# Patient Record
Sex: Female | Born: 1965 | Race: White | Hispanic: No | Marital: Married | Smoking: Never smoker
Health system: Southern US, Community
[De-identification: ages and names within clinical notes are randomized; demographics above are authoritative.]

## PROBLEM LIST (undated history)

## (undated) DIAGNOSIS — Z87442 Personal history of urinary calculi: Secondary | ICD-10-CM

## (undated) DIAGNOSIS — T753XXA Motion sickness, initial encounter: Secondary | ICD-10-CM

## (undated) DIAGNOSIS — O24419 Gestational diabetes mellitus in pregnancy, unspecified control: Secondary | ICD-10-CM

## (undated) DIAGNOSIS — E785 Hyperlipidemia, unspecified: Secondary | ICD-10-CM

## (undated) DIAGNOSIS — H905 Unspecified sensorineural hearing loss: Secondary | ICD-10-CM

## (undated) DIAGNOSIS — N200 Calculus of kidney: Secondary | ICD-10-CM

## (undated) DIAGNOSIS — K5792 Diverticulitis of intestine, part unspecified, without perforation or abscess without bleeding: Secondary | ICD-10-CM

## (undated) DIAGNOSIS — T8859XA Other complications of anesthesia, initial encounter: Secondary | ICD-10-CM

## (undated) DIAGNOSIS — T4145XA Adverse effect of unspecified anesthetic, initial encounter: Secondary | ICD-10-CM

## (undated) DIAGNOSIS — J189 Pneumonia, unspecified organism: Secondary | ICD-10-CM

## (undated) DIAGNOSIS — G43909 Migraine, unspecified, not intractable, without status migrainosus: Secondary | ICD-10-CM

## (undated) DIAGNOSIS — R011 Cardiac murmur, unspecified: Secondary | ICD-10-CM

## (undated) DIAGNOSIS — K760 Fatty (change of) liver, not elsewhere classified: Secondary | ICD-10-CM

## (undated) HISTORY — PX: COLON RESECTION SIGMOID: SHX6737

## (undated) HISTORY — DX: Gestational diabetes mellitus in pregnancy, unspecified control: O24.419

## (undated) HISTORY — PX: TUBAL LIGATION: SHX77

## (undated) HISTORY — DX: Calculus of kidney: N20.0

## (undated) HISTORY — DX: Migraine, unspecified, not intractable, without status migrainosus: G43.909

## (undated) HISTORY — PX: LUMBAR EPIDURAL INJECTION: SHX1980

## (undated) HISTORY — DX: Hyperlipidemia, unspecified: E78.5

---

## 1985-08-23 HISTORY — PX: DIAGNOSTIC LAPAROSCOPY: SUR761

## 1986-08-23 HISTORY — PX: DIAGNOSTIC LAPAROSCOPY: SUR761

## 1996-08-23 HISTORY — PX: SINUS EXPLORATION: SHX5214

## 2002-08-23 HISTORY — PX: COLONOSCOPY: SHX174

## 2005-07-16 ENCOUNTER — Ambulatory Visit: Payer: Self-pay | Admitting: *Deleted

## 2015-04-04 ENCOUNTER — Emergency Department: Payer: Self-pay

## 2015-04-04 ENCOUNTER — Emergency Department
Admission: EM | Admit: 2015-04-04 | Discharge: 2015-04-04 | Disposition: A | Discharge status: Home / Self Care | Payer: Self-pay | Attending: Emergency Medicine | Admitting: Emergency Medicine

## 2015-04-04 ENCOUNTER — Encounter: Payer: Self-pay | Admitting: Emergency Medicine

## 2015-04-04 DIAGNOSIS — K5732 Diverticulitis of large intestine without perforation or abscess without bleeding: Secondary | ICD-10-CM | POA: Insufficient documentation

## 2015-04-04 HISTORY — DX: Diverticulitis of intestine, part unspecified, without perforation or abscess without bleeding: K57.92

## 2015-04-04 LAB — CBC
HEMATOCRIT: 39.8 % (ref 35.0–47.0)
Hemoglobin: 13.5 g/dL (ref 12.0–16.0)
MCH: 30.6 pg (ref 26.0–34.0)
MCHC: 34 g/dL (ref 32.0–36.0)
MCV: 90.1 fL (ref 80.0–100.0)
Platelets: 191 10*3/uL (ref 150–440)
RBC: 4.42 MIL/uL (ref 3.80–5.20)
RDW: 13.2 % (ref 11.5–14.5)
WBC: 10.2 10*3/uL (ref 3.6–11.0)

## 2015-04-04 LAB — COMPREHENSIVE METABOLIC PANEL
ALK PHOS: 50 U/L (ref 38–126)
ALT: 16 U/L (ref 14–54)
ANION GAP: 9 (ref 5–15)
AST: 21 U/L (ref 15–41)
Albumin: 4 g/dL (ref 3.5–5.0)
BUN: 15 mg/dL (ref 6–20)
CHLORIDE: 106 mmol/L (ref 101–111)
CO2: 24 mmol/L (ref 22–32)
Calcium: 9.3 mg/dL (ref 8.9–10.3)
Creatinine, Ser: 0.81 mg/dL (ref 0.44–1.00)
GFR calc Af Amer: >60 mL/min (ref >60)
GFR calc non Af Amer: >60 mL/min (ref >60)
Glucose, Bld: 102 mg/dL — ABNORMAL HIGH (ref 65–99)
Potassium: 4.3 mmol/L (ref 3.5–5.1)
Sodium: 139 mmol/L (ref 135–145)
Total Bilirubin: 0.6 mg/dL (ref 0.3–1.2)
Total Protein: 7.3 g/dL (ref 6.5–8.1)

## 2015-04-04 LAB — LIPASE, BLOOD: Lipase: 29 U/L (ref 22–51)

## 2015-04-04 MED ORDER — METRONIDAZOLE 500 MG PO TABS: 500.0000 mg | ORAL_TABLET | Freq: Two times a day (BID) | ORAL | Status: DC

## 2015-04-04 MED ORDER — MORPHINE SULFATE 4 MG/ML IJ SOLN
4.0000 mg | Freq: Once | INTRAMUSCULAR | Status: AC
  Administered 2015-04-04: 4 mg via INTRAVENOUS
  Filled 2015-04-04: qty 1

## 2015-04-04 MED ORDER — CIPROFLOXACIN HCL 500 MG PO TABS: 500.0000 mg | ORAL_TABLET | Freq: Two times a day (BID) | ORAL | Status: DC

## 2015-04-04 MED ORDER — IOHEXOL 350 MG/ML SOLN
100.0000 mL | Freq: Once | INTRAVENOUS | Status: AC | PRN
  Administered 2015-04-04: 100 mL via INTRAVENOUS

## 2015-04-04 MED ORDER — MORPHINE SULFATE 4 MG/ML IJ SOLN
INTRAMUSCULAR | Status: AC
  Administered 2015-04-04: 4 mg via INTRAVENOUS
  Filled 2015-04-04: qty 1

## 2015-04-04 MED ORDER — IOHEXOL 240 MG/ML SOLN
25.0000 mL | Freq: Once | INTRAMUSCULAR | Status: AC | PRN
  Administered 2015-04-04: 25 mL via ORAL

## 2015-04-04 MED ORDER — SODIUM CHLORIDE 0.9 % IV SOLN
1000.0000 mL | Freq: Once | INTRAVENOUS | Status: AC
  Administered 2015-04-04: 1000 mL via INTRAVENOUS

## 2015-04-04 MED ORDER — OXYCODONE-ACETAMINOPHEN 5-325 MG PO TABS: 1.0000 | ORAL_TABLET | Freq: Four times a day (QID) | ORAL | Status: AC | PRN

## 2015-04-04 MED ORDER — MORPHINE SULFATE 4 MG/ML IJ SOLN
4.0000 mg | Freq: Once | INTRAMUSCULAR | Status: AC
  Administered 2015-04-04: 4 mg via INTRAVENOUS

## 2015-04-04 MED ORDER — ONDANSETRON HCL 4 MG/2ML IJ SOLN
4.0000 mg | Freq: Once | INTRAMUSCULAR | Status: AC
  Administered 2015-04-04: 4 mg via INTRAVENOUS
  Filled 2015-04-04: qty 2

## 2015-04-04 NOTE — ED Notes (Signed)
MD at bedside. 

## 2015-04-04 NOTE — Discharge Instructions (Signed)
Diverticulosis Diverticulosis is the condition that develops when small pouches (diverticula) form in the wall of your colon. Your colon, or large intestine, is where water is absorbed and stool is formed. The pouches form when the inside layer of your colon pushes through weak spots in the outer layers of your colon. CAUSES  No one knows exactly what causes diverticulosis. RISK FACTORS  Being older than 50. Your risk for this condition increases with age. Diverticulosis is rare in people younger than 40 years. By age 80, almost everyone has it.  Eating a low-fiber diet.  Being frequently constipated.  Being overweight.  Not getting enough exercise.  Smoking.  Taking over-the-counter pain medicines, like aspirin and ibuprofen. SYMPTOMS  Most people with diverticulosis do not have symptoms. DIAGNOSIS  Because diverticulosis often has no symptoms, health care providers often discover the condition during an exam for other colon problems. In many cases, a health care provider will diagnose diverticulosis while using a flexible scope to examine the colon (colonoscopy). TREATMENT  If you have never developed an infection related to diverticulosis, you may not need treatment. If you have had an infection before, treatment may include:  Eating more fruits, vegetables, and grains.  Taking a fiber supplement.  Taking a live bacteria supplement (probiotic).  Taking medicine to relax your colon. HOME CARE INSTRUCTIONS   Drink at least 6-8 glasses of water each day to prevent constipation.  Try not to strain when you have a bowel movement.  Keep all follow-up appointments. If you have had an infection before:  Increase the fiber in your diet as directed by your health care provider or dietitian.  Take a dietary fiber supplement if your health care provider approves.  Only take medicines as directed by your health care provider. SEEK MEDICAL CARE IF:   You have abdominal  pain.  You have bloating.  You have cramps.  You have not gone to the bathroom in 3 days. SEEK IMMEDIATE MEDICAL CARE IF:   Your pain gets worse.  Yourbloating becomes very bad.  You have a fever or chills, and your symptoms suddenly get worse.  You begin vomiting.  You have bowel movements that are bloody or black. MAKE SURE YOU:  Understand these instructions.  Will watch your condition.  Will get help right away if you are not doing well or get worse. Document Released: 05/06/2004 Document Revised: 08/14/2013 Document Reviewed: 07/04/2013 ExitCare Patient Information 2015 [employer]. This information is not intended to replace advice given to you by your health care provider. Make sure you discuss any questions you have with your health care provider.  

## 2015-04-04 NOTE — ED Notes (Signed)
Pt to ed with c/o left lower abd pain x several days.  Pt states hx of diverticulitis.  Pt states last BM was today.  Denies n/v.

## 2015-04-04 NOTE — ED Provider Notes (Signed)
[facility] Emergency Department Provider Note  ____________________________________________  Time seen: On arrival  I have reviewed the triage vital signs and the nursing notes.   HISTORY  Chief Complaint Abdominal Pain    HPI Deborah Brandt is a 49 y.o. female who presents with abdominal pain. She reports severe cramping left lower abdominal pain which feels consistent with diverticulitis which she has had the past. Last episode was this was about 7 years ago although she does note that it seemed to flareup in January of this year but improved with antibiotics to some degree.She denies fevers chills. No dysuria. No back pain or flank pain. No nausea no vomiting. Normal bowel movement today.     Past Medical History  Diagnosis Date  . Diverticulitis     There are no active problems to display for this patient.   History reviewed. No pertinent past surgical history.  Current Outpatient Rx  Name  Route  Sig  Dispense  Refill  . ciprofloxacin (CIPRO) 500 MG tablet   Oral   Take 1 tablet (500 mg total) by mouth 2 (two) times daily.   14 tablet   0   . metroNIDAZOLE (FLAGYL) 500 MG tablet   Oral   Take 1 tablet (500 mg total) by mouth 2 (two) times daily after a meal.   14 tablet   0   . oxyCODONE-acetaminophen (ROXICET) 5-325 MG per tablet   Oral   Take 1 tablet by mouth every 6 (six) hours as needed.   30 tablet   0     Allergies Levaquin; Codeine; Doxycycline; Erythromycin; and Sulfa antibiotics  History reviewed. No pertinent family history.  Social History Social History  Substance Use Topics  . Smoking status: Never Smoker   . Smokeless tobacco: None  . Alcohol Use: Yes    Review of Systems  Constitutional: Negative for fever. Eyes: Negative for visual changes. ENT: Negative for sore throat Cardiovascular: Negative for chest pain. Respiratory: Negative for shortness of breath. Gastrointestinal: Positive for abdominal  pain, negative for diarrhea Genitourinary: Negative for dysuria. Musculoskeletal: Negative for back pain. Skin: Negative for rash. Neurological: Negative for headaches Psychiatric: No anxiety    ____________________________________________   PHYSICAL EXAM:  VITAL SIGNS: ED Triage Vitals  Enc Vitals Group     BP 04/04/15 0720 126/88 mmHg     Pulse Rate 04/04/15 0720 100     Resp 04/04/15 0720 20     Temp 04/04/15 0720 98.4 F (36.9 C)     Temp Source 04/04/15 0720 Oral     SpO2 04/04/15 0720 99 %     Weight 04/04/15 0720 180 lb (81.647 kg)     Height 04/04/15 0720 5' (1.524 m)     Head Cir --      Peak Flow --      Pain Score 04/04/15 0721 8     Pain Loc --      Pain Edu? --      Excl. in [place]? --      Constitutional: Alert and oriented. Well appearing and in no distress. Pleasant and interactive Eyes: Conjunctivae are normal.  ENT   Head: Normocephalic and atraumatic.   Mouth/Throat: Mucous membranes are moist. Cardiovascular: Normal rate, regular rhythm. Normal and symmetric distal pulses are present in all extremities.  Respiratory: Normal respiratory effort without tachypnea nor retractions. Breath sounds are clear and equal bilaterally.  Gastrointestinal: Moderate Tenderness to palpation in the left lower quadrant. No distention. There is no  CVA tenderness. Genitourinary: deferred Musculoskeletal: Nontender with normal range of motion in all extremities. No lower extremity tenderness nor edema. Neurologic:  Normal speech and language. No gross focal neurologic deficits are appreciated. Skin:  Skin is warm, dry and intact. No rash noted. Psychiatric: Mood and affect are normal. Patient exhibits appropriate insight and judgment.  ____________________________________________    LABS (pertinent positives/negatives)  Labs Reviewed  COMPREHENSIVE METABOLIC PANEL - Abnormal; Notable for the following:    Glucose, Bld 102 (*)    All other components within  normal limits  CBC  LIPASE, BLOOD    ____________________________________________   EKG  None  ____________________________________________    RADIOLOGY I have personally reviewed any xrays that were ordered on this patient: CT scan shows acute diverticulitis, no abscess or perforation noted  ____________________________________________   PROCEDURES  Procedure(s) performed: none  Critical Care performed: none  ____________________________________________   INITIAL IMPRESSION / ASSESSMENT AND PLAN / ED COURSE  Pertinent labs & imaging results that were available during my care of the patient were reviewed by me and considered in my medical decision making (see chart for details).  Patient with history of present illness and exam consistent with diverticulitis. We will give IV morphine, IV Zofran, normal saline 1 L. We'll check labs. and obtain a CT scan of her abdomen  ----------------------------------------- 10:42 AM on 04/04/2015 -----------------------------------------  Discussed CT results with patient. I suggested admission given her continued pain after IV pain medications but she has refused and wants to go home because she does not have insurance and does not want to get a bill from the hospital. She prefers to try by mouth antibiotics and pain medication. Given her labs and vitals are normal I believe this is a reasonable approach. I discussed return precautions with her at length  ____________________________________________   FINAL CLINICAL IMPRESSION(S) / ED DIAGNOSES  Final diagnoses:  Diverticulitis of large intestine without perforation or abscess without bleeding     Lavonia Drafts, MD 04/04/15 1043

## 2016-11-23 ENCOUNTER — Encounter: Payer: Self-pay | Admitting: Family Medicine

## 2016-11-23 ENCOUNTER — Ambulatory Visit (INDEPENDENT_AMBULATORY_CARE_PROVIDER_SITE_OTHER): Payer: BLUE CROSS/BLUE SHIELD | Admitting: Family Medicine

## 2016-11-23 VITALS — BP 124/80 | HR 90 | Temp 98.7°F | Ht 59.5 in | Wt 203.5 lb

## 2016-11-23 DIAGNOSIS — Z8632 Personal history of gestational diabetes: Secondary | ICD-10-CM

## 2016-11-23 DIAGNOSIS — K5732 Diverticulitis of large intestine without perforation or abscess without bleeding: Secondary | ICD-10-CM | POA: Diagnosis not present

## 2016-11-23 DIAGNOSIS — Z7189 Other specified counseling: Secondary | ICD-10-CM

## 2016-11-23 DIAGNOSIS — Z780 Asymptomatic menopausal state: Secondary | ICD-10-CM

## 2016-11-23 DIAGNOSIS — N200 Calculus of kidney: Secondary | ICD-10-CM

## 2016-11-23 DIAGNOSIS — Z8669 Personal history of other diseases of the nervous system and sense organs: Secondary | ICD-10-CM | POA: Diagnosis not present

## 2016-11-23 DIAGNOSIS — H00019 Hordeolum externum unspecified eye, unspecified eyelid: Secondary | ICD-10-CM

## 2016-11-23 NOTE — Progress Notes (Signed)
New patient.    Requesting records.  Will address health maintenance at that point.  D/w pt.  She agrees.    Skin tags on neck, prev with multiple tags having been removed. She wanted to know we can set this up at some point, for repeated treatment. They tend to get caught on clothing and get irritated.  Left eyelid changes. No vision loss, no discharge, no eye pain. Left lower eyelid laterally irritated. Not responding to over-the-counter drops. No right or eyelid changes.  No fevers chills throwing up diarrhea, etc. She does not feel unwell. No foreign body. No eye trauma.  No dysuria but she has noticed a change in the scent of her urine since she has gone through menopause. She has no discharge. No pain with urination. She previously had some hot flashes but they are improved now.  Multiple episodes of diverticulitis in the past. No current symptoms. She previously required hospitalization twice. Will review old records.  History of kidney stones. Patient was unaware of this; previously seen, nonobstructing, on previous imaging. This was an incidental finding and she has never had symptoms from kidney stones. Discussed with patient.  History of gestational diabetes but no current issues.  She had history of heart murmur in childhood but this is evidently resolve.  History of migraines but no recent symptoms. She has not had significant headaches in years.  Previous testing done for HIV. Negative per patient report.  Advance directive discussed with patient. Boyfriend Clent Demark designated if patient were incapacitated.  PMH and SH reviewed  ROS: Per HPI unless specifically indicated in ROS section   Meds, vitals, and allergies reviewed.   GEN: nad, alert and oriented HEENT: mucous membranes moist NECK: supple w/o LA CV: rrr.  no murmur PULM: ctab, no inc wob ABD: soft, +bs EXT: no edema PERRL EOMI, conjunctiva within normal limits bilaterally. No foreign body seen. Eyelids  within normal limits except for what looks to be a very small stye on the left lower lateral eyelid.

## 2016-11-23 NOTE — Patient Instructions (Signed)
Check with the front about a designated party release.   Warm compresses in the meantime on the left eyelid.  Update me as needed, especially if not better.   We'll get your old records and go from there.  Take care.  Glad to see you.

## 2016-11-23 NOTE — Progress Notes (Signed)
Pre visit review using our clinic review tool, if applicable. No additional management support is needed unless otherwise documented below in the visit note. 

## 2016-11-24 ENCOUNTER — Encounter: Payer: Self-pay | Admitting: Family Medicine

## 2016-11-24 DIAGNOSIS — Z7189 Other specified counseling: Secondary | ICD-10-CM | POA: Insufficient documentation

## 2016-11-24 DIAGNOSIS — K5792 Diverticulitis of intestine, part unspecified, without perforation or abscess without bleeding: Secondary | ICD-10-CM | POA: Insufficient documentation

## 2016-11-24 DIAGNOSIS — Z8669 Personal history of other diseases of the nervous system and sense organs: Secondary | ICD-10-CM | POA: Insufficient documentation

## 2016-11-24 DIAGNOSIS — Z780 Asymptomatic menopausal state: Secondary | ICD-10-CM | POA: Insufficient documentation

## 2016-11-24 DIAGNOSIS — N200 Calculus of kidney: Secondary | ICD-10-CM | POA: Insufficient documentation

## 2016-11-24 DIAGNOSIS — Z8632 Personal history of gestational diabetes: Secondary | ICD-10-CM | POA: Insufficient documentation

## 2016-11-24 DIAGNOSIS — H00019 Hordeolum externum unspecified eye, unspecified eyelid: Secondary | ICD-10-CM | POA: Insufficient documentation

## 2016-11-24 NOTE — Assessment & Plan Note (Signed)
Previously incidentally noted. Discussed with patient about previous findings. No symptoms now. No intervention needed.

## 2016-11-24 NOTE — Assessment & Plan Note (Signed)
I flashes or some better. In the absence no frank dysuria I would not work this up further. She agrees. Update me as needed.

## 2016-11-24 NOTE — Assessment & Plan Note (Signed)
Fortunately no recent symptoms.

## 2016-11-24 NOTE — Assessment & Plan Note (Signed)
History of. Will review old records. No current symptoms

## 2016-11-24 NOTE — Assessment & Plan Note (Signed)
Advance directive discussed with patient. Boyfriend Clent Demark designated if patient were incapacitated.

## 2016-11-24 NOTE — Assessment & Plan Note (Addendum)
Should resolve with warm compresses. I showed patient representative pictures of similar. Should resolve. Update me as needed. She agrees. >30 minutes spent in face to face time with patient, >50% spent in counselling or coordination of care.

## 2016-12-12 ENCOUNTER — Telehealth: Payer: Self-pay | Admitting: Family Medicine

## 2016-12-12 ENCOUNTER — Encounter: Payer: Self-pay | Admitting: Family Medicine

## 2016-12-12 DIAGNOSIS — E785 Hyperlipidemia, unspecified: Secondary | ICD-10-CM

## 2016-12-12 NOTE — Telephone Encounter (Signed)
Notify pt.  Old records from [facility] reviewed.  It doesn't have any labs/pap/health maintenance listed, but it does mention h/o HLD and prev lipitor use.  Would get labs done (ordered) prior to CPE and we'll go from there, when convenient for patient.  Thanks.

## 2016-12-13 NOTE — Telephone Encounter (Signed)
Left detailed message on voicemail.  

## 2016-12-14 NOTE — Telephone Encounter (Signed)
Pt returned your call. She has labs setup 6/12 before cpe. Please call if any questions.

## 2017-01-04 ENCOUNTER — Ambulatory Visit (INDEPENDENT_AMBULATORY_CARE_PROVIDER_SITE_OTHER): Payer: BLUE CROSS/BLUE SHIELD | Admitting: Family Medicine

## 2017-01-04 ENCOUNTER — Encounter: Payer: Self-pay | Admitting: Family Medicine

## 2017-01-04 VITALS — BP 130/76 | HR 85 | Temp 98.7°F | Wt 208.0 lb

## 2017-01-04 DIAGNOSIS — K5792 Diverticulitis of intestine, part unspecified, without perforation or abscess without bleeding: Secondary | ICD-10-CM

## 2017-01-04 LAB — CBC WITH DIFFERENTIAL/PLATELET
BASOS ABS: 0 10*3/uL (ref 0.0–0.1)
Basophils Relative: 0.4 % (ref 0.0–3.0)
Eosinophils Absolute: 0.2 10*3/uL (ref 0.0–0.7)
Eosinophils Relative: 1.8 % (ref 0.0–5.0)
HEMATOCRIT: 40.2 % (ref 36.0–46.0)
Hemoglobin: 13.7 g/dL (ref 12.0–15.0)
LYMPHS PCT: 30.9 % (ref 12.0–46.0)
Lymphs Abs: 2.9 10*3/uL (ref 0.7–4.0)
MCHC: 34 g/dL (ref 30.0–36.0)
MCV: 91.5 fl (ref 78.0–100.0)
MONOS PCT: 7.6 % (ref 3.0–12.0)
Monocytes Absolute: 0.7 10*3/uL (ref 0.1–1.0)
Neutro Abs: 5.5 10*3/uL (ref 1.4–7.7)
Neutrophils Relative %: 59.3 % (ref 43.0–77.0)
PLATELETS: 224 10*3/uL (ref 150.0–400.0)
RBC: 4.39 Mil/uL (ref 3.87–5.11)
RDW: 13.1 % (ref 11.5–15.5)
WBC: 9.3 10*3/uL (ref 4.0–10.5)

## 2017-01-04 MED ORDER — AMOXICILLIN-POT CLAVULANATE 875-125 MG PO TABS: 1.0000 | ORAL_TABLET | Freq: Two times a day (BID) | ORAL | 0 refills | Status: DC

## 2017-01-04 NOTE — Progress Notes (Signed)
H/o mult flares of diverticulitis.  Prev hospitalized for flares.  No sx in the last year.  She was fine until yesterday AM, woke up with sx.  No better today.  Lower abd pain. Feels like prev episodes.  6-7/10.  No fevers but felt hot.  LLQ pain.  No R sided pain.  No vomiting, no diarrhea. No blood in stool.  Normal BM this AM.  No dysuria.  Last UOP was ~30 min ago, normal.  No h/o sx from renal stones.    Prev CT noted from 2016, d/w pt:  IMPRESSION: 1. Acute diverticulitis of the descending colon-sigmoid colon junction. No abcess. Given patients age, recommend colonoscopy following resolution of the patients acute illness if the patient has not already had one. 2. Bilateral nonobstructing nephrolithiasis.  Meds, vitals, and allergies reviewed.   ROS: Per HPI unless specifically indicated in ROS section   nad ncat Mmm Neck supple, no LA rrr ctab abd soft, not ttp except ttp in LLQ w/o rebound, normal BS Ext w/o edema

## 2017-01-04 NOTE — Patient Instructions (Signed)
Go to the lab on the way out.  We'll contact you with your lab report. Start augmentin today.   If severe pain then go to the ER.  Clear liquids for now.  Update me tomorrow.  Take care.  Glad to see you.

## 2017-01-04 NOTE — Assessment & Plan Note (Signed)
Likely dx, check cbc, start augmentin, to ER if worse.  Clear liquid diet, update me re: condition tomorrow.  Still okay for outpatient f/u.  She agrees.

## 2017-01-20 ENCOUNTER — Telehealth: Payer: Self-pay

## 2017-01-20 NOTE — Telephone Encounter (Signed)
Can she come in tomorrow for re evaluation? Let me know if not.

## 2017-01-20 NOTE — Telephone Encounter (Signed)
Pt left v/m; pt seen 01/04/17; pt finished med 1 week ago and pt is still having pain/ pt wants to know what to do next; if pt needs appt she will schedule [email or web address] request cb.

## 2017-01-21 ENCOUNTER — Encounter: Payer: Self-pay | Admitting: Family Medicine

## 2017-01-21 ENCOUNTER — Ambulatory Visit
Admission: RE | Admit: 2017-01-21 | Discharge: 2017-01-21 | Disposition: A | Discharge status: Home / Self Care | Payer: BLUE CROSS/BLUE SHIELD | Source: Ambulatory Visit | Attending: Family Medicine | Admitting: Family Medicine

## 2017-01-21 ENCOUNTER — Ambulatory Visit (INDEPENDENT_AMBULATORY_CARE_PROVIDER_SITE_OTHER): Payer: BLUE CROSS/BLUE SHIELD | Admitting: Family Medicine

## 2017-01-21 ENCOUNTER — Telehealth: Payer: Self-pay | Admitting: Family Medicine

## 2017-01-21 VITALS — BP 130/82 | HR 66 | Temp 98.2°F | Wt 199.0 lb

## 2017-01-21 DIAGNOSIS — N200 Calculus of kidney: Secondary | ICD-10-CM | POA: Diagnosis not present

## 2017-01-21 DIAGNOSIS — K5792 Diverticulitis of intestine, part unspecified, without perforation or abscess without bleeding: Secondary | ICD-10-CM | POA: Insufficient documentation

## 2017-01-21 DIAGNOSIS — R1032 Left lower quadrant pain: Secondary | ICD-10-CM | POA: Diagnosis not present

## 2017-01-21 DIAGNOSIS — I700 Atherosclerosis of aorta: Secondary | ICD-10-CM | POA: Insufficient documentation

## 2017-01-21 MED ORDER — IOPAMIDOL (ISOVUE-300) INJECTION 61%
100.0000 mL | Freq: Once | INTRAVENOUS | Status: AC | PRN
  Administered 2017-01-21: 100 mL via INTRAVENOUS

## 2017-01-21 NOTE — Progress Notes (Signed)
BP 130/82   Pulse 66   Temp 98.2 F (36.8 C) (Oral)   Wt 199 lb (90.3 kg)   SpO2 98%   BMI 39.52 kg/m    CC: f/u abd pain Subjective:    Patient ID: Deborah Brandt, female    DOB: 05-May-1966, 51 y.o.   MRN: [id number]  HPI: Deborah Brandt is a 51 y.o. female presenting on 01/21/2017 for Follow-up   See prior note for details. Seen by Dr Damita Dunnings 01/04/2017 with concern for early diverticulitis, treated with augmentin course. CBC WNL at that time. She did feel better but never fully better. Yesterday had acute flare again - intermittent sharp "rolling" pain LLQ (6-7/10 pain). No fevers, bowel changes, nausea, blood in stool. No urinary symptoms of dysuria, urgency, hematuria.   She has changed diet to bland.  Currently not taking fiber.   H/o prior diverticulitis flares, needing hospitalization in the past.  No h/o kidney stones.  Hive allergy to levaquin.  She has had colonoscopy 6-7 yrs ago at [facility] hospital in [place] after first diverticulitis flrae - states overall normal.   Relevant past medical, surgical, family and social history reviewed and updated as indicated. Interim medical history since our last visit reviewed. Allergies and medications reviewed and updated. Outpatient Medications Prior to Visit  Medication Sig Dispense Refill  . FIBER PO Take by mouth daily.    . Multiple Vitamin (MULTIVITAMIN) tablet Take 1 tablet by mouth daily.    . Probiotic Product (PROBIOTIC DAILY PO) Take by mouth daily.    Marland Kitchen amoxicillin-clavulanate (AUGMENTIN) 875-125 MG tablet Take 1 tablet by mouth 2 (two) times daily. 20 tablet 0   No facility-administered medications prior to visit.      Per HPI unless specifically indicated in ROS section below Review of Systems     Objective:    BP 130/82   Pulse 66   Temp 98.2 F (36.8 C) (Oral)   Wt 199 lb (90.3 kg)   SpO2 98%   BMI 39.52 kg/m   Wt Readings from Last 3 Encounters:  01/21/17 199 lb (90.3 kg)  01/04/17 208 lb (94.3 kg)    11/23/16 203 lb 8 oz (92.3 kg)    Physical Exam  Constitutional: She appears well-developed and well-nourished. No distress.  HENT:  Mouth/Throat: Oropharynx is clear and moist. No oropharyngeal exudate.  Cardiovascular: Normal rate, regular rhythm, normal heart sounds and intact distal pulses.   No murmur heard. Pulmonary/Chest: Effort normal and breath sounds normal. No respiratory distress. She has no wheezes. She has no rales.  Abdominal: Soft. Normal appearance and bowel sounds are normal. She exhibits no distension and no mass. There is no hepatosplenomegaly. There is tenderness (mild-mod) in the epigastric area, suprapubic area and left lower quadrant. There is CVA tenderness (mild L flank). There is no rigidity, no rebound, no guarding and negative Murphy's sign.  Skin: Skin is warm and dry. No rash noted.  Psychiatric: She has a normal mood and affect.  Nursing note and vitals reviewed.  Results for orders placed or performed in visit on 01/04/17  CBC with Differential/Platelet  Result Value Ref Range   WBC 9.3 4.0 - 10.5 K/uL   RBC 4.39 3.87 - 5.11 Mil/uL   Hemoglobin 13.7 12.0 - 15.0 g/dL   HCT 40.2 36.0 - 46.0 %   MCV 91.5 78.0 - 100.0 fl   MCHC 34.0 30.0 - 36.0 g/dL   RDW 13.1 11.5 - 15.5 %   Platelets 224.0 150.0 -  400.0 K/uL   Neutrophils Relative % 59.3 43.0 - 77.0 %   Lymphocytes Relative 30.9 12.0 - 46.0 %   Monocytes Relative 7.6 3.0 - 12.0 %   Eosinophils Relative 1.8 0.0 - 5.0 %   Basophils Relative 0.4 0.0 - 3.0 %   Neutro Abs 5.5 1.4 - 7.7 K/uL   Lymphs Abs 2.9 0.7 - 4.0 K/uL   Monocytes Absolute 0.7 0.1 - 1.0 K/uL   Eosinophils Absolute 0.2 0.0 - 0.7 K/uL   Basophils Absolute 0.0 0.0 - 0.1 K/uL      Assessment & Plan:   Problem List Items Addressed This Visit    Abdominal pain, LLQ - Primary    Treated for diverticulitis 2 wks ago in h/o same (10d augmentin course). Some improvement, never fully better. Acute worsening again yesterday - predominant  LLQ pain, no fever or bowel changes. Reviewed prior note and labs. Will further evaluate today with STAT CT scan and call with results. Pt agrees with plan. Discussed bland low fiber diet for now.       Relevant Orders   CT Abdomen Pelvis W Contrast       Follow up plan: Return if symptoms worsen or fail to improve.  Ria Bush, MD

## 2017-01-21 NOTE — Telephone Encounter (Signed)
Spoke to pt; placed on sched for 1000

## 2017-01-21 NOTE — Assessment & Plan Note (Signed)
Treated for diverticulitis 2 wks ago in h/o same (10d augmentin course). Some improvement, never fully better. Acute worsening again yesterday - predominant LLQ pain, no fever or bowel changes. Reviewed prior note and labs. Will further evaluate today with STAT CT scan and call with results. Pt agrees with plan. Discussed bland low fiber diet for now.

## 2017-01-21 NOTE — Telephone Encounter (Signed)
Patient called and wanted to know if antibiotics are going to be called in and the results of her CT.

## 2017-01-21 NOTE — Telephone Encounter (Signed)
Spoke with patient. No signs if diverticulitis. She did have nonobstructing kidney stones.  rec bland diet over weekend, update [place] if persistent pain into next week, would consider rpt abx course.  Will cc PCP as fyi.

## 2017-01-21 NOTE — Patient Instructions (Addendum)
I think we need to check CT scan - see Rosaria Ferries on your way out to schedule this today.  We will be in touch with results and plan.

## 2017-01-23 NOTE — Telephone Encounter (Signed)
Agreed, please get update on patient.   What about having her see GI?   Let me know.  Thanks.

## 2017-01-24 ENCOUNTER — Telehealth: Payer: Self-pay

## 2017-01-24 NOTE — Telephone Encounter (Signed)
Noted. Thanks.

## 2017-01-24 NOTE — Telephone Encounter (Signed)
PLEASE NOTE: All timestamps contained within this report are represented as [place] Standard Time. CONFIDENTIALTY NOTICE: This fax transmission is intended only for the addressee. It contains information that is legally privileged, confidential or otherwise protected from use or disclosure. If you are not the intended recipient, you are strictly prohibited from reviewing, disclosing, copying using or disseminating any of this information or taking any action in reliance on or regarding this information. If you have received this fax in error, please notify us immediately by telephone so that we can arrange for its return to [place]. Phone: [phone], Toll-Free: [phone], Fax: [phone] Page: 1 of 1 Call Id: [id number] [place] Patient Name: Deborah Brandt Gender: Unknown DOB: April 22, 1966 Age: 51 Y 5 D Return Phone Number: City/State/Zip: [place] Client [place] - Client Client Site [place] Physician Ria Bush - MD Who Is Calling Lab Lab Name [facility] Lab Phone Number [phone] Lab Tech Name Ria Comment Lab Reference Number Chief Complaint Lab Result (Critical or Stat) Call Type Lab Send to RN Reason for Call Report lab results Initial Comment Stat lab results- Stat CT lab results Nurse Assessment Guidelines Guideline Title Affirmed Question Disp. Time Eilene Ghazi Time) Disposition Final User 01/21/2017 5:52:00 PM State Not Covered - Message Only Yes Duard Brady

## 2017-01-24 NOTE — Telephone Encounter (Signed)
See 01/21/17 phone note.

## 2017-01-24 NOTE — Telephone Encounter (Signed)
Patient says she is doing much better today, doesn't think she needs GI appt.  Patient thanks you for checking on her.

## 2017-02-01 ENCOUNTER — Other Ambulatory Visit (INDEPENDENT_AMBULATORY_CARE_PROVIDER_SITE_OTHER): Payer: BLUE CROSS/BLUE SHIELD

## 2017-02-01 DIAGNOSIS — E785 Hyperlipidemia, unspecified: Secondary | ICD-10-CM | POA: Diagnosis not present

## 2017-02-01 LAB — LIPID PANEL
Cholesterol: 217 mg/dL — ABNORMAL HIGH (ref 0–200)
HDL: 39.5 mg/dL (ref >39.00)
LDL Cholesterol: 140 mg/dL — ABNORMAL HIGH (ref 0–99)
NonHDL: 177.71
Total CHOL/HDL Ratio: 5
Triglycerides: 191 mg/dL — ABNORMAL HIGH (ref 0.0–149.0)
VLDL: 38.2 mg/dL (ref 0.0–40.0)

## 2017-02-01 LAB — COMPREHENSIVE METABOLIC PANEL
ALBUMIN: 4.3 g/dL (ref 3.5–5.2)
ALK PHOS: 51 U/L (ref 39–117)
ALT: 22 U/L (ref 0–35)
AST: 15 U/L (ref 0–37)
BUN: 14 mg/dL (ref 6–23)
CO2: 30 mEq/L (ref 19–32)
Calcium: 9.8 mg/dL (ref 8.4–10.5)
Chloride: 104 mEq/L (ref 96–112)
Creatinine, Ser: 0.84 mg/dL (ref 0.40–1.20)
GFR: 75.66 mL/min (ref >60.00)
Glucose, Bld: 102 mg/dL — ABNORMAL HIGH (ref 70–99)
POTASSIUM: 4.1 meq/L (ref 3.5–5.1)
Sodium: 138 mEq/L (ref 135–145)
Total Bilirubin: 0.3 mg/dL (ref 0.2–1.2)
Total Protein: 7 g/dL (ref 6.0–8.3)

## 2017-02-07 ENCOUNTER — Other Ambulatory Visit (HOSPITAL_COMMUNITY)
Admission: RE | Admit: 2017-02-07 | Discharge: 2017-02-07 | Disposition: A | Discharge status: Home / Self Care | Payer: BLUE CROSS/BLUE SHIELD | Source: Ambulatory Visit | Attending: Family Medicine | Admitting: Family Medicine

## 2017-02-07 ENCOUNTER — Encounter: Payer: Self-pay | Admitting: Family Medicine

## 2017-02-07 ENCOUNTER — Ambulatory Visit (INDEPENDENT_AMBULATORY_CARE_PROVIDER_SITE_OTHER): Payer: BLUE CROSS/BLUE SHIELD | Admitting: Family Medicine

## 2017-02-07 VITALS — BP 116/84 | HR 75 | Temp 98.6°F | Ht 60.0 in | Wt 200.5 lb

## 2017-02-07 DIAGNOSIS — Z23 Encounter for immunization: Secondary | ICD-10-CM | POA: Diagnosis not present

## 2017-02-07 DIAGNOSIS — Z0000 Encounter for general adult medical examination without abnormal findings: Secondary | ICD-10-CM

## 2017-02-07 DIAGNOSIS — Z124 Encounter for screening for malignant neoplasm of cervix: Secondary | ICD-10-CM | POA: Insufficient documentation

## 2017-02-07 DIAGNOSIS — Z8719 Personal history of other diseases of the digestive system: Secondary | ICD-10-CM

## 2017-02-07 NOTE — Progress Notes (Signed)
CPE- See plan.  Routine anticipatory guidance given to patient.  See health maintenance.  The possibility exists that previously documented standard health maintenance information may have been brought forward from a previous encounter into this note.  If needed, that same information has been updated to reflect the current situation based on today's encounter.    Tetanus 2018 Flu d/w pt.  Encouraged.   PNA not due, d/w pt.  Shingles not due, d/w pt.  Prev diverticulitis episode resolved.  D/w pt about GI referral.   Mammogram d/w pt.  She'll call.  See AVS.  Pap due.  D/w pt.  H/o abnormal pap in distant past.  With mult normals since then.   DXA not due.   Living will d/w pt. Boyfriend Clent Demark designated if patient were incapacitated. Diet and exercise.  D/w pt.  "I could do better."  She has been working on diet.  She has been walking more.   Minimal inc in sugar and lipids, d/w pt.  tx is diet and exercise.    PMH and SH reviewed  Meds, vitals, and allergies reviewed.   ROS: Per HPI.  Unless specifically indicated otherwise in HPI, the patient denies:  General: fever. Eyes: acute vision changes ENT: sore throat Cardiovascular: chest pain Respiratory: SOB GI: vomiting GU: dysuria Musculoskeletal: acute back pain Derm: acute rash Neuro: acute motor dysfunction Psych: worsening mood Endocrine: polydipsia Heme: bleeding Allergy: hayfever  GEN: nad, alert and oriented HEENT: mucous membranes moist NECK: supple w/o LA CV: rrr. PULM: ctab, no inc wob ABD: soft, +bs EXT: no edema SKIN: no acute rash Normal introitus for age, no external lesions, no vaginal discharge, mucosa pink and moist, no vaginal or cervical lesions, no vaginal atrophy, no friaility or hemorrhage, normal uterus size and position, no adnexal masses or tenderness.  Chaperoned exam.  Pap collected.

## 2017-02-07 NOTE — Patient Instructions (Addendum)
I would get a flu shot each fall (around October).   Rosaria Ferries will call about your referral.  See her on the way out.   You can call for a mammogram at [facility] at [facility].  [place]  We'll contact you with your pap report. Take care.  Glad to see you.  Update me as needed.

## 2017-02-08 DIAGNOSIS — Z0000 Encounter for general adult medical examination without abnormal findings: Secondary | ICD-10-CM | POA: Insufficient documentation

## 2017-02-08 NOTE — Assessment & Plan Note (Signed)
Tetanus 2018 Flu d/w pt.  Encouraged.   PNA not due, d/w pt.  Shingles not due, d/w pt.  Prev diverticulitis episode resolved.  D/w pt about GI referral.   Mammogram d/w pt.  She'll call.  See AVS.  Pap due.  D/w pt.  H/o abnormal pap in distant past.  With mult normals since then.   DXA not due.   Living will d/w pt. Boyfriend Clent Demark designated if patient were incapacitated. Diet and exercise.  D/w pt.  "I could do better."  She has been working on diet.  She has been walking more.   Minimal inc in sugar and lipids, d/w pt.  tx is diet and exercise.

## 2017-02-11 LAB — CYTOLOGY - PAP
Diagnosis: UNDETERMINED — AB
HPV: NOT DETECTED

## 2017-02-21 DIAGNOSIS — D239 Other benign neoplasm of skin, unspecified: Secondary | ICD-10-CM

## 2017-02-21 HISTORY — DX: Other benign neoplasm of skin, unspecified: D23.9

## 2017-02-22 DIAGNOSIS — C439 Malignant melanoma of skin, unspecified: Secondary | ICD-10-CM

## 2017-02-22 HISTORY — DX: Malignant melanoma of skin, unspecified: C43.9

## 2017-03-28 ENCOUNTER — Ambulatory Visit: Payer: BLUE CROSS/BLUE SHIELD | Admitting: Gastroenterology

## 2017-04-11 ENCOUNTER — Telehealth: Payer: Self-pay | Admitting: Family Medicine

## 2017-04-11 DIAGNOSIS — L989 Disorder of the skin and subcutaneous tissue, unspecified: Secondary | ICD-10-CM

## 2017-04-11 NOTE — Telephone Encounter (Signed)
Pt called to request referral for dermatology. She said she has several skin tags and a bump on her chest that is causing concern. She said you were aware.

## 2017-04-12 NOTE — Telephone Encounter (Signed)
Ordered. Thanks

## 2017-06-27 ENCOUNTER — Ambulatory Visit: Payer: BLUE CROSS/BLUE SHIELD | Admitting: Gastroenterology

## 2017-06-30 ENCOUNTER — Other Ambulatory Visit: Payer: Self-pay | Admitting: Family Medicine

## 2017-06-30 DIAGNOSIS — Z1231 Encounter for screening mammogram for malignant neoplasm of breast: Secondary | ICD-10-CM

## 2017-07-28 ENCOUNTER — Ambulatory Visit
Admission: RE | Admit: 2017-07-28 | Discharge: 2017-07-28 | Disposition: A | Discharge status: Home / Self Care | Payer: BLUE CROSS/BLUE SHIELD | Source: Ambulatory Visit | Attending: Family Medicine | Admitting: Family Medicine

## 2017-07-28 DIAGNOSIS — Z1231 Encounter for screening mammogram for malignant neoplasm of breast: Secondary | ICD-10-CM | POA: Diagnosis not present

## 2017-07-28 DIAGNOSIS — R928 Other abnormal and inconclusive findings on diagnostic imaging of breast: Secondary | ICD-10-CM | POA: Diagnosis not present

## 2017-08-03 ENCOUNTER — Other Ambulatory Visit: Payer: Self-pay | Admitting: *Deleted

## 2017-08-03 ENCOUNTER — Inpatient Hospital Stay
Admission: RE | Admit: 2017-08-03 | Discharge: 2017-08-03 | Disposition: A | Discharge status: Home / Self Care | Payer: Self-pay | Source: Ambulatory Visit | Attending: *Deleted | Admitting: *Deleted

## 2017-08-03 DIAGNOSIS — Z9289 Personal history of other medical treatment: Secondary | ICD-10-CM

## 2017-08-04 ENCOUNTER — Other Ambulatory Visit: Payer: Self-pay | Admitting: Family Medicine

## 2017-08-04 DIAGNOSIS — R928 Other abnormal and inconclusive findings on diagnostic imaging of breast: Secondary | ICD-10-CM

## 2017-08-04 DIAGNOSIS — N6320 Unspecified lump in the left breast, unspecified quadrant: Secondary | ICD-10-CM

## 2017-08-11 ENCOUNTER — Ambulatory Visit
Admission: RE | Admit: 2017-08-11 | Discharge: 2017-08-11 | Disposition: A | Discharge status: Home / Self Care | Payer: BLUE CROSS/BLUE SHIELD | Source: Ambulatory Visit | Attending: Family Medicine | Admitting: Family Medicine

## 2017-08-11 DIAGNOSIS — R928 Other abnormal and inconclusive findings on diagnostic imaging of breast: Secondary | ICD-10-CM

## 2017-08-11 DIAGNOSIS — N6324 Unspecified lump in the left breast, lower inner quadrant: Secondary | ICD-10-CM | POA: Insufficient documentation

## 2017-08-11 DIAGNOSIS — N6320 Unspecified lump in the left breast, unspecified quadrant: Secondary | ICD-10-CM

## 2017-08-12 ENCOUNTER — Other Ambulatory Visit: Payer: Self-pay | Admitting: Family Medicine

## 2017-08-12 DIAGNOSIS — R928 Other abnormal and inconclusive findings on diagnostic imaging of breast: Secondary | ICD-10-CM

## 2017-08-12 DIAGNOSIS — N6320 Unspecified lump in the left breast, unspecified quadrant: Secondary | ICD-10-CM

## 2017-08-22 ENCOUNTER — Ambulatory Visit
Admission: RE | Admit: 2017-08-22 | Discharge: 2017-08-22 | Disposition: A | Discharge status: Home / Self Care | Payer: BLUE CROSS/BLUE SHIELD | Source: Ambulatory Visit | Attending: Family Medicine | Admitting: Family Medicine

## 2017-08-22 DIAGNOSIS — R928 Other abnormal and inconclusive findings on diagnostic imaging of breast: Secondary | ICD-10-CM | POA: Diagnosis present

## 2017-08-22 DIAGNOSIS — N6320 Unspecified lump in the left breast, unspecified quadrant: Secondary | ICD-10-CM | POA: Diagnosis present

## 2017-08-22 DIAGNOSIS — N6324 Unspecified lump in the left breast, lower inner quadrant: Secondary | ICD-10-CM | POA: Insufficient documentation

## 2017-08-22 DIAGNOSIS — Z9889 Other specified postprocedural states: Secondary | ICD-10-CM | POA: Diagnosis not present

## 2017-08-22 HISTORY — PX: BREAST BIOPSY: SHX20

## 2017-08-24 LAB — SURGICAL PATHOLOGY

## 2017-08-25 ENCOUNTER — Ambulatory Visit: Payer: BLUE CROSS/BLUE SHIELD

## 2017-09-09 ENCOUNTER — Other Ambulatory Visit: Payer: Self-pay

## 2017-09-09 ENCOUNTER — Emergency Department
Admission: EM | Admit: 2017-09-09 | Discharge: 2017-09-09 | Disposition: A | Discharge status: Home / Self Care | Payer: Managed Care, Other (non HMO) | Attending: Emergency Medicine | Admitting: Emergency Medicine

## 2017-09-09 ENCOUNTER — Encounter: Payer: Self-pay | Admitting: Emergency Medicine

## 2017-09-09 ENCOUNTER — Ambulatory Visit: Payer: Self-pay

## 2017-09-09 ENCOUNTER — Emergency Department: Payer: Managed Care, Other (non HMO)

## 2017-09-09 DIAGNOSIS — N390 Urinary tract infection, site not specified: Secondary | ICD-10-CM | POA: Diagnosis not present

## 2017-09-09 DIAGNOSIS — R1011 Right upper quadrant pain: Secondary | ICD-10-CM | POA: Diagnosis present

## 2017-09-09 DIAGNOSIS — K5792 Diverticulitis of intestine, part unspecified, without perforation or abscess without bleeding: Secondary | ICD-10-CM | POA: Insufficient documentation

## 2017-09-09 LAB — CBC
HEMATOCRIT: 39.7 % (ref 35.0–47.0)
HEMOGLOBIN: 13.6 g/dL (ref 12.0–16.0)
MCH: 31 pg (ref 26.0–34.0)
MCHC: 34.2 g/dL (ref 32.0–36.0)
MCV: 90.7 fL (ref 80.0–100.0)
Platelets: 202 10*3/uL (ref 150–440)
RBC: 4.38 MIL/uL (ref 3.80–5.20)
RDW: 13.3 % (ref 11.5–14.5)
WBC: 16.2 10*3/uL — AB (ref 3.6–11.0)

## 2017-09-09 LAB — URINALYSIS, COMPLETE (UACMP) WITH MICROSCOPIC
Bilirubin Urine: NEGATIVE
GLUCOSE, UA: NEGATIVE mg/dL
Ketones, ur: NEGATIVE mg/dL
NITRITE: NEGATIVE
PROTEIN: NEGATIVE mg/dL
Specific Gravity, Urine: 1.009 (ref 1.005–1.030)
pH: 7 (ref 5.0–8.0)

## 2017-09-09 LAB — COMPREHENSIVE METABOLIC PANEL
ALT: 24 U/L (ref 14–54)
ANION GAP: 12 (ref 5–15)
AST: 25 U/L (ref 15–41)
Albumin: 4 g/dL (ref 3.5–5.0)
Alkaline Phosphatase: 53 U/L (ref 38–126)
BUN: 10 mg/dL (ref 6–20)
CHLORIDE: 101 mmol/L (ref 101–111)
CO2: 20 mmol/L — ABNORMAL LOW (ref 22–32)
Calcium: 9.4 mg/dL (ref 8.9–10.3)
Creatinine, Ser: 0.83 mg/dL (ref 0.44–1.00)
Glucose, Bld: 127 mg/dL — ABNORMAL HIGH (ref 65–99)
POTASSIUM: 4 mmol/L (ref 3.5–5.1)
Sodium: 133 mmol/L — ABNORMAL LOW (ref 135–145)
TOTAL PROTEIN: 7.6 g/dL (ref 6.5–8.1)
Total Bilirubin: 1.3 mg/dL — ABNORMAL HIGH (ref 0.3–1.2)

## 2017-09-09 LAB — LIPASE, BLOOD: LIPASE: 27 U/L (ref 11–51)

## 2017-09-09 MED ORDER — MORPHINE SULFATE (PF) 4 MG/ML IV SOLN
4.0000 mg | Freq: Once | INTRAVENOUS | Status: AC
  Administered 2017-09-09: 4 mg via INTRAVENOUS
  Filled 2017-09-09: qty 1

## 2017-09-09 MED ORDER — SODIUM CHLORIDE 0.9 % IV BOLUS (SEPSIS)
1000.0000 mL | Freq: Once | INTRAVENOUS | Status: AC
  Administered 2017-09-09: 1000 mL via INTRAVENOUS

## 2017-09-09 MED ORDER — DIPHENHYDRAMINE HCL 50 MG/ML IJ SOLN
25.0000 mg | Freq: Once | INTRAMUSCULAR | Status: AC
  Administered 2017-09-09: 25 mg via INTRAVENOUS

## 2017-09-09 MED ORDER — DIPHENHYDRAMINE HCL 50 MG/ML IJ SOLN
INTRAMUSCULAR | Status: AC
  Administered 2017-09-09: 25 mg via INTRAVENOUS
  Filled 2017-09-09: qty 1

## 2017-09-09 MED ORDER — PIPERACILLIN-TAZOBACTAM 3.375 G IVPB 30 MIN
3.3750 g | Freq: Once | INTRAVENOUS | Status: AC
Start: 2017-09-09 — End: 2017-09-09
  Administered 2017-09-09: 3.375 g via INTRAVENOUS
  Filled 2017-09-09: qty 50

## 2017-09-09 MED ORDER — AMOXICILLIN-POT CLAVULANATE 875-125 MG PO TABS: 1.0000 | ORAL_TABLET | Freq: Two times a day (BID) | ORAL | 0 refills | Status: DC

## 2017-09-09 MED ORDER — CIPROFLOXACIN IN D5W 400 MG/200ML IV SOLN
400.0000 mg | Freq: Once | INTRAVENOUS | Status: DC
  Administered 2017-09-09: 400 mg via INTRAVENOUS
  Filled 2017-09-09: qty 200

## 2017-09-09 MED ORDER — TRAMADOL HCL 50 MG PO TABS: 50.0000 mg | ORAL_TABLET | Freq: Four times a day (QID) | ORAL | 0 refills | Status: AC | PRN

## 2017-09-09 MED ORDER — IOPAMIDOL (ISOVUE-300) INJECTION 61%
100.0000 mL | Freq: Once | INTRAVENOUS | Status: AC | PRN
  Administered 2017-09-09: 100 mL via INTRAVENOUS

## 2017-09-09 MED ORDER — IOPAMIDOL (ISOVUE-300) INJECTION 61%
30.0000 mL | Freq: Once | INTRAVENOUS | Status: AC | PRN
  Administered 2017-09-09: 30 mL via ORAL

## 2017-09-09 MED ORDER — METRONIDAZOLE IN NACL 5-0.79 MG/ML-% IV SOLN
500.0000 mg | Freq: Once | INTRAVENOUS | Status: DC
  Filled 2017-09-09: qty 100

## 2017-09-09 NOTE — ED Triage Notes (Signed)
Pt to ED via POV c/o abdominal pain. Pt states that she has hx/o diverticulitis and had a flare up in December. Pt was treated with antibiotics but states that it never completely cleared up. Pt states that she has had abdominal pain for 3 days. Pt in NAD at this time.

## 2017-09-09 NOTE — ED Provider Notes (Signed)
[facility] Emergency Department Provider Note ____________________________________________   First MD Initiated Contact with Patient 09/09/17 [phone]     (approximate)  I have reviewed the triage vital signs and the nursing notes.   HISTORY  Chief Complaint Abdominal Pain    HPI Deborah Brandt is a 52 y.o. female with past medical history as noted below including prior history of diverticulitis who presents with left mid abdominal pain over the last 4 days, gradual onset, intermittent in course, and identical to prior pain from diverticulitis.  She reports some associated nausea but no vomiting, and denies diarrhea or blood in the stool.  She states her stools have been normal.  She denies any urinary symptoms.  The patient states that approximately 3 weeks ago she was seen at urgent care and diagnosed clinically with a diverticulitis flare, and started on a 10-day course of antibiotics.  She states she completed it and the symptoms had resolved but now have returned.  Past Medical History:  Diagnosis Date  . Diverticulitis   . HLD (hyperlipidemia)   . Migraine   . Renal stones     Patient Active Problem List   Diagnosis Date Noted  . Routine general medical examination at a health care facility 02/08/2017  . Abdominal pain, LLQ 01/21/2017  . History of gestational diabetes 11/24/2016  . History of migraine 11/24/2016  . Renal stones 11/24/2016  . Diverticulitis 11/24/2016  . Menopause 11/24/2016  . Advance care planning 11/24/2016    Past Surgical History:  Procedure Laterality Date  . BREAST BIOPSY Left 08/22/2017  . SINUS EXPLORATION      Prior to Admission medications   Medication Sig Start Date End Date Taking? Authorizing Provider  acetaminophen (TYLENOL) 500 MG tablet Take 500-1,000 mg by mouth every 6 (six) hours as needed.   Yes [provider]  amoxicillin-clavulanate (AUGMENTIN) 875-125 MG tablet Take 1 tablet by mouth 2  (two) times daily for 14 days. 09/09/17 09/23/17  Arta Silence, MD  traMADol (ULTRAM) 50 MG tablet Take 1 tablet (50 mg total) by mouth every 6 (six) hours as needed for up to 5 days for severe pain. 09/09/17 09/14/17  Arta Silence, MD    Allergies Levaquin [levofloxacin]; Codeine; Doxycycline; Erythromycin; and Sulfa antibiotics  Family History  Problem Relation Age of Onset  . Breast cancer Mother 7  . Diabetes Father   . Colon cancer Paternal Grandmother        dx'd at age 6  . Diabetes Paternal Grandmother     Social History Social History   Tobacco Use  . Smoking status: Never Smoker  . Smokeless tobacco: Never Used  Substance Use Topics  . Alcohol use: Yes    Comment: 1 beer a month or less.   . Drug use: No    Review of Systems  Constitutional: No fever. Eyes: No redness. ENT: No sore throat. Cardiovascular: Denies chest pain. Respiratory: Denies shortness of breath. Gastrointestinal: Positive for nausea, no vomiting..  Genitourinary: Negative for dysuria. Musculoskeletal: Negative for back pain. Skin: Negative for rash. Neurological: Negative for headache.   ____________________________________________   PHYSICAL EXAM:  VITAL SIGNS: ED Triage Vitals  Enc Vitals Group     BP 09/09/17 0905 119/67     Pulse Rate 09/09/17 0902 (!) 113     Resp 09/09/17 0902 16     Temp 09/09/17 0902 99.5 F (37.5 C)     Temp Source 09/09/17 0902 Oral     SpO2 09/09/17 0902  96 %     Weight 09/09/17 0904 200 lb (90.7 kg)     Height 09/09/17 0904 5' (1.524 m)     Head Circumference --      Peak Flow --      Pain Score 09/09/17 0901 8     Pain Loc --      Pain Edu? --      Excl. in [place]? --     Constitutional: Alert and oriented.  Relatively well appearing and in no acute distress. Eyes: Conjunctivae are normal.  No scleral icterus. Head: Atraumatic. Nose: No congestion/rhinnorhea. Mouth/Throat: Mucous membranes are moist.   Neck: Normal range of  motion.  Cardiovascular:  Good peripheral circulation. Respiratory: Normal respiratory effort.   Gastrointestinal: Soft with moderate left mid abdominal tenderness. No distention.  Genitourinary: No flank tenderness. Musculoskeletal: No lower extremity edema.  Extremities warm and well perfused.  Neurologic:  Normal speech and language. No gross focal neurologic deficits are appreciated.  Skin:  Skin is warm and dry. No rash noted. Psychiatric: Mood and affect are normal. Speech and behavior are normal.  ____________________________________________   LABS (all labs ordered are listed, but only abnormal results are displayed)  Labs Reviewed  COMPREHENSIVE METABOLIC PANEL - Abnormal; Notable for the following components:      Result Value   Sodium 133 (*)    CO2 20 (*)    Glucose, Bld 127 (*)    Total Bilirubin 1.3 (*)    All other components within normal limits  CBC - Abnormal; Notable for the following components:   WBC 16.2 (*)    All other components within normal limits  URINALYSIS, COMPLETE (UACMP) WITH MICROSCOPIC - Abnormal; Notable for the following components:   Color, Urine YELLOW (*)    APPearance CLOUDY (*)    Hgb urine dipstick SMALL (*)    Leukocytes, UA SMALL (*)    Bacteria, UA FEW (*)    Squamous Epithelial / LPF TOO NUMEROUS TO COUNT (*)    All other components within normal limits  LIPASE, BLOOD   ____________________________________________  EKG   ____________________________________________  RADIOLOGY  CT abdomen: Descending colonic diverticulitis with no evidence of abscess or perforation  ____________________________________________   PROCEDURES  Procedure(s) performed: No    Critical Care performed: No ____________________________________________   INITIAL IMPRESSION / ASSESSMENT AND PLAN / ED COURSE  Pertinent labs & imaging results that were available during my care of the patient were reviewed by me and considered in my medical  decision making (see chart for details).  52 year old female with past medical history as noted above presents with recurrent left mid abdominal pain that feels similar to prior diverticulitis.  Patient was diagnosed in late December with diverticulitis with no imaging at that time and completed a 10-day course of antibiotics with improvement in her symptoms, but over the last 4 days the pain has returned.  Past medical records reviewed in epic and are noncontributory.  On exam, the patient is relatively well-appearing, she is mildly tachycardic and has a borderline temperature but the other vital signs are normal.  She has moderate tenderness in the left mid abdomen.  Differential includes recurrent or incompletely treated diverticulitis, colitis, abscess, or other cause such as UTI/pyelo.  Plan: Basic and hepatobiliary labs, UA, CT abdomen, fluids, analgesia, and reassess.  ----------------------------------------- 11:56 AM on 09/09/2017 -----------------------------------------  CT reveals findings consistent with uncomplicated diverticulitis.  Given that patient had improvement in symptoms after the initial course of antibiotics I believe  that this is most likely due to an incompletely treated infection.  Patient has multiple antibiotic allergies.  She states she gets hives with Levaquin, but has never received Cipro or Flagyl.  She was treated with Augmentin which is a second line treatment for the diverticulitis.  After discussion with the patient, although there is some risk for possible cross-reactivity, since that she has no history of anaphylaxis and only had hives with the Levaquin, I believe it is reasonable to give the patient a dose of IV Cipro and Flagyl here while observing her, since this would be the ideal treatment going forward.  The patient agrees with this plan.  If patient has no evidence of allergic reaction then I will send her home on p.o. versions of these.  If she does have  any reaction then we will switch to a different agent.  Patient's urinalysis is also consistent with possible infection.  Again for this reason I would like to be able to give Cipro to cover both if possible, rather than suboptimal course of Augmentin plus another agent to treat the potential UTI.  ----------------------------------------- 2:40 PM on 09/09/2017 -----------------------------------------  Patient did develop mild localized hives during the infusion of the Cipro so was stopped immediately and the patient was given Benadryl.  The symptoms resolved.  She had no discomfort in her throat or any other symptoms of anaphylaxis.  Given this I will revert to Augmentin as patient was previously treated with for both the diverticulitis and the potential UTI.  Patient agrees with this plan and feels well to go home.  A dose of IV Zosyn was given in the ED.  Return precautions given and the patient expresses understanding.  She states she had a referral made already to a gastroenterologist but had to cancel the appointment.  She states she will reschedule.  ____________________________________________   FINAL CLINICAL IMPRESSION(S) / ED DIAGNOSES  Final diagnoses:  Diverticulitis  Lower urinary tract infectious disease      NEW MEDICATIONS STARTED DURING THIS VISIT:  New Prescriptions   AMOXICILLIN-CLAVULANATE (AUGMENTIN) 875-125 MG TABLET    Take 1 tablet by mouth 2 (two) times daily for 14 days.   TRAMADOL (ULTRAM) 50 MG TABLET    Take 1 tablet (50 mg total) by mouth every 6 (six) hours as needed for up to 5 days for severe pain.     Note:  This document was prepared using Dragon voice recognition software and may include unintentional dictation errors.    Arta Silence, MD 09/09/17 [phone]

## 2017-09-09 NOTE — ED Notes (Signed)
Pt started developing itching red hives to anterior right forearm.  VORB given for 25mg  benadryl.

## 2017-09-09 NOTE — Telephone Encounter (Signed)
   Reason for Disposition . Patient sounds very sick or weak to the triager  Answer Assessment - Initial Assessment Questions 1. LOCATION: "Where does it hurt?"      Left lower abd. 2. RADIATION: "Does the pain shoot anywhere else?" (e.g., chest, back)     No 3. ONSET: "When did the pain begin?" (e.g., minutes, hours or days ago)      Started 5 days ago 4. SUDDEN: "Gradual or sudden onset?"     Gradual 5. PATTERN "Does the pain come and go, or is it constant?"    - If constant: "Is it getting better, staying the same, or worsening?"      (Note: Constant means the pain never goes away completely; most serious pain is constant and it progresses)     - If intermittent: "How long does it last?" "Do you have pain now?"     (Note: Intermittent means the pain goes away completely between bouts)     Comes and goes 6. SEVERITY: "How bad is the pain?"  (e.g., Scale 1-10; mild, moderate, or severe)   - MILD (1-3): doesn't interfere with normal activities, abdomen soft and not tender to touch    - MODERATE (4-7): interferes with normal activities or awakens from sleep, tender to touch    - SEVERE (8-10): excruciating pain, doubled over, unable to do any normal activities      8-9 7. RECURRENT SYMPTOM: "Have you ever had this type of abdominal pain before?" If so, ask: "When was the last time?" and "What happened that time?"      Yes 8. CAUSE: "What do you think is causing the abdominal pain?"     Diverticulitis 9. RELIEVING/AGGRAVATING FACTORS: "What makes it better or worse?" (e.g., movement, antacids, bowel movement)     No 10. OTHER SYMPTOMS: "Has there been any vomiting, diarrhea, constipation, or urine problems?"       Nausea 11. PREGNANCY: "Is there any chance you are pregnant?" "When was your last menstrual period?"       No  Protocols used: ABDOMINAL PAIN - FEMALE-A-AH  Pt. States this her diverticulitis. Pt. Will go to ED.

## 2017-09-09 NOTE — ED Notes (Signed)
Pt states she has a hx of diverticulitis and this feels the same, states she had it in November and did the 10day course but her sx never completely cleared up, states in the past 4 days the pain has increased, denies diarrhea. With palpation the pt presents with RUQ pain. No noted abd distention.

## 2017-09-09 NOTE — Discharge Instructions (Signed)
Take the antibiotic as prescribed and finish the full course.  Return to the emergency department immediately for new or worsening pain, fevers, weakness, vomiting or inability to tolerate the antibiotic, lead in the stool, worsening urinary symptoms, or any other new or worsening concern you.  You should make an appointment to follow-up with the gastroenterologist that you were referred to within the next 2 weeks.  You may take the tramadol for more severe pain although you should try to use over-the-counter Tylenol or ibuprofen as the main pain relief and only use the tramadol when necessary.

## 2017-09-09 NOTE — Telephone Encounter (Signed)
Agree with ER since pt sounds very sick or weak to the triager.  Thanks.  Will await ER notes.

## 2017-09-20 ENCOUNTER — Encounter: Payer: Self-pay | Admitting: Family Medicine

## 2017-09-20 ENCOUNTER — Ambulatory Visit: Payer: Managed Care, Other (non HMO) | Admitting: Family Medicine

## 2017-09-20 ENCOUNTER — Ambulatory Visit: Payer: Self-pay

## 2017-09-20 VITALS — BP 130/78 | HR 81 | Temp 98.4°F | Wt 198.5 lb

## 2017-09-20 DIAGNOSIS — L500 Allergic urticaria: Secondary | ICD-10-CM | POA: Diagnosis not present

## 2017-09-20 DIAGNOSIS — T50905A Adverse effect of unspecified drugs, medicaments and biological substances, initial encounter: Secondary | ICD-10-CM

## 2017-09-20 MED ORDER — PREDNISONE 20 MG PO TABS: ORAL_TABLET | ORAL | 0 refills | Status: DC

## 2017-09-20 NOTE — Telephone Encounter (Signed)
   Reason for Disposition . [1] MODERATE-SEVERE hives persist (i.e., hives interfere with normal activities or work) AND [2] taking antihistamine (e.g., Benadryl, Claritin) > 24 hours  Answer Assessment - Initial Assessment Questions 1. APPEARANCE: "What does the rash look like?"      Irregular,red 2. LOCATION: "Where is the rash located?"      Everywhere - none on face 3. NUMBER: "How many hives are there?"      Too many to count 4. SIZE: "How big are the hives?" (inches, cm, compare to coins) "Do they all look the same or is there lots of variation in shape and size?"      Different sizes 5. ONSET: "When did the hives begin?" (Hours or days ago)      Yesterday 6. ITCHING: "Does it itch?" If so, ask: "How bad is the itch?"    - MILD: doesn't interfere with normal activities   - MODERATE-SEVERE: interferes with work, school, sleep, or other activities      Severe 7. RECURRENT PROBLEM: "Have you had hives before?" If so, ask: "When was the last time?" and "What happened that time?"      Yes- other allergies 8. TRIGGERS: "Were you exposed to any new food, plant, cosmetic product or animal just before the hives began?"     Antibiotic 9. OTHER SYMPTOMS: "Do you have any other symptoms?" (e.g., fever, tongue swelling, difficulty breathing, abdominal pain)     No 10. PREGNANCY: "Is there any chance you are pregnant?" "When was your last menstrual period?"       No  Protocols used: HIVES-A-AH  Pt. Is currently on Augmentin. States she has had hives from other antibiotics.

## 2017-09-20 NOTE — Assessment & Plan Note (Addendum)
Presumed to augmentin. Discussed with patient. Continue benadryl 25mg  at night. Add zyrtec 10mg  in am and zantac 150mg  bid. Rx for prednisone printed out - discussed to fill if ongoing symptoms past next 2 days. Hopeful for rapid improvement as she has stopped augmentin.  Given multiple antibiotic allergies, will discuss with PCP allergist referral for future guidance - he agrees.

## 2017-09-20 NOTE — Progress Notes (Signed)
BP 130/78 (BP Location: Left Arm, Patient Position: Sitting, Cuff Size: Large)   Pulse 81   Temp 98.4 F (36.9 C) (Oral)   Wt 198 lb 8 oz (90 kg)   SpO2 98%   BMI 38.77 kg/m    CC: hives Subjective:    Patient ID: Deborah Brandt, female    DOB: 06-Feb-1966, 52 y.o.   MRN: [id number]  HPI: Deborah Brandt is a 52 y.o. female presenting on 09/20/2017 for Urticaria (started yesterday. covered on the body. thinks it may be due to antibiotic she has been on for 11 days.)   1 d h/o hives very itchy throughout body. Treating with benadryl and tylenol. Has tolerated prednisone well in the past. Denies dyspnea, tongue, throat, lip swelling, abd pain, nausea.   H/o recurrent diverticulitis. Latest flare dx by [facility] started augmentin 12/30. Second round of diverticulitis 1/18 at ER - treated again with augmentin. (initially treated with cipro IV - caused immediate hives).   She has completed 11 days of augmentin therapy - had 3 days left for 2 wk course. Diverticulitis symptoms have fully resolved.   GI appt 10/26/2017. Has had colonoscopy.   Relevant past medical, surgical, family and social history reviewed and updated as indicated. Interim medical history since our last visit reviewed. Allergies and medications reviewed and updated. Outpatient Medications Prior to Visit  Medication Sig Dispense Refill  . acetaminophen (TYLENOL) 500 MG tablet Take 500-1,000 mg by mouth every 6 (six) hours as needed.    Marland Kitchen amoxicillin-clavulanate (AUGMENTIN) 875-125 MG tablet Take 1 tablet by mouth 2 (two) times daily for 14 days. (Patient not taking: Reported on 09/20/2017) 28 tablet 0   No facility-administered medications prior to visit.      Per HPI unless specifically indicated in ROS section below Review of Systems     Objective:    BP 130/78 (BP Location: Left Arm, Patient Position: Sitting, Cuff Size: Large)   Pulse 81   Temp 98.4 F (36.9 C) (Oral)   Wt 198 lb 8 oz (90 kg)   SpO2 98%   BMI 38.77  kg/m   Wt Readings from Last 3 Encounters:  09/20/17 198 lb 8 oz (90 kg)  09/09/17 200 lb (90.7 kg)  02/07/17 200 lb 8 oz (90.9 kg)    Physical Exam  Constitutional: She appears well-developed and well-nourished. No distress.  HENT:  Mouth/Throat: Oropharynx is clear and moist. No oropharyngeal exudate.  Cardiovascular: Normal rate, regular rhythm, normal heart sounds and intact distal pulses.  No murmur heard. Pulmonary/Chest: Effort normal and breath sounds normal. No respiratory distress. She has no wheezes. She has no rales.  Skin: Rash noted. Rash is macular and urticarial.  Blanching erythematous pruritic macular rash on trunk and around neck  Nursing note and vitals reviewed.  Results for orders placed or performed during the hospital encounter of 09/09/17  Lipase, blood  Result Value Ref Range   Lipase 27 11 - 51 U/L  Comprehensive metabolic panel  Result Value Ref Range   Sodium 133 (L) 135 - 145 mmol/L   Potassium 4.0 3.5 - 5.1 mmol/L   Chloride 101 101 - 111 mmol/L   CO2 20 (L) 22 - 32 mmol/L   Glucose, Bld 127 (H) 65 - 99 mg/dL   BUN 10 6 - 20 mg/dL   Creatinine, Ser 0.83 0.44 - 1.00 mg/dL   Calcium 9.4 8.9 - 10.3 mg/dL   Total Protein 7.6 6.5 - 8.1 g/dL   Albumin 4.0 3.5 -  5.0 g/dL   AST 25 15 - 41 U/L   ALT 24 14 - 54 U/L   Alkaline Phosphatase 53 38 - 126 U/L   Total Bilirubin 1.3 (H) 0.3 - 1.2 mg/dL   GFR calc non Af Amer >60 >60 mL/min   GFR calc Af Amer >60 >60 mL/min   Anion gap 12 5 - 15  CBC  Result Value Ref Range   WBC 16.2 (H) 3.6 - 11.0 K/uL   RBC 4.38 3.80 - 5.20 MIL/uL   Hemoglobin 13.6 12.0 - 16.0 g/dL   HCT 39.7 35.0 - 47.0 %   MCV 90.7 80.0 - 100.0 fL   MCH 31.0 26.0 - 34.0 pg   MCHC 34.2 32.0 - 36.0 g/dL   RDW 13.3 11.5 - 14.5 %   Platelets 202 150 - 440 K/uL  Urinalysis, Complete w Microscopic  Result Value Ref Range   Color, Urine YELLOW (A) YELLOW   APPearance CLOUDY (A) CLEAR   Specific Gravity, Urine 1.009 1.005 - 1.030   pH  7.0 5.0 - 8.0   Glucose, UA NEGATIVE NEGATIVE mg/dL   Hgb urine dipstick SMALL (A) NEGATIVE   Bilirubin Urine NEGATIVE NEGATIVE   Ketones, ur NEGATIVE NEGATIVE mg/dL   Protein, ur NEGATIVE NEGATIVE mg/dL   Nitrite NEGATIVE NEGATIVE   Leukocytes, UA SMALL (A) NEGATIVE   RBC / HPF 0-5 0 - 5 RBC/hpf   WBC, UA 6-30 0 - 5 WBC/hpf   Bacteria, UA FEW (A) NONE SEEN   Squamous Epithelial / LPF TOO NUMEROUS TO COUNT (A) NONE SEEN   Mucus PRESENT    Hyaline Casts, UA PRESENT       Assessment & Plan:   Problem List Items Addressed This Visit    Drug-induced urticaria - Primary    Presumed to augmentin. Discussed with patient. Continue benadryl 25mg  at night. Add zyrtec 10mg  in am and zantac 150mg  bid. Rx for prednisone printed out - discussed to fill if ongoing symptoms past next 2 days. Hopeful for rapid improvement as she has stopped augmentin.  Given multiple antibiotic allergies, will discuss with PCP allergist referral for future guidance - he agrees.       Relevant Orders   Ambulatory referral to Allergy       Follow up plan: Return if symptoms worsen or fail to improve.  Ria Bush, MD

## 2017-09-20 NOTE — Patient Instructions (Signed)
For hives - presumed from augmentin - treat with continued benadryl 25mg  at night time, start zyrtec 10mg  daily and zantac 150mg  twice daily for next few days to help control rash. If persistent rash past 2 days, fill prednisone course.  I will check with Dr Damita Dunnings about referral to allergist for multiple antibiotic allergies.

## 2017-09-21 ENCOUNTER — Telehealth: Payer: Self-pay

## 2017-09-21 DIAGNOSIS — T50905A Adverse effect of unspecified drugs, medicaments and biological substances, initial encounter: Principal | ICD-10-CM

## 2017-09-21 DIAGNOSIS — L500 Allergic urticaria: Secondary | ICD-10-CM

## 2017-09-21 NOTE — Telephone Encounter (Signed)
I put in the referral order.  Thanks.

## 2017-09-21 NOTE — Telephone Encounter (Signed)
Pt needs new referral for allergy specialist at either [facility] or [facility]. [facility] [place] [facility] does not do antibiotic testing.

## 2017-09-21 NOTE — Telephone Encounter (Signed)
Copied from [place] [id number]. Topic: Referral - Request >> Sep 21, 2017 10:16 AM Conception Chancy, NT wrote: Pt calling stating she was referred to a allergy specialist ( [place] ) and when she made the appt they informed her they can not do antibiotic testing and that she needed referred to [facility] or [facility] so she went ahead and canceled the appt with the [facility].

## 2017-09-21 NOTE — Telephone Encounter (Signed)
Patient notified as instructed by telephone and verbalized understanding. Advised patient that one of the referral coordinators will be in touch to get this set up for her.  

## 2017-10-19 ENCOUNTER — Telehealth: Payer: Self-pay | Admitting: Family Medicine

## 2017-10-19 NOTE — Telephone Encounter (Signed)
I have taking care of this and patient advised of her appointment-Adeel Guiffre Estell Harpin, RMA

## 2017-10-19 NOTE — Telephone Encounter (Signed)
Copied from [place] [phone]. Topic: Referral - Request >> Sep 21, 2017 10:16 AM Conception Chancy, NT wrote: Pt calling stating she was referred to a allergy specialist ( [place] ) and when she made the appt they informed her they can not do antibiotic testing and that she needed referred to [facility] or [facility] so she went ahead and canceled the appt with the [facility].     >> Oct 19, 2017  2:49 PM Cleaster Corin, NT wrote: Pt. Calling with name of Dr. and number that her insurance covers for an allergist testing. Dr. Winfred Burn at [facility] pt. Would like to have referral sent to this office.

## 2017-10-26 ENCOUNTER — Ambulatory Visit: Payer: Managed Care, Other (non HMO) | Admitting: Gastroenterology

## 2017-10-26 ENCOUNTER — Encounter: Payer: Self-pay | Admitting: Gastroenterology

## 2017-10-26 ENCOUNTER — Other Ambulatory Visit: Payer: Self-pay

## 2017-10-26 ENCOUNTER — Encounter (INDEPENDENT_AMBULATORY_CARE_PROVIDER_SITE_OTHER): Payer: Self-pay

## 2017-10-26 VITALS — BP 137/74 | HR 78 | Ht 60.0 in | Wt 204.0 lb

## 2017-10-26 DIAGNOSIS — K5732 Diverticulitis of large intestine without perforation or abscess without bleeding: Secondary | ICD-10-CM

## 2017-10-26 DIAGNOSIS — Z1211 Encounter for screening for malignant neoplasm of colon: Secondary | ICD-10-CM

## 2017-10-26 NOTE — Progress Notes (Signed)
Gastroenterology Consultation  Referring Provider:     Tonia Ghent, MD Primary Care Physician:  Tonia Ghent, MD Primary Gastroenterologist:  Dr. Allen Norris     Reason for Consultation:     Recurrent diverticulitis        HPI:   Deborah Brandt is a 52 y.o. y/o female referred for consultation & management of Recurrent diverticulitis by Dr. Damita Dunnings, Elveria Rising, MD.  This patient reports that she is had diverticulitis off and on for the last 10 years.  The patient reports that she usually has an attack every 6 months for the last few years.  The patient had a recent admission to the emergency room where upon she was treated with antibiotics.  The patient states she had a reaction to the Cipro and was put on Augmentin at which time she also suffered a allergic reaction.  The patient has been told that she is feeling much better and her CT scan showed her to have diverticulitis in the descending colon.  The patient believes she had a colonoscopy approximately 6 years ago and states that nothing significant was found at that time.  There is no report of any unexplained weight loss black stools or bloody stools.  The patient also reports that she is supposed to see a specialist who deals with allergies to see if there are any antibiotics that she can take in case she gets a recurrent attack of diverticulitis.  Past Medical History:  Diagnosis Date  . Diverticulitis   . HLD (hyperlipidemia)   . Migraine   . Renal stones     Past Surgical History:  Procedure Laterality Date  . BREAST BIOPSY Left 08/22/2017  . SINUS EXPLORATION      Prior to Admission medications   Medication Sig Start Date End Date Taking? Authorizing Provider  acetaminophen (TYLENOL) 500 MG tablet Take 500-1,000 mg by mouth every 6 (six) hours as needed.    [provider]    Family History  Problem Relation Age of Onset  . Breast cancer Mother 59  . Diabetes Father   . Colon cancer Paternal Grandmother    dx'd at age 57  . Diabetes Paternal Grandmother      Social History   Tobacco Use  . Smoking status: Never Smoker  . Smokeless tobacco: Never Used  Substance Use Topics  . Alcohol use: No    Frequency: Never    Comment: 1 beer a month or less.   . Drug use: No    Allergies as of 10/26/2017 - Review Complete 10/26/2017  Allergen Reaction Noted  . Levaquin [levofloxacin] Hives 04/04/2015  . Augmentin [amoxicillin-pot clavulanate] Hives 09/20/2017  . Ciprofloxacin Hives and Nausea And Vomiting 09/20/2017  . Codeine Nausea And Vomiting 04/04/2015  . Doxycycline Hives 04/04/2015  . Erythromycin Hives 04/04/2015  . Sulfa antibiotics Hives 04/04/2015    Review of Systems:    All systems reviewed and negative except where noted in HPI.   Physical Exam:  BP 137/74   Pulse 78   Ht 5' (1.524 m)   Wt 204 lb (92.5 kg)   BMI 39.84 kg/m  No LMP recorded. Patient is postmenopausal. Psych:  Alert and cooperative. Normal mood and affect. General:   Alert,  Well-developed, well-nourished, pleasant and cooperative in NAD Head:  Normocephalic and atraumatic. Eyes:  Sclera clear, no icterus.   Conjunctiva pink. Ears:  Normal auditory acuity. Nose:  No deformity, discharge, or lesions. Mouth:  No deformity or lesions,oropharynx  pink & moist. Neck:  Supple; no masses or thyromegaly. Lungs:  Respirations even and unlabored.  Clear throughout to auscultation.   No wheezes, crackles, or rhonchi. No acute distress. Heart:  Regular rate and rhythm; no murmurs, clicks, rubs, or gallops. Abdomen:  Normal bowel sounds.  No bruits.  Soft, non-tender and non-distended without masses, hepatosplenomegaly or hernias noted.  No guarding or rebound tenderness.  Negative Carnett sign.   Rectal:  Deferred.  Msk:  Symmetrical without gross deformities.  Good, equal movement & strength bilaterally. Pulses:  Normal pulses noted. Extremities:  No clubbing or edema.  No cyanosis. Neurologic:  Alert and  oriented x3;  grossly normal neurologically. Skin:  Intact without significant lesions or rashes.  No jaundice. Lymph Nodes:  No significant cervical adenopathy. Psych:  Alert and cooperative. Normal mood and affect.  Imaging Studies: No results found.  Assessment and Plan:   Deborah Brandt is a 52 y.o. y/o female Who has recurrent diverticulitis.  The patient had a recent attack in January.  The patient has been explained the risks of perforation and abscess with a possible need for a colostomy.  The patient states that she understands that she may need surgery in the future but is not ready to go down that road at this time.  The patient has been told that since she has not had a colonoscopy in many years she should undergo a repeat colonoscopy to make sure there is no other pathology causing her symptoms and to also evaluate the extent of her diverticulosis. I have discussed risks & benefits which include, but are not limited to, bleeding, infection, perforation & drug reaction.  The patient agrees with this plan & written consent will be obtained.     Lucilla Lame, MD. Marval Regal   Note: This dictation was prepared with Dragon dictation along with smaller phrase technology. Any transcriptional errors that result from this process are unintentional.

## 2017-11-28 ENCOUNTER — Other Ambulatory Visit: Payer: Self-pay

## 2017-11-28 ENCOUNTER — Encounter: Payer: Self-pay | Admitting: *Deleted

## 2017-12-01 NOTE — Discharge Instructions (Signed)
General Anesthesia, Adult, Care After °These instructions provide you with information about caring for yourself after your procedure. Your health care provider may also give you more specific instructions. Your treatment has been planned according to current medical practices, but problems sometimes occur. Call your health care provider if you have any problems or questions after your procedure. °What can I expect after the procedure? °After the procedure, it is common to have: °· Vomiting. °· A sore throat. °· Mental slowness. ° °It is common to feel: °· Nauseous. °· Cold or shivery. °· Sleepy. °· Tired. °· Sore or achy, even in parts of your body where you did not have surgery. ° °Follow these instructions at home: °For at least 24 hours after the procedure: °· Do not: °? Participate in activities where you could fall or become injured. °? Drive. °? Use heavy machinery. °? Drink alcohol. °? Take sleeping pills or medicines that cause drowsiness. °? Make important decisions or sign legal documents. °? Take care of children on your own. °· Rest. °Eating and drinking °· If you vomit, drink water, juice, or soup when you can drink without vomiting. °· Drink enough fluid to keep your urine clear or pale yellow. °· Make sure you have little or no nausea before eating solid foods. °· Follow the diet recommended by your health care provider. °General instructions °· Have a responsible adult stay with you until you are awake and alert. °· Return to your normal activities as told by your health care provider. Ask your health care provider what activities are safe for you. °· Take over-the-counter and prescription medicines only as told by your health care provider. °· If you smoke, do not smoke without supervision. °· Keep all follow-up visits as told by your health care provider. This is important. °Contact a health care provider if: °· You continue to have nausea or vomiting at home, and medicines are not helpful. °· You  cannot drink fluids or start eating again. °· You cannot urinate after 8-12 hours. °· You develop a skin rash. °· You have fever. °· You have increasing redness at the site of your procedure. °Get help right away if: °· You have difficulty breathing. °· You have chest pain. °· You have unexpected bleeding. °· You feel that you are having a life-threatening or urgent problem. °This information is not intended to replace advice given to you by your health care provider. Make sure you discuss any questions you have with your health care provider. °Document Released: 11/15/2000 Document Revised: 01/12/2016 Document Reviewed: 07/24/2015 °Elsevier Interactive Patient Education © 2018 Elsevier Inc. ° °

## 2017-12-02 ENCOUNTER — Encounter
Admission: RE | Disposition: A | Discharge status: Home / Self Care | Payer: Self-pay | Source: Ambulatory Visit | Attending: Gastroenterology

## 2017-12-02 ENCOUNTER — Ambulatory Visit
Admission: RE | Admit: 2017-12-02 | Discharge: 2017-12-02 | Disposition: A | Discharge status: Home / Self Care | Payer: Managed Care, Other (non HMO) | Source: Ambulatory Visit | Attending: Gastroenterology | Admitting: Gastroenterology

## 2017-12-02 ENCOUNTER — Ambulatory Visit: Payer: Managed Care, Other (non HMO) | Admitting: Anesthesiology

## 2017-12-02 DIAGNOSIS — R0683 Snoring: Secondary | ICD-10-CM | POA: Insufficient documentation

## 2017-12-02 DIAGNOSIS — E785 Hyperlipidemia, unspecified: Secondary | ICD-10-CM | POA: Diagnosis not present

## 2017-12-02 DIAGNOSIS — Z888 Allergy status to other drugs, medicaments and biological substances status: Secondary | ICD-10-CM | POA: Diagnosis not present

## 2017-12-02 DIAGNOSIS — Z6839 Body mass index (BMI) 39.0-39.9, adult: Secondary | ICD-10-CM | POA: Insufficient documentation

## 2017-12-02 DIAGNOSIS — Z800 Family history of malignant neoplasm of digestive organs: Secondary | ICD-10-CM | POA: Insufficient documentation

## 2017-12-02 DIAGNOSIS — Z885 Allergy status to narcotic agent status: Secondary | ICD-10-CM | POA: Insufficient documentation

## 2017-12-02 DIAGNOSIS — Z1211 Encounter for screening for malignant neoplasm of colon: Secondary | ICD-10-CM

## 2017-12-02 DIAGNOSIS — Z882 Allergy status to sulfonamides status: Secondary | ICD-10-CM | POA: Insufficient documentation

## 2017-12-02 DIAGNOSIS — Z8719 Personal history of other diseases of the digestive system: Secondary | ICD-10-CM | POA: Insufficient documentation

## 2017-12-02 DIAGNOSIS — K640 First degree hemorrhoids: Secondary | ICD-10-CM | POA: Diagnosis not present

## 2017-12-02 DIAGNOSIS — E669 Obesity, unspecified: Secondary | ICD-10-CM | POA: Diagnosis not present

## 2017-12-02 DIAGNOSIS — Z880 Allergy status to penicillin: Secondary | ICD-10-CM | POA: Diagnosis not present

## 2017-12-02 DIAGNOSIS — K621 Rectal polyp: Secondary | ICD-10-CM

## 2017-12-02 DIAGNOSIS — Z881 Allergy status to other antibiotic agents status: Secondary | ICD-10-CM | POA: Insufficient documentation

## 2017-12-02 HISTORY — PX: POLYPECTOMY: SHX5525

## 2017-12-02 HISTORY — PX: COLONOSCOPY WITH PROPOFOL: SHX5780

## 2017-12-02 HISTORY — DX: Adverse effect of unspecified anesthetic, initial encounter: T41.45XA

## 2017-12-02 HISTORY — DX: Cardiac murmur, unspecified: R01.1

## 2017-12-02 HISTORY — DX: Other complications of anesthesia, initial encounter: T88.59XA

## 2017-12-02 HISTORY — DX: Motion sickness, initial encounter: T75.3XXA

## 2017-12-02 SURGERY — COLONOSCOPY WITH PROPOFOL
Anesthesia: General | Wound class: Contaminated

## 2017-12-02 MED ORDER — LACTATED RINGERS IV SOLN
INTRAVENOUS | Status: DC
  Administered 2017-12-02: 09:00:00 via INTRAVENOUS

## 2017-12-02 MED ORDER — ONDANSETRON HCL 4 MG/2ML IJ SOLN: 4.0000 mg | Freq: Once | INTRAMUSCULAR | Status: DC | PRN

## 2017-12-02 MED ORDER — PROPOFOL 10 MG/ML IV BOLUS
INTRAVENOUS | Status: DC | PRN
  Administered 2017-12-02: 30 mg via INTRAVENOUS
  Administered 2017-12-02: 20 mg via INTRAVENOUS
  Administered 2017-12-02: 10 mg via INTRAVENOUS
  Administered 2017-12-02: 30 mg via INTRAVENOUS
  Administered 2017-12-02: 60 mg via INTRAVENOUS

## 2017-12-02 MED ORDER — ACETAMINOPHEN 325 MG PO TABS: 650.0000 mg | ORAL_TABLET | Freq: Once | ORAL | Status: DC | PRN

## 2017-12-02 MED ORDER — SIMETHICONE 40 MG/0.6ML PO SUSP
ORAL | Status: DC | PRN
  Administered 2017-12-02: 09:00:00

## 2017-12-02 MED ORDER — ACETAMINOPHEN 160 MG/5ML PO SOLN: 325.0000 mg | ORAL | Status: DC | PRN

## 2017-12-02 MED ORDER — LIDOCAINE HCL (CARDIAC) 20 MG/ML IV SOLN
INTRAVENOUS | Status: DC | PRN
  Administered 2017-12-02: 20 mg via INTRAVENOUS

## 2017-12-02 SURGICAL SUPPLY — 24 items
CANISTER SUCT 1200ML W/VALVE (MISCELLANEOUS) ×4 IMPLANT
CLIP HMST 235XBRD CATH ROT (MISCELLANEOUS) IMPLANT
CLIP RESOLUTION 360 11X235 (MISCELLANEOUS)
ELECT REM PT RETURN 9FT ADLT (ELECTROSURGICAL)
ELECTRODE REM PT RTRN 9FT ADLT (ELECTROSURGICAL) IMPLANT
FCP ESCP3.2XJMB 240X2.8X (MISCELLANEOUS)
FORCEPS BIOP RAD 4 LRG CAP 4 (CUTTING FORCEPS) ×2 IMPLANT
FORCEPS BIOP RJ4 240 W/NDL (MISCELLANEOUS)
FORCEPS ESCP3.2XJMB 240X2.8X (MISCELLANEOUS) IMPLANT
GOWN CVR UNV OPN BCK APRN NK (MISCELLANEOUS) ×4 IMPLANT
GOWN ISOL THUMB LOOP REG UNIV (MISCELLANEOUS) ×8
INJECTOR VARIJECT VIN23 (MISCELLANEOUS) IMPLANT
KIT DEFENDO VALVE AND CONN (KITS) IMPLANT
KIT ENDO PROCEDURE OLY (KITS) ×4 IMPLANT
MARKER SPOT ENDO TATTOO 5ML (MISCELLANEOUS) IMPLANT
PROBE APC STR FIRE (PROBE) IMPLANT
RETRIEVER NET ROTH 2.5X230 LF (MISCELLANEOUS) IMPLANT
SNARE SHORT THROW 13M SML OVAL (MISCELLANEOUS) IMPLANT
SNARE SHORT THROW 30M LRG OVAL (MISCELLANEOUS) IMPLANT
SNARE SNG USE RND 15MM (INSTRUMENTS) IMPLANT
SPOT EX ENDOSCOPIC TATTOO (MISCELLANEOUS)
TRAP ETRAP POLY (MISCELLANEOUS) IMPLANT
VARIJECT INJECTOR VIN23 (MISCELLANEOUS)
WATER STERILE IRR 250ML POUR (IV SOLUTION) ×4 IMPLANT

## 2017-12-02 NOTE — Op Note (Signed)
[facility] Gastroenterology Patient Name: Deborah Brandt Procedure Date: 12/02/2017 9:00 AM MRN: [id number] Account #: [id number] Date of Birth: 12/08/1965 Admit Type: Outpatient Age: 52 Room: [facility] OR ROOM 01 Gender: Female Note Status: Finalized Procedure:            Colonoscopy Indications:          Screening for colorectal malignant neoplasm Providers:            Lucilla Lame MD, MD Referring MD:         Elveria Rising. Damita Dunnings, MD (Referring MD) Medicines:            Propofol per Anesthesia Complications:        No immediate complications. Procedure:            Pre-Anesthesia Assessment:                       - Prior to the procedure, a History and Physical was                        performed, and patient medications and allergies were                        reviewed. The patient's tolerance of previous                        anesthesia was also reviewed. The risks and benefits of                        the procedure and the sedation options and risks were                        discussed with the patient. All questions were                        answered, and informed consent was obtained. Prior                        Anticoagulants: The patient has taken no previous                        anticoagulant or antiplatelet agents. ASA Grade                        Assessment: II - A patient with mild systemic disease.                        After reviewing the risks and benefits, the patient was                        deemed in satisfactory condition to undergo the                        procedure.                       After obtaining informed consent, the colonoscope was                        passed under direct vision. Throughout the procedure,  the patient's blood pressure, pulse, and oxygen                        saturations were monitored continuously. The [place] [phone]) was introduced through the                         anus and advanced to the the cecum, identified by                        appendiceal orifice and ileocecal valve. The                        colonoscopy was performed without difficulty. The                        patient tolerated the procedure well. The quality of                        the bowel preparation was excellent. Findings:      The perianal and digital rectal examinations were normal.      A 3 mm polyp was found in the rectum. The polyp was sessile. The polyp       was removed with a cold biopsy forceps. Resection and retrieval were       complete.      Multiple small-mouthed diverticula were found in the sigmoid colon and       descending colon.      Non-bleeding internal hemorrhoids were found during retroflexion. The       hemorrhoids were Grade I (internal hemorrhoids that do not prolapse). Impression:           - One 3 mm polyp in the rectum, removed with a cold                        biopsy forceps. Resected and retrieved.                       - Diverticulosis in the sigmoid colon and in the                        descending colon.                       - Non-bleeding internal hemorrhoids. Recommendation:       - Discharge patient to home.                       - Resume previous diet.                       - Continue present medications.                       - Await pathology results.                       - Repeat colonoscopy in 5 years if polyp adenoma and 10  years if hyperplastic Procedure Code(s):    --- Professional ---                       [phone], Colonoscopy, flexible; with biopsy, single or                        multiple Diagnosis Code(s):    --- Professional ---                       Z12.11, Encounter for screening for malignant neoplasm                        of colon                       K62.1, Rectal polyp CPT copyright 2017 American Medical Association. All rights reserved. The codes documented in this  report are preliminary and upon coder review may  be revised to meet current compliance requirements. Lucilla Lame MD, MD 12/02/2017 9:23:48 AM This report has been signed electronically. Number of Addenda: 0 Note Initiated On: 12/02/2017 9:00 AM Scope Withdrawal Time: 0 hours 7 minutes 9 seconds  Total Procedure Duration: 0 hours 9 minutes 18 seconds       [facility]

## 2017-12-02 NOTE — Transfer of Care (Signed)
Immediate Anesthesia Transfer of Care Note  Patient: Deborah Brandt  Procedure(s) Performed: COLONOSCOPY WITH PROPOFOL (N/A ) POLYPECTOMY  Patient Location: PACU  Anesthesia Type: General  Level of Consciousness: awake, alert  and patient cooperative  Airway and Oxygen Therapy: Patient Spontanous Breathing and Patient connected to supplemental oxygen  Post-op Assessment: Post-op Vital signs reviewed, Patient's Cardiovascular Status Stable, Respiratory Function Stable, Patent Airway and No signs of Nausea or vomiting  Post-op Vital Signs: Reviewed and stable  Complications: No apparent anesthesia complications

## 2017-12-02 NOTE — Anesthesia Preprocedure Evaluation (Signed)
Anesthesia Evaluation  Patient identified by MRN, date of birth, ID band Patient awake    Reviewed: Allergy & Precautions, NPO status , Patient's Chart, lab work & pertinent test results  History of Anesthesia Complications Negative for: history of anesthetic complications  Airway Mallampati: I  TM Distance: >3 FB Neck ROM: Full    Dental  (+)    Pulmonary  Snoring    Pulmonary exam normal breath sounds clear to auscultation       Cardiovascular Exercise Tolerance: Good negative cardio ROS Normal cardiovascular exam Rhythm:Regular Rate:Normal     Neuro/Psych  Headaches,    GI/Hepatic negative GI ROS,   Endo/Other  Hx gestational DM; obesity  Renal/GU Renal disease (hx nephrolithiasis)     Musculoskeletal   Abdominal   Peds  Hematology negative hematology ROS (+)   Anesthesia Other Findings   Reproductive/Obstetrics                             Anesthesia Physical Anesthesia Plan  ASA: II  Anesthesia Plan: General   Post-op Pain Management:    Induction: Intravenous  PONV Risk Score and Plan: 2 and Propofol infusion and TIVA  Airway Management Planned: Natural Airway  Additional Equipment:   Intra-op Plan:   Post-operative Plan:   Informed Consent: I have reviewed the patients History and Physical, chart, labs and discussed the procedure including the risks, benefits and alternatives for the proposed anesthesia with the patient or authorized representative who has indicated his/her understanding and acceptance.     Plan Discussed with: CRNA  Anesthesia Plan Comments:         Anesthesia Quick Evaluation

## 2017-12-02 NOTE — H&P (Signed)
Lucilla Lame, MD [address], [place] [address] Phone: [phone] Fax : [phone]  Primary Care Physician:  Tonia Ghent, MD Primary Gastroenterologist:  Dr. Allen Norris  Pre-Procedure History & Physical: HPI:  Deborah Brandt is a 52 y.o. female is here for a screening colonoscopy.   Past Medical History:  Diagnosis Date  . Complication of anesthesia    after laparoscopy(1988) BP dropped.  Low BP after epidurals for childbirth  . Diverticulitis   . Heart murmur    at birth. no recent mention  . HLD (hyperlipidemia)   . Migraine   . Motion sickness    boats, back seat of car  . Renal stones     Past Surgical History:  Procedure Laterality Date  . BREAST BIOPSY Left 08/22/2017  . COLONOSCOPY    . DIAGNOSTIC LAPAROSCOPY  1987  . SINUS EXPLORATION      Prior to Admission medications   Medication Sig Start Date End Date Taking? Authorizing Provider  acetaminophen (TYLENOL) 500 MG tablet Take 500-1,000 mg by mouth every 6 (six) hours as needed.    [provider]    Allergies as of 10/26/2017 - Review Complete 10/26/2017  Allergen Reaction Noted  . Levaquin [levofloxacin] Hives 04/04/2015  . Augmentin [amoxicillin-pot clavulanate] Hives 09/20/2017  . Ciprofloxacin Hives and Nausea And Vomiting 09/20/2017  . Codeine Nausea And Vomiting 04/04/2015  . Doxycycline Hives 04/04/2015  . Erythromycin Hives 04/04/2015  . Sulfa antibiotics Hives 04/04/2015    Family History  Problem Relation Age of Onset  . Breast cancer Mother 70  . Diabetes Father   . Colon cancer Paternal Grandmother        dx'd at age 64  . Diabetes Paternal Grandmother     Social History   Socioeconomic History  . Marital status: Single    Spouse name: Not on file  . Number of children: Not on file  . Years of education: Not on file  . Highest education level: Not on file  Occupational History  . Not on file  Social Needs  . Financial resource strain: Not  on file  . Food insecurity:    Worry: Not on file    Inability: Not on file  . Transportation needs:    Medical: Not on file    Non-medical: Not on file  Tobacco Use  . Smoking status: Never Smoker  . Smokeless tobacco: Never Used  Substance and Sexual Activity  . Alcohol use: Yes    Frequency: Never    Comment: 1-2 beer per month  . Drug use: No  . Sexual activity: Not on file  Lifestyle  . Physical activity:    Days per week: Not on file    Minutes per session: Not on file  . Stress: Not on file  Relationships  . Social connections:    Talks on phone: Not on file    Gets together: Not on file    Attends religious service: Not on file    Active member of club or organization: Not on file    Attends meetings of clubs or organizations: Not on file    Relationship status: Not on file  . Intimate partner violence:    Fear of current or ex partner: Not on file    Emotionally abused: Not on file    Physically abused: Not on file    Forced sexual activity: Not on file  Other Topics Concern  . Not on file  Social History  Narrative   Divorced. No contact with ex-husband.   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 Neck:  Supple; no masses or thyromegaly. Lungs:  Clear throughout to auscultation.    Heart:  Regular rate and rhythm. Abdomen:  Soft, nontender and nondistended. Normal bowel sounds, without guarding, and without rebound.   Neurologic:  Alert and  oriented x4;  grossly normal  neurologically.  Impression/Plan: Deborah Brandt is now here to undergo a screening colonoscopy.  Risks, benefits, and alternatives regarding colonoscopy have been reviewed with the patient.  Questions have been answered.  All parties agreeable.

## 2017-12-02 NOTE — Anesthesia Postprocedure Evaluation (Signed)
Anesthesia Post Note  Patient: Deborah Brandt  Procedure(s) Performed: COLONOSCOPY WITH PROPOFOL (N/A ) POLYPECTOMY  Patient location during evaluation: PACU Anesthesia Type: General Level of consciousness: awake and alert, oriented and patient cooperative Pain management: pain level controlled Vital Signs Assessment: post-procedure vital signs reviewed and stable Respiratory status: spontaneous breathing, nonlabored ventilation and respiratory function stable Cardiovascular status: blood pressure returned to baseline and stable Postop Assessment: adequate PO intake Anesthetic complications: no    Darrin Nipper

## 2017-12-02 NOTE — Anesthesia Procedure Notes (Signed)
Procedure Name: MAC Performed by: Phyliss Hulick, CRNA Pre-anesthesia Checklist: Patient identified, Emergency Drugs available, Suction available, Patient being monitored and Timeout performed Patient Re-evaluated:Patient Re-evaluated prior to induction Oxygen Delivery Method: Nasal cannula       

## 2017-12-05 ENCOUNTER — Encounter: Payer: Self-pay | Admitting: Gastroenterology

## 2017-12-06 LAB — SURGICAL PATHOLOGY

## 2017-12-08 ENCOUNTER — Encounter: Payer: Self-pay | Admitting: Gastroenterology

## 2018-01-23 ENCOUNTER — Other Ambulatory Visit: Payer: Self-pay | Admitting: Family Medicine

## 2018-01-23 DIAGNOSIS — N6320 Unspecified lump in the left breast, unspecified quadrant: Secondary | ICD-10-CM

## 2018-01-24 IMAGING — CT CT ABD-PELV W/ CM
2 of 5 series · 15 of 46 positions shown, 17 images · IV contrast (APPLIED)
Comparison: January 21, 2017

CLINICAL DATA: Lower abdominal pain and elevated white blood cell
count

EXAM:
CT ABDOMEN AND PELVIS WITH CONTRAST
TECHNIQUE: Multidetector CT imaging of the abdomen and pelvis was performed
using the standard protocol following bolus administration of
intravenous contrast. Oral contrast also administered.
CONTRAST:  100mL UIXKG6-MPP IOPAMIDOL (UIXKG6-MPP) INJECTION 61%

[Series 2: routine abd/pel with · axial · 0.84mm/px · z∈[-488,-48]mm · 12 of 100 slices shown, 14 images]
[im 6/100  soft-tissue]
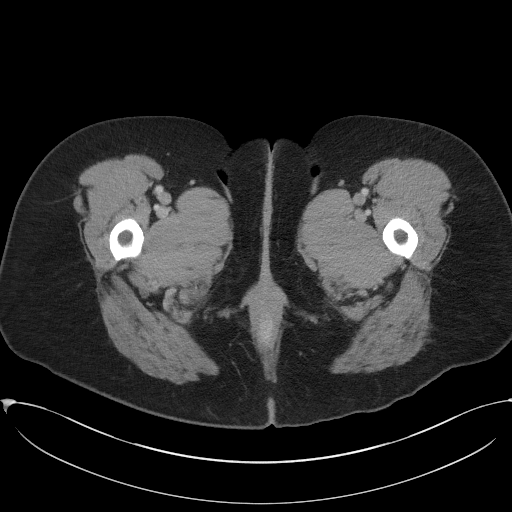
[im 6/100  bone]
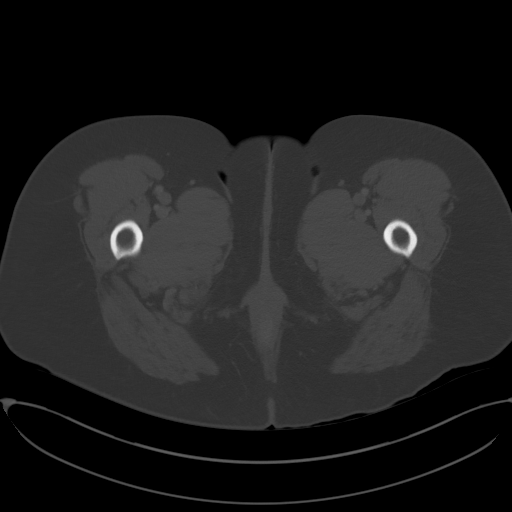
[im 17/100  soft-tissue]
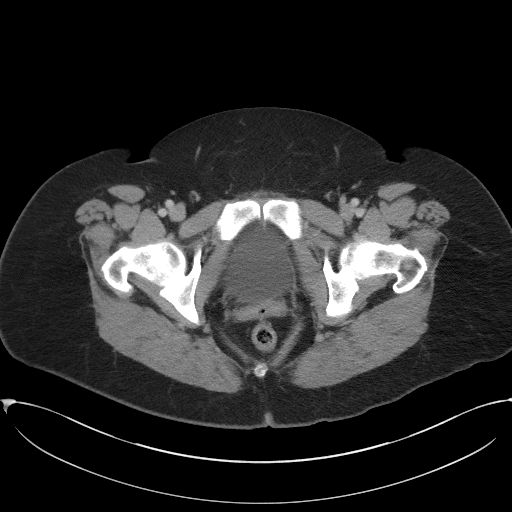
[im 23/100  soft-tissue]
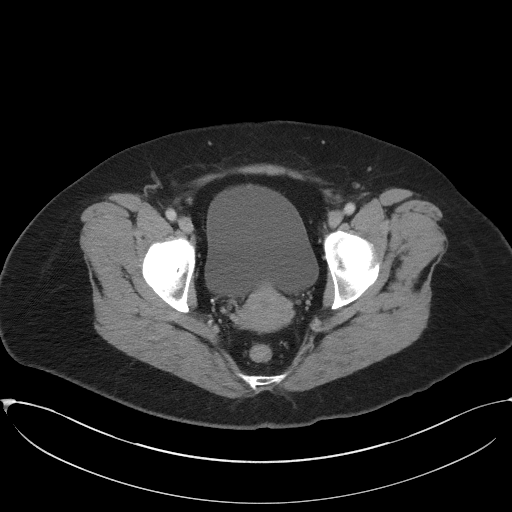
[im 28/100  soft-tissue]
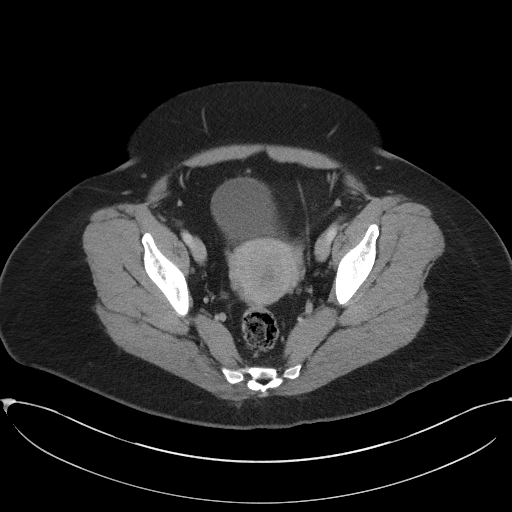
[im 39/100  soft-tissue]
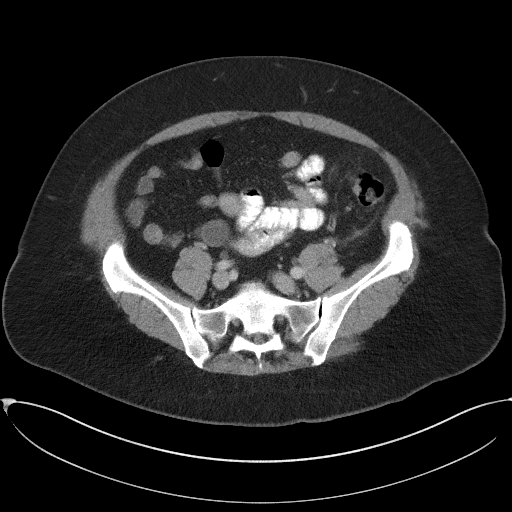
[im 45/100  soft-tissue]
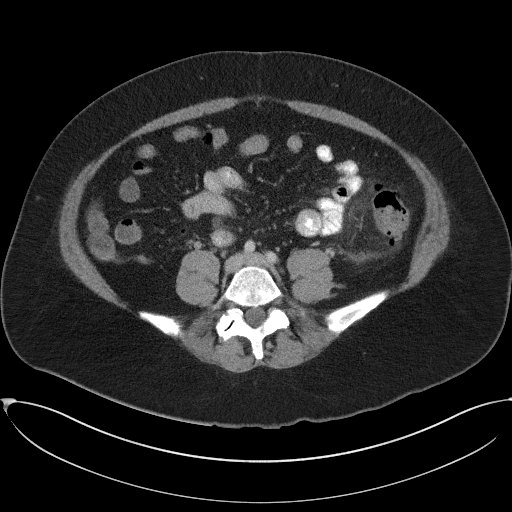
[im 56/100  soft-tissue]
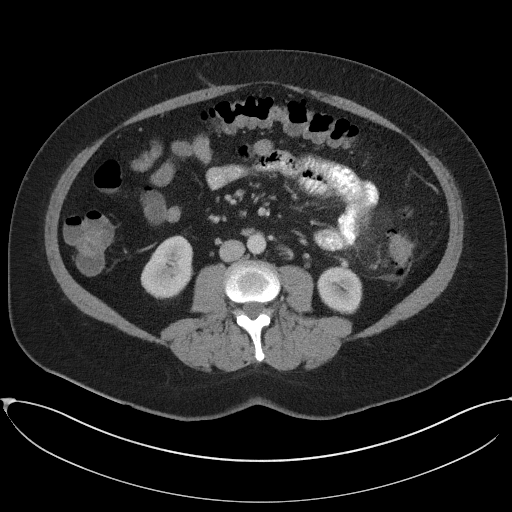
[im 61/100  soft-tissue]
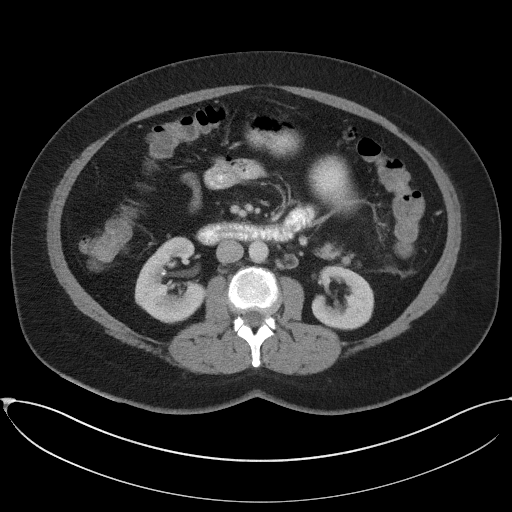
[im 72/100  soft-tissue]
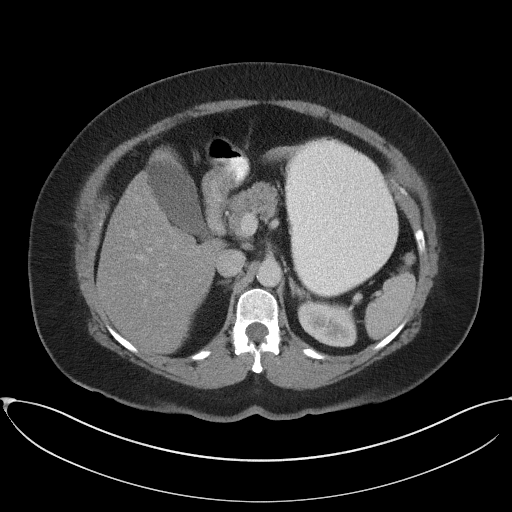
[im 72/100  bone]
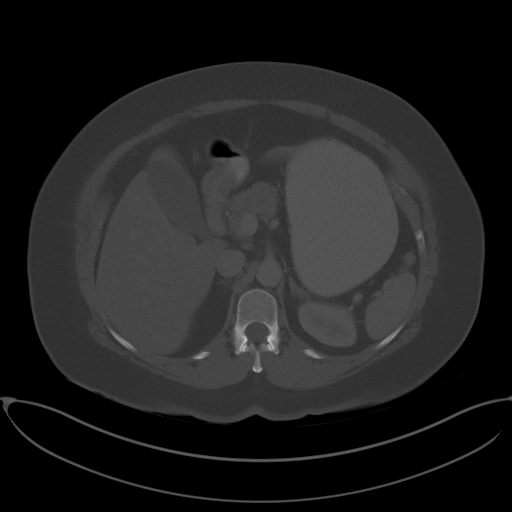
[im 78/100  soft-tissue]
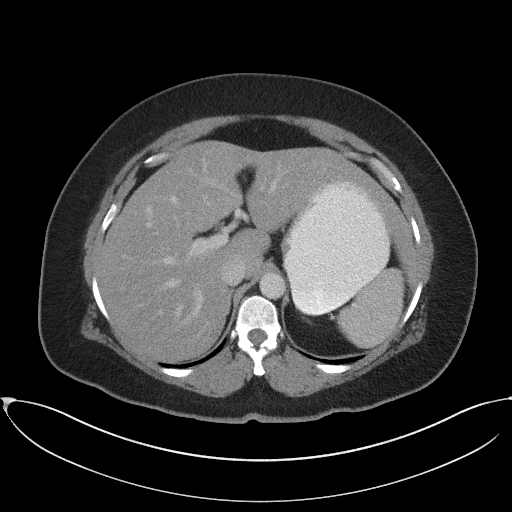
[im 83/100  soft-tissue]
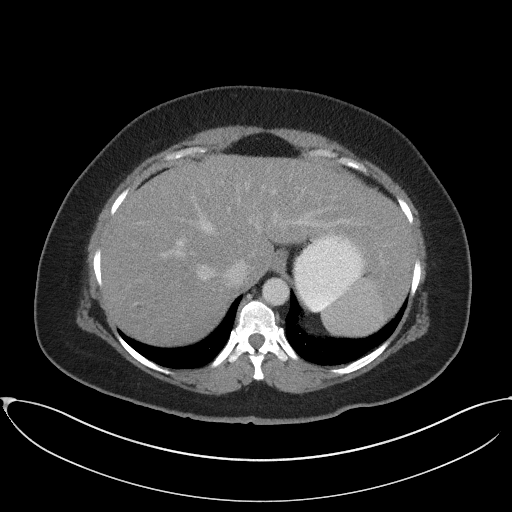
[im 94/100  soft-tissue]
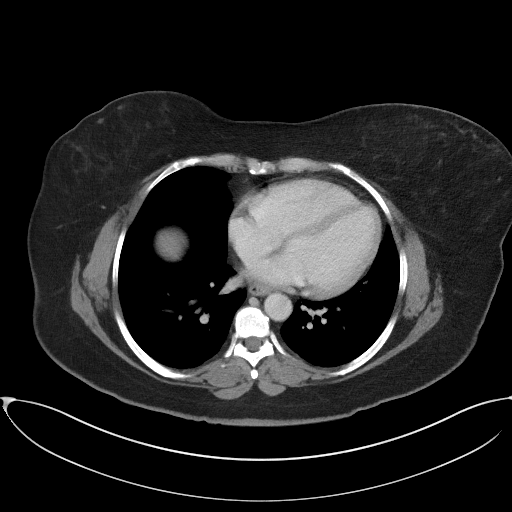

[Series 5: coronal st · coronal · 0.89mm/px · 3 of 100 slices shown]
[im 34/100  soft-tissue]
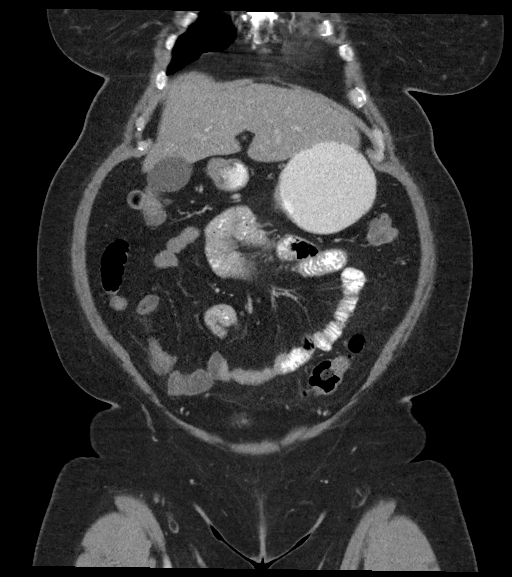
[im 45/100  soft-tissue]
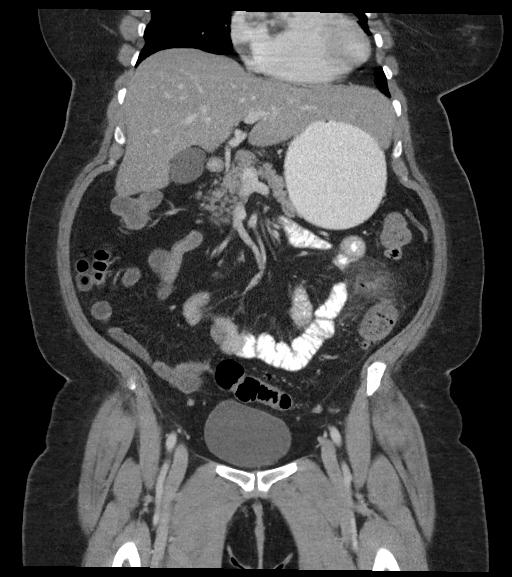
[im 56/100  soft-tissue]
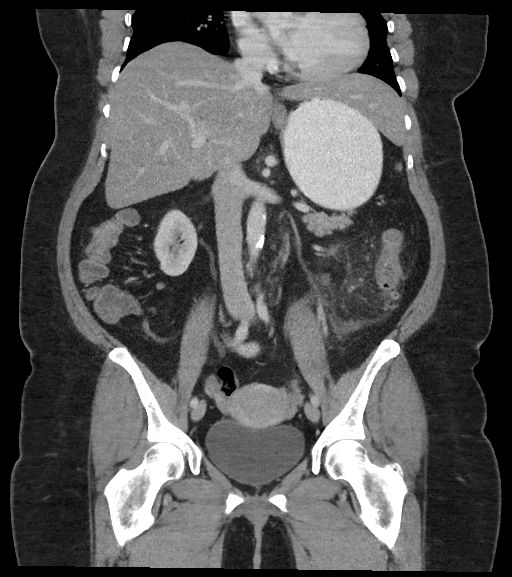

[15 of 46 positions shown; findings below may reference images not displayed]

FINDINGS: Lower chest: Lung bases clear.

Hepatobiliary: There is hepatic steatosis. No focal liver lesions
are evident. There is no appreciable gallbladder wall thickening.
There is no biliary duct dilatation.

Pancreas: No pancreatic mass or inflammatory focus.

Spleen: No splenic lesions are evident. There is a small splenule
anterior to the spleen inferiorly.

Adrenals/Urinary Tract: Adrenals bilaterally appear normal. Kidneys
bilaterally show no evident mass or hydronephrosis on either side.
There are two, 1 mm calcifications in the mid right kidney which may
be vascular in etiology. There is a calculus in the lower pole the
left kidney measuring 7 x 5 mm. There is no ureteral calculus on
either side. Urinary bladder is midline with wall thickness within
normal limits.

Stomach/Bowel: There is diverticulitis involving the mid the distal
sigmoid colon. There is mesenteric stranding in this area with
several irregular diverticula. There is thickening of the left
lateral conal fascia. There is no evident perforation or abscess in
this area of descending colonic diverticulitis. In the sigmoid
colon, there are diverticula without inflammatory change. Elsewhere,
there is no bowel wall or mesenteric thickening. No bowel
obstruction. No free air or portal venous air.

Vascular/Lymphatic: There is aortic atherosclerosis. No aneurysm.
Major mesenteric vessels appear patent. There are stable
subcentimeter retroperitoneal lymph nodes. The largest lymph node is
on the left and contains a central fatty hilum, stable. There is not
felt to be frank adenopathy by size criteria in the abdomen or
pelvis.

Reproductive: Uterus is anteverted. There is no evident pelvic mass.

Other: Appendix appears normal. There is no abscess or ascites in
the abdomen or pelvis. There is a minimal ventral hernia containing
only fat.

Musculoskeletal: There are no blastic or lytic bone lesions. No
intramuscular or abdominal wall lesion.
IMPRESSION: 1. Descending colonic diverticulitis without perforation or abscess
evident. There are diverticula in the sigmoid colon without
diverticulitis in this area.

2. No bowel obstruction. No abscess in the abdomen or pelvis.
Appendix appears normal.

3. 7 x 5 mm calculus lower pole left kidney. Probable vascular
calcifications mid right kidney. No ureteral calculus or
hydronephrosis on either side.

4.  Hepatic steatosis.

5.  Aortic atherosclerosis.

6.  Minimal ventral hernia containing only fat.

Aortic Atherosclerosis ([id number].T).

## 2018-02-07 DIAGNOSIS — D039 Melanoma in situ, unspecified: Secondary | ICD-10-CM

## 2018-02-07 HISTORY — DX: Melanoma in situ, unspecified: D03.9

## 2018-02-20 ENCOUNTER — Ambulatory Visit
Admission: RE | Admit: 2018-02-20 | Discharge: 2018-02-20 | Disposition: A | Discharge status: Home / Self Care | Payer: Managed Care, Other (non HMO) | Source: Ambulatory Visit | Attending: Family Medicine | Admitting: Family Medicine

## 2018-02-20 DIAGNOSIS — N6320 Unspecified lump in the left breast, unspecified quadrant: Secondary | ICD-10-CM | POA: Insufficient documentation

## 2018-03-20 ENCOUNTER — Telehealth: Payer: Self-pay | Admitting: Family Medicine

## 2018-03-20 NOTE — Telephone Encounter (Signed)
Copied from [place] [id number]. Topic: Appointment Scheduling - Scheduling Inquiry for Clinic >> Mar 20, 2018  5:40 PM Cecelia Byars, NT wrote: Reason for CRM: Patient thinks she has a broken toe ,and would like to see Dr Danise Mina ,for that and would like also for someone to discuss labs she had done thru [facility], please advise  [phone]

## 2018-03-21 NOTE — Telephone Encounter (Signed)
Unable to reach pt by phone.

## 2018-03-21 NOTE — Telephone Encounter (Signed)
Appointment scheduled.

## 2018-03-30 ENCOUNTER — Encounter: Payer: Self-pay | Admitting: Family Medicine

## 2018-03-30 ENCOUNTER — Ambulatory Visit: Payer: Managed Care, Other (non HMO) | Admitting: Family Medicine

## 2018-03-30 VITALS — BP 138/80 | HR 80 | Temp 99.0°F | Ht 60.0 in | Wt 210.5 lb

## 2018-03-30 DIAGNOSIS — M79676 Pain in unspecified toe(s): Secondary | ICD-10-CM | POA: Diagnosis not present

## 2018-03-30 DIAGNOSIS — T50905A Adverse effect of unspecified drugs, medicaments and biological substances, initial encounter: Secondary | ICD-10-CM

## 2018-03-30 DIAGNOSIS — J3489 Other specified disorders of nose and nasal sinuses: Secondary | ICD-10-CM | POA: Diagnosis not present

## 2018-03-30 DIAGNOSIS — R87610 Atypical squamous cells of undetermined significance on cytologic smear of cervix (ASC-US): Secondary | ICD-10-CM

## 2018-03-30 DIAGNOSIS — E785 Hyperlipidemia, unspecified: Secondary | ICD-10-CM

## 2018-03-30 DIAGNOSIS — L500 Allergic urticaria: Secondary | ICD-10-CM | POA: Diagnosis not present

## 2018-03-30 DIAGNOSIS — Z0000 Encounter for general adult medical examination without abnormal findings: Secondary | ICD-10-CM

## 2018-03-30 NOTE — Patient Instructions (Addendum)
We will call about your referral.  Deborah Brandt or Deborah Brandt will call you if you don't see one of them on the way out.  Check with your insurance about coverage for nutrition evaluation.   Buddy tape or use a hard soled shoe if needed.  Take care.  Glad to see you.

## 2018-03-30 NOTE — Progress Notes (Signed)
D/w pt about prev allergy testing.  She prev passed Amoxicillin challenge, so we have taken Augmentin off of her allergy list.   The majority of her history is not consistent with any true drug allergies. She most likely has delayed drug rashes/hives that are not life-threatening to Levofloxacin, Doxycycline, Erythromycin, and Trimethoprim-Sulfamethoxazole. D/w pt.   Outside labs d/w pt.  D/w pt about diet and exercise.   She has persistent L sided nasal obstruction.  She had surgery many years ago.  She wanted to see ENT again.  She isn't sleeping well due to sx.    She has facial hair and wanted to try home therapy.  D/w pt.    She had mammogram f/u.  She has colonoscopy 11/2017, d/w pt.  She is doing well in the meantime w/o GI sx.   Pap 2018.  She has ASCUS with neg HPV.  Reasonable to recheck Pap with HPV testing in three years.  Hyperlipidemia noted.  Labs discussed with patient.  Discussed with her about gradual weight loss.  She thought she has bilateral weight related thigh pain on standing, not hip pain.  No radicular pain.  D/w pt about diet and exercise.    She thought she possibly broke her R 3rd toe about 3 weeks ago.  No pain now.  D/w pt.  Normal walking now, normal weight bearing.    Meds, vitals, and allergies reviewed.   ROS: Per HPI unless specifically indicated in ROS section   GEN: nad, alert and oriented HEENT: mucous membranes moist, L>R nasal stuffiness NECK: supple w/o LA CV: rrr PULM: ctab, no inc wob ABD: soft, +bs EXT: no edema SKIN: no acute rash R 3rd toe minimally ttp with normal sensation and ROM.

## 2018-04-02 DIAGNOSIS — M79676 Pain in unspecified toe(s): Secondary | ICD-10-CM | POA: Insufficient documentation

## 2018-04-02 DIAGNOSIS — E785 Hyperlipidemia, unspecified: Secondary | ICD-10-CM | POA: Insufficient documentation

## 2018-04-02 DIAGNOSIS — Z0000 Encounter for general adult medical examination without abnormal findings: Secondary | ICD-10-CM | POA: Insufficient documentation

## 2018-04-02 DIAGNOSIS — R87610 Atypical squamous cells of undetermined significance on cytologic smear of cervix (ASC-US): Secondary | ICD-10-CM | POA: Insufficient documentation

## 2018-04-02 DIAGNOSIS — J3489 Other specified disorders of nose and nasal sinuses: Secondary | ICD-10-CM | POA: Insufficient documentation

## 2018-04-02 NOTE — Assessment & Plan Note (Signed)
Labs discussed with patient.  Discussed with her about gradual weight loss with diet and exercise.  Refer for nutrition eval.

## 2018-04-02 NOTE — Assessment & Plan Note (Signed)
At this point only need supportive care.  Can use a hard soled shoe if needed.  Can buddy tape as needed.  No imaging needed now since it was the third toe with the duration noted, with improvement.  She agrees.

## 2018-04-02 NOTE — Assessment & Plan Note (Signed)
Refer to ENT

## 2018-04-02 NOTE — Assessment & Plan Note (Addendum)
I am less concerned with her absolute weight and more concerned with her diet, exercise, functional status.  Discussed patient about diet, exercise, labs, etc.  We can recheck periodically.  Refer for nutrition evaluation.

## 2018-04-02 NOTE — Assessment & Plan Note (Signed)
She had mammogram f/u.  She has colonoscopy 11/2017, d/w pt.  She is doing well in the meantime w/o GI sx.   Pap 2018.  She has ASCUS with neg HPV.  Reasonable to recheck Pap with HPV testing in three years.

## 2018-04-02 NOTE — Assessment & Plan Note (Signed)
2018- She has ASCUS with neg HPV.  Reasonable to recheck Pap with HPV testing in three years.

## 2018-04-02 NOTE — Assessment & Plan Note (Signed)
D/w pt about prev allergy testing.  She prev passed Amoxicillin challenge, so we have taken Augmentin off of her allergy list.   The majority of her history is not consistent with any true drug allergies. She most likely has delayed drug rashes/hives that are not life-threatening to Levofloxacin, Doxycycline, Erythromycin, and Trimethoprim-Sulfamethoxazole. D/w pt.

## 2018-04-06 ENCOUNTER — Encounter: Payer: Self-pay | Admitting: Family Medicine

## 2018-04-06 LAB — LAB REPORT - SCANNED
A1c: 5.9
CHOLESTEROL: 219
GLUCOSE: 83
HDL: 43
LDL (calc): 139
TRIGLYCERIDES: 186

## 2018-07-25 ENCOUNTER — Encounter: Payer: Self-pay | Admitting: Family Medicine

## 2018-07-25 ENCOUNTER — Ambulatory Visit: Payer: Managed Care, Other (non HMO) | Admitting: Family Medicine

## 2018-07-25 VITALS — BP 142/78 | HR 93 | Temp 98.7°F | Ht 60.0 in | Wt 211.5 lb

## 2018-07-25 DIAGNOSIS — L989 Disorder of the skin and subcutaneous tissue, unspecified: Secondary | ICD-10-CM | POA: Diagnosis not present

## 2018-07-25 DIAGNOSIS — R05 Cough: Secondary | ICD-10-CM | POA: Diagnosis not present

## 2018-07-25 DIAGNOSIS — M79646 Pain in unspecified finger(s): Secondary | ICD-10-CM | POA: Diagnosis not present

## 2018-07-25 DIAGNOSIS — R059 Cough, unspecified: Secondary | ICD-10-CM

## 2018-07-25 MED ORDER — AMOXICILLIN-POT CLAVULANATE 875-125 MG PO TABS: 1.0000 | ORAL_TABLET | Freq: Two times a day (BID) | ORAL | 0 refills | Status: DC

## 2018-07-25 MED ORDER — ALBUTEROL SULFATE HFA 108 (90 BASE) MCG/ACT IN AERS: 1.0000 | INHALATION_SPRAY | Freq: Four times a day (QID) | RESPIRATORY_TRACT | 1 refills | Status: DC | PRN

## 2018-07-25 MED ORDER — PREDNISONE 10 MG PO TABS
ORAL_TABLET | ORAL | 0 refills | Status: DC
Start: 2018-07-25 — End: 2019-02-06

## 2018-07-25 NOTE — Progress Notes (Signed)
Cough.  Started about 2 weeks ago.  Started with some wheeze/cough.  Minimal congestion.  No sputum.  Barky cough, worse at night, not sleeping well.  No fevers.  Tired from sleep disruption.  No vomiting, no diarrhea.  No rash.  Less wheeze now, some noted at night.    She has 2 months of L 1st IP joint pain and lack of ROM with prev "popping out." no trauma.    She wanted to see derm about a full body check and get some skin tags off.    Meds, vitals, and allergies reviewed.   ROS: Per HPI unless specifically indicated in ROS section   GEN: nad, alert and oriented HEENT: mucous membranes moist, tm w/o erythema, nasal exam w/o erythema, scant clear discharge noted,  OP  within normal limits nECK: supple w/o LA CV: rrr.   PULM: ctab, no inc wob, but cough noted.  No wheeze EXT: no edema SKIN: no acute rash but multiple skin tags noted Sinuses not tender to palpation. Left thumb with pain on range of motion, with decreased range of motion, at the IP joint

## 2018-07-25 NOTE — Patient Instructions (Addendum)
We will call about your referrals.  Deborah Brandt or Deborah Brandt will call you if you don't see one of them on the way out.   Use albuterol.  If still wheezing, then start prednisone.  If a fever or discolored sputum, then start augmentin.  Update me as needed.   Take care.  Glad to see you.

## 2018-07-26 DIAGNOSIS — M79646 Pain in unspecified finger(s): Secondary | ICD-10-CM | POA: Insufficient documentation

## 2018-07-26 DIAGNOSIS — R05 Cough: Secondary | ICD-10-CM | POA: Insufficient documentation

## 2018-07-26 DIAGNOSIS — R21 Rash and other nonspecific skin eruption: Secondary | ICD-10-CM | POA: Insufficient documentation

## 2018-07-26 DIAGNOSIS — R059 Cough, unspecified: Secondary | ICD-10-CM | POA: Insufficient documentation

## 2018-07-26 DIAGNOSIS — L989 Disorder of the skin and subcutaneous tissue, unspecified: Secondary | ICD-10-CM | POA: Insufficient documentation

## 2018-07-26 NOTE — Assessment & Plan Note (Signed)
Refer to hand clinic.  She agrees.

## 2018-07-26 NOTE — Assessment & Plan Note (Signed)
Refer to [facility] clinic.  She agrees.

## 2018-07-26 NOTE — Assessment & Plan Note (Signed)
Lungs are clear.  She could have residual irritation/reactive airway symptoms from a postinfectious process.  It is not clear to me that she is actively infected.  She is clearly nontoxic.  Okay for outpatient follow-up.  Discussed options.  Use albuterol.  If still wheezing, then start prednisone.  If a fever or discolored sputum, then start augmentin.  Update me as needed.   She agrees with plan.

## 2018-07-28 ENCOUNTER — Telehealth: Payer: Self-pay | Admitting: *Deleted

## 2018-07-28 MED ORDER — BENZONATATE 200 MG PO CAPS
200.0000 mg | ORAL_CAPSULE | Freq: Three times a day (TID) | ORAL | 0 refills | Status: DC | PRN
Start: 2018-07-28 — End: 2019-02-06

## 2018-07-28 NOTE — Telephone Encounter (Signed)
I sent tessalon.  See if that helps the cough.   I didn't send hydrocodone cough syrup given her codeine intolerance.  That in combination with the inhaler and prednisone should help.   Update [place] as needed.   I hope she feels better soon.   Thanks.

## 2018-07-28 NOTE — Telephone Encounter (Signed)
Patient advised.

## 2018-07-28 NOTE — Telephone Encounter (Signed)
Spoke to pt who states she was seen on 12/3 and given an abx and prednisone, which she just started yesterday. She says that her cough has worsened and is requesting to know if Dr Damita Dunnings can send her in a Rx to pharmacy on file; [facility]

## 2018-08-24 ENCOUNTER — Other Ambulatory Visit: Payer: Self-pay | Admitting: Family Medicine

## 2018-08-24 DIAGNOSIS — Z1231 Encounter for screening mammogram for malignant neoplasm of breast: Secondary | ICD-10-CM

## 2018-09-14 ENCOUNTER — Ambulatory Visit
Admission: RE | Admit: 2018-09-14 | Discharge: 2018-09-14 | Disposition: A | Discharge status: Home / Self Care | Payer: Managed Care, Other (non HMO) | Source: Ambulatory Visit | Attending: Family Medicine | Admitting: Family Medicine

## 2018-09-14 DIAGNOSIS — Z1231 Encounter for screening mammogram for malignant neoplasm of breast: Secondary | ICD-10-CM | POA: Insufficient documentation

## 2018-09-15 ENCOUNTER — Other Ambulatory Visit: Payer: Self-pay | Admitting: Family Medicine

## 2018-09-15 DIAGNOSIS — R928 Other abnormal and inconclusive findings on diagnostic imaging of breast: Secondary | ICD-10-CM

## 2018-09-15 DIAGNOSIS — N6310 Unspecified lump in the right breast, unspecified quadrant: Secondary | ICD-10-CM

## 2018-09-22 ENCOUNTER — Ambulatory Visit
Admission: RE | Admit: 2018-09-22 | Discharge: 2018-09-22 | Disposition: A | Discharge status: Home / Self Care | Payer: Managed Care, Other (non HMO) | Source: Ambulatory Visit | Attending: Family Medicine | Admitting: Family Medicine

## 2018-09-22 DIAGNOSIS — N6310 Unspecified lump in the right breast, unspecified quadrant: Secondary | ICD-10-CM

## 2018-09-22 DIAGNOSIS — R928 Other abnormal and inconclusive findings on diagnostic imaging of breast: Secondary | ICD-10-CM | POA: Diagnosis not present

## 2018-09-25 ENCOUNTER — Other Ambulatory Visit: Payer: Self-pay | Admitting: Family Medicine

## 2018-09-25 DIAGNOSIS — N6001 Solitary cyst of right breast: Secondary | ICD-10-CM

## 2018-09-28 ENCOUNTER — Other Ambulatory Visit: Payer: Self-pay

## 2018-09-28 ENCOUNTER — Telehealth: Payer: Self-pay | Admitting: Gastroenterology

## 2018-09-28 MED ORDER — AMOXICILLIN-POT CLAVULANATE 875-125 MG PO TABS: 1.0000 | ORAL_TABLET | Freq: Two times a day (BID) | ORAL | 0 refills | Status: DC

## 2018-09-28 NOTE — Telephone Encounter (Signed)
Pt was seen last March 2019. Should we check to make sure this is in fact diverticulitis before prescribing?

## 2018-09-28 NOTE — Telephone Encounter (Signed)
Pt left vm she states she is having a diverticulitis flare up and Dr. Allen Norris told her to call and he would prescribe rx for her . Send to  [facility]

## 2018-09-28 NOTE — Telephone Encounter (Signed)
Yes. Let's start her on cipro and flagyl if no allergies.

## 2018-09-28 NOTE — Telephone Encounter (Signed)
Pt is allergic to Cipro. Per Dr. Allen Norris, switch to Augmentin for 10 days. Pt notified.

## 2018-09-28 NOTE — Telephone Encounter (Signed)
Pt is calling regarding prev. Message patient was informed we have received her message and Ginger is awaiting on  Dr. Catheryn Bacon response

## 2018-09-29 ENCOUNTER — Other Ambulatory Visit: Payer: Self-pay

## 2018-09-29 ENCOUNTER — Telehealth: Payer: Self-pay | Admitting: Gastroenterology

## 2018-09-29 MED ORDER — AMOXICILLIN-POT CLAVULANATE 875-125 MG PO TABS: 1.0000 | ORAL_TABLET | Freq: Two times a day (BID) | ORAL | 0 refills | Status: DC

## 2018-09-29 NOTE — Telephone Encounter (Signed)
Patient called & l/m on v/m stating Dr Allen Norris was to call her in some Augmentin to the [facility] in [place]. She checked with them last evening and they did not have.

## 2018-09-29 NOTE — Telephone Encounter (Signed)
Contacted pt and informed medication has been resent to her pharmacy.

## 2019-02-06 ENCOUNTER — Other Ambulatory Visit: Payer: Self-pay

## 2019-02-06 ENCOUNTER — Encounter: Payer: Self-pay | Admitting: Family Medicine

## 2019-02-06 ENCOUNTER — Ambulatory Visit: Payer: Managed Care, Other (non HMO) | Admitting: Family Medicine

## 2019-02-06 DIAGNOSIS — H6980 Other specified disorders of Eustachian tube, unspecified ear: Secondary | ICD-10-CM | POA: Diagnosis not present

## 2019-02-06 MED ORDER — PREDNISONE 10 MG PO TABS: ORAL_TABLET | ORAL | 0 refills | Status: DC

## 2019-02-06 NOTE — Progress Notes (Signed)
L sx started about 5 days ago.  Stopped up, with humming sound.  Has been using flonase and neomycin ear drops.  No drainage.  No fevers.  No R ear sx.  No rhinorrhea, no ST.  Some fatigue from sleep disruption.    L nostril stuffier than R at baseline, with ENT f/u pending.    Meds, vitals, and allergies reviewed.   ROS: Per HPI unless specifically indicated in ROS section   nad ncat L TM bulging but only minimal injection.  No erythema o/w.  No TM movement on valsalva.  Mastoid not ttp B   R TM wnl.  B canals wnl.  B pinna wnl.   Nasal exam stuffy.

## 2019-02-06 NOTE — Patient Instructions (Addendum)
Start prednisone with food, stop flonase tomorrow.  Gently try to pop your ears.  Your ears don't look infected.  The pressure should gradually get better.  Update me as needed.   Okay to stop the ear drops.   Take care.  Glad to see you.

## 2019-02-07 DIAGNOSIS — H6980 Other specified disorders of Eustachian tube, unspecified ear: Secondary | ICD-10-CM | POA: Insufficient documentation

## 2019-02-07 NOTE — Assessment & Plan Note (Signed)
Start prednisone with food, okay to stop flonase tomorrow since she'll be on pred by mouth.   Gently try to pop ears with valsalva.  Her ears don't look infected.  The pressure should gradually get better.  Update me as needed.   She agrees.

## 2019-02-13 ENCOUNTER — Telehealth: Payer: Self-pay | Admitting: Family Medicine

## 2019-02-13 NOTE — Telephone Encounter (Signed)
Is there any extra information that needs to go with this note?  If so, then let me know.  If not, then please close.   Thanks.

## 2019-02-13 NOTE — Telephone Encounter (Signed)
Appt 6/16

## 2019-02-20 ENCOUNTER — Other Ambulatory Visit: Payer: Self-pay | Admitting: Otolaryngology

## 2019-02-20 DIAGNOSIS — H9122 Sudden idiopathic hearing loss, left ear: Secondary | ICD-10-CM

## 2019-04-10 LAB — BASIC METABOLIC PANEL
Creatinine: 0.9 (ref 0.5–1.1)
Glucose: 149

## 2019-04-10 LAB — HEMOGLOBIN A1C: Hemoglobin A1C: 6.3

## 2019-04-10 LAB — LIPID PANEL
Cholesterol: 228 — AB (ref 0–200)
HDL: 41 (ref 35–70)
LDL Cholesterol: 116
Triglycerides: 357 — AB (ref 40–160)

## 2019-06-08 ENCOUNTER — Ambulatory Visit: Payer: Managed Care, Other (non HMO) | Admitting: Family Medicine

## 2019-08-07 ENCOUNTER — Encounter: Payer: Self-pay | Admitting: Family Medicine

## 2019-08-07 ENCOUNTER — Other Ambulatory Visit: Payer: Self-pay

## 2019-08-07 ENCOUNTER — Ambulatory Visit: Payer: Managed Care, Other (non HMO) | Admitting: Family Medicine

## 2019-08-07 VITALS — BP 132/86 | HR 90 | Temp 97.0°F | Ht 60.0 in | Wt 214.4 lb

## 2019-08-07 DIAGNOSIS — R300 Dysuria: Secondary | ICD-10-CM | POA: Diagnosis not present

## 2019-08-07 DIAGNOSIS — H9190 Unspecified hearing loss, unspecified ear: Secondary | ICD-10-CM

## 2019-08-07 LAB — POC URINALSYSI DIPSTICK (AUTOMATED)
Bilirubin, UA: NEGATIVE
Glucose, UA: NEGATIVE
Ketones, UA: NEGATIVE
Nitrite, UA: NEGATIVE
Protein, UA: NEGATIVE
Spec Grav, UA: 1.015 (ref 1.010–1.025)
Urobilinogen, UA: 0.2 E.U./dL
pH, UA: 6.5 (ref 5.0–8.0)

## 2019-08-07 MED ORDER — CEPHALEXIN 250 MG PO CAPS: 250.0000 mg | ORAL_CAPSULE | Freq: Two times a day (BID) | ORAL | 0 refills | Status: DC

## 2019-08-07 NOTE — Patient Instructions (Signed)
Drink plenty of water and start the antibiotics today.  We'll contact you with your lab report.  Take care.   

## 2019-08-07 NOTE — Progress Notes (Signed)
This visit occurred during the SARS-CoV-2 public health emergency.  Safety protocols were in place, including screening questions prior to the visit, additional usage of staff PPE, and extensive cleaning of exam room while observing appropriate contact time as indicated for disinfecting solutions.  Dysuria: yes, frequency, pain with urination.   duration of symptoms: 2 days abdominal pain: no fevers:no back pain:no Vomiting:no Urgency: yes.   Started AZO yesterday w/o relief.    She had ENT f/u in the meantime.  She has talked to hearing aid supplier, she got a temporary device and it helped.  She still has ear ringing and L sided hearing loss.  She needs a referral to audiology.  Referral placed.  Meds, vitals, and allergies reviewed.  Per HPI unless specifically indicated in ROS section   GEN: nad, alert and oriented HEENT: ncat NECK: supple CV: rrr.  PULM: ctab, no inc wob ABD: soft, +bs, suprapubic area not tender EXT: no edema SKIN: no acute rash BACK: no CVA pain

## 2019-08-08 DIAGNOSIS — R300 Dysuria: Secondary | ICD-10-CM | POA: Insufficient documentation

## 2019-08-08 DIAGNOSIS — H9190 Unspecified hearing loss, unspecified ear: Secondary | ICD-10-CM | POA: Insufficient documentation

## 2019-08-08 NOTE — Assessment & Plan Note (Signed)
Refer to audiology.

## 2019-08-08 NOTE — Assessment & Plan Note (Signed)
Presumed cystitis.  Nontoxic.  Okay for outpatient follow-up.  Start Keflex.  Drink plenty of fluid.  Check urine culture.  She agrees.  Urinalysis discussed with patient.

## 2019-08-10 ENCOUNTER — Telehealth: Payer: Self-pay

## 2019-08-10 LAB — URINE CULTURE
MICRO NUMBER:: 1199395
SPECIMEN QUALITY:: ADEQUATE

## 2019-08-10 NOTE — Telephone Encounter (Signed)
Pt said she has a lot of allergies to abx; on 08/07/19 pt started taking Keflex; on 08/09/19 pt's face began to have a burning sensation; now pt's face still burning, face is red and swollen,lips are slightly swollen,pts cheeks, nose, and chin are cracking and bleeding.pt has no hives and no difficulty breathing or swallowing. Pt said she looks terrible; advised pt not take anymore of the Keflex and pt is going to [facility] ED now. Allergy list updated.FYI to Dr Damita Dunnings.

## 2019-08-10 NOTE — Telephone Encounter (Signed)
Noted.  She had prev allergy eval re: amoxil and it was thought that keflex would be acceptable based on available information.  Given the recent changes, agree with ER. I'll await the ER notes.  Please get update on patient Monday.  Thanks.

## 2019-08-13 NOTE — Telephone Encounter (Signed)
Given her recent hx, I would avoid any extra abx if she has no sx currently, esp if she was able to take 3 days worth.  If she has new sx in the meantime, then let me know.  Thanks.    I am glad she is feeling better.

## 2019-08-13 NOTE — Telephone Encounter (Signed)
Patient says she is fine.  She stopped the ABX and took Benadryl and she is fine.  However, she wonders if the 3 days of Keflex was enough to treat the UTI.  She is not having symptoms of UTI right now.  Please advise.

## 2019-08-13 NOTE — Telephone Encounter (Signed)
Patient advised.

## 2019-11-20 ENCOUNTER — Telehealth: Payer: Self-pay | Admitting: Family Medicine

## 2019-11-20 NOTE — Telephone Encounter (Signed)
Please call patient. She is likely overdue for follow-up breast imaging. Please have her call the breast imaging site to schedule.  Thanks.

## 2019-11-20 NOTE — Telephone Encounter (Signed)
Patient advised.

## 2019-11-28 ENCOUNTER — Other Ambulatory Visit: Payer: Self-pay | Admitting: Family Medicine

## 2019-11-28 DIAGNOSIS — R928 Other abnormal and inconclusive findings on diagnostic imaging of breast: Secondary | ICD-10-CM

## 2019-11-28 DIAGNOSIS — N6001 Solitary cyst of right breast: Secondary | ICD-10-CM

## 2019-11-28 DIAGNOSIS — Z1231 Encounter for screening mammogram for malignant neoplasm of breast: Secondary | ICD-10-CM

## 2019-12-04 ENCOUNTER — Ambulatory Visit (INDEPENDENT_AMBULATORY_CARE_PROVIDER_SITE_OTHER): Payer: Self-pay

## 2019-12-04 ENCOUNTER — Other Ambulatory Visit: Payer: Self-pay

## 2019-12-04 DIAGNOSIS — L680 Hirsutism: Secondary | ICD-10-CM

## 2019-12-17 ENCOUNTER — Ambulatory Visit
Admission: RE | Admit: 2019-12-17 | Discharge: 2019-12-17 | Disposition: A | Discharge status: Home / Self Care | Payer: Managed Care, Other (non HMO) | Source: Ambulatory Visit | Attending: Family Medicine | Admitting: Family Medicine

## 2019-12-17 DIAGNOSIS — N6001 Solitary cyst of right breast: Secondary | ICD-10-CM

## 2019-12-17 DIAGNOSIS — R928 Other abnormal and inconclusive findings on diagnostic imaging of breast: Secondary | ICD-10-CM | POA: Diagnosis present

## 2019-12-17 DIAGNOSIS — Z1231 Encounter for screening mammogram for malignant neoplasm of breast: Secondary | ICD-10-CM | POA: Diagnosis present

## 2020-01-01 ENCOUNTER — Ambulatory Visit (INDEPENDENT_AMBULATORY_CARE_PROVIDER_SITE_OTHER): Payer: Self-pay

## 2020-01-01 ENCOUNTER — Other Ambulatory Visit: Payer: Self-pay

## 2020-01-01 DIAGNOSIS — L680 Hirsutism: Secondary | ICD-10-CM

## 2020-01-16 ENCOUNTER — Encounter: Payer: Self-pay | Admitting: Dermatology

## 2020-01-16 ENCOUNTER — Ambulatory Visit: Payer: Managed Care, Other (non HMO) | Admitting: Dermatology

## 2020-01-16 ENCOUNTER — Other Ambulatory Visit: Payer: Self-pay

## 2020-01-16 DIAGNOSIS — L578 Other skin changes due to chronic exposure to nonionizing radiation: Secondary | ICD-10-CM

## 2020-01-16 DIAGNOSIS — L918 Other hypertrophic disorders of the skin: Secondary | ICD-10-CM

## 2020-01-16 DIAGNOSIS — L219 Seborrheic dermatitis, unspecified: Secondary | ICD-10-CM | POA: Diagnosis not present

## 2020-01-16 DIAGNOSIS — Z1283 Encounter for screening for malignant neoplasm of skin: Secondary | ICD-10-CM | POA: Diagnosis not present

## 2020-01-16 DIAGNOSIS — D2362 Other benign neoplasm of skin of left upper limb, including shoulder: Secondary | ICD-10-CM

## 2020-01-16 DIAGNOSIS — L820 Inflamed seborrheic keratosis: Secondary | ICD-10-CM

## 2020-01-16 DIAGNOSIS — L719 Rosacea, unspecified: Secondary | ICD-10-CM

## 2020-01-16 DIAGNOSIS — L814 Other melanin hyperpigmentation: Secondary | ICD-10-CM

## 2020-01-16 DIAGNOSIS — L853 Xerosis cutis: Secondary | ICD-10-CM

## 2020-01-16 DIAGNOSIS — D1801 Hemangioma of skin and subcutaneous tissue: Secondary | ICD-10-CM

## 2020-01-16 DIAGNOSIS — D239 Other benign neoplasm of skin, unspecified: Secondary | ICD-10-CM

## 2020-01-16 DIAGNOSIS — L821 Other seborrheic keratosis: Secondary | ICD-10-CM

## 2020-01-16 DIAGNOSIS — D229 Melanocytic nevi, unspecified: Secondary | ICD-10-CM

## 2020-01-16 NOTE — Progress Notes (Signed)
   Follow-Up Visit   Subjective  Deborah Brandt is a 54 y.o. female who presents for the following: pink scaly spot (Right cheek x 3 weeks) and TBSE (brown spots on abdomen, right thigh, and back, she would like to have checked out. Have been there for years, no changes in these spots is noted by patient).  Patient states that she has no history of skin cancer.  The following portions of the chart were reviewed this encounter and updated as appropriate:      Review of Systems:  No other skin or systemic complaints except as noted in HPI or Assessment and Plan.  Objective  Well appearing patient in no apparent distress; mood and affect are within normal limits.  A full examination was performed including scalp, head, eyes, ears, nose, lips, neck, chest, axillae, abdomen, back, buttocks, bilateral upper extremities, bilateral lower extremities, hands, feet, fingers, toes, fingernails, and toenails. All findings within normal limits unless otherwise noted below.  Objective  Right Thigh - Anterior: Fleshy, skin-colored pedunculated papule.    Objective  Left Shoulder - Posterior: Firm pink/brown papulenodule with dimple sign.   Objective  Left Ankle - Anterior: Erythematous keratotic waxy stuck-on papule. Irritated by shaving.  Objective  Cheeks and chin: Mid face erythema with telangiectasias   Objective  Right Malar Cheek: Pink scaly macule   Assessment & Plan  Achrochordon Right Thigh - Anterior  Benign, observe.  Discussed snip excision if irritated  Dermatofibroma Left Shoulder - Posterior  Benign, observe.    Inflamed seborrheic keratosis Left Ankle - Anterior  Discussed cryotherapy today, patient defers at this time since traveling this weekend  Rosacea Cheeks and chin  Discussed BBL treatment, multiple txs needed for best results- she will address with Sonia Baller at her next [facility] appointment  Sunscreen to face daily  Seborrheic dermatitis Right Malar  Cheek  Vs early AK Start Cloderm cream once to twice daily till clear or 2 weeks- sample given, if does not resolve will consider cryotherapy  Lentigines - Scattered tan macules - Discussed due to sun exposure - Benign, observe - Call for any changes  Seborrheic Keratoses - Stuck-on, waxy, tan-brown papules and plaques abdomen, thigh, back - Discussed benign etiology and prognosis. - Observe - Call for any changes  Melanocytic Nevi - Tan-brown and/or pink-flesh-colored symmetric macules and papules - Benign appearing on exam today - Observation - Call clinic for new or changing moles - Recommend daily use of broad spectrum spf 30+ sunscreen to sun-exposed areas.   Hemangiomas - Red papules - Discussed benign nature - Observe - Call for any changes  Actinic Damage - diffuse scaly erythematous macules with underlying dyspigmentation - Recommend daily broad spectrum sunscreen SPF 30+ to sun-exposed areas, reapply every 2 hours as needed.  - Call for new or changing lesions.  Xerosis - diffuse xerotic patches - recommend gentle, hydrating skin care - gentle skin care handout given   Skin cancer screening performed today.   Return if symptoms worsen or fail to improve, for pt will return if spot on face doesn't clear.   Marene Lenz, CMA, am acting as scribe for Brendolyn Patty, MD .  Documentation: I have reviewed the above documentation for accuracy and completeness, and I agree with the above.  Brendolyn Patty MD

## 2020-01-16 NOTE — Patient Instructions (Addendum)
Recommend daily broad spectrum sunscreen SPF 30+ to sun-exposed areas, reapply every 2 hours as needed. Call for new or changing lesions.  Seborrheic Keratoses - Stuck-on, waxy, tan-brown papules and plaques  - Discussed benign etiology and prognosis. - Observe - Call for any changes

## 2020-01-28 ENCOUNTER — Other Ambulatory Visit: Payer: Self-pay

## 2020-01-28 ENCOUNTER — Ambulatory Visit (INDEPENDENT_AMBULATORY_CARE_PROVIDER_SITE_OTHER): Payer: Self-pay

## 2020-01-28 DIAGNOSIS — L680 Hirsutism: Secondary | ICD-10-CM

## 2020-01-28 NOTE — Progress Notes (Signed)
Discussed BBL for Rosacea. Pt will begin tx's in the fall. jj

## 2020-03-10 ENCOUNTER — Other Ambulatory Visit: Payer: Self-pay

## 2020-03-10 ENCOUNTER — Ambulatory Visit (INDEPENDENT_AMBULATORY_CARE_PROVIDER_SITE_OTHER): Payer: Managed Care, Other (non HMO)

## 2020-03-10 DIAGNOSIS — L680 Hirsutism: Secondary | ICD-10-CM

## 2020-03-10 NOTE — Progress Notes (Signed)
Pt states that she doesn't think she's seeing the improvement that she'd hoped for. I explained that treatment on the face can vary as it is usually a hormonal issue. jj

## 2020-04-14 ENCOUNTER — Other Ambulatory Visit: Payer: Self-pay

## 2020-04-14 ENCOUNTER — Other Ambulatory Visit: Payer: Self-pay | Admitting: Family Medicine

## 2020-04-14 ENCOUNTER — Encounter: Payer: Self-pay | Admitting: Family Medicine

## 2020-04-14 ENCOUNTER — Telehealth (INDEPENDENT_AMBULATORY_CARE_PROVIDER_SITE_OTHER): Payer: Managed Care, Other (non HMO) | Admitting: Family Medicine

## 2020-04-14 DIAGNOSIS — J3489 Other specified disorders of nose and nasal sinuses: Secondary | ICD-10-CM

## 2020-04-14 DIAGNOSIS — E781 Pure hyperglyceridemia: Secondary | ICD-10-CM

## 2020-04-14 DIAGNOSIS — R739 Hyperglycemia, unspecified: Secondary | ICD-10-CM

## 2020-04-14 MED ORDER — IPRATROPIUM BROMIDE 0.06 % NA SOLN: 2.0000 | Freq: Two times a day (BID) | NASAL | 1 refills | Status: DC

## 2020-04-14 NOTE — Progress Notes (Signed)
Virtual visit completed through WebEx or similar program Patient location: home  Provider location: [place] at [facility], office  Participants: Patient and me (unless stated otherwise below)  Pandemic considerations d/w pt.   Limitations and rationale for visit method d/w patient.  Patient agreed to proceed.   CC: sinus pressure.   HPI:  She has considered sinus surgery in the past but she wanted to defer surgery if possible. She has seen ENT prev.  She has used afrin a few times a month.  She has persistent nasal stuffiness on the left side, with dec air flow in L nostril.  No FCNAVD.  She doesn't feel unwell.  No ST, no cough.    flonase didn't help prev.  D/w pt about atrovent trial.  She'll udpate me as needed.    She prev passed amoxil challenge.    covid vaccine d/w pt.  Hx updated.    She is going to schedule a physical when possible.  D/w pt.    Meds and allergies reviewed.   ROS: Per HPI unless specifically indicated in ROS section   NAD Speech wnl  A/P:  Nasal stuffiness/obstruction.   She has considered sinus surgery in the past but she wanted to defer surgery if possible. She has seen ENT prev.  She has used afrin a few times a month.  She has persistent nasal stuffiness on the left side, with dec air flow in L nostril. Flonase didn't help prev.  D/w pt about atrovent trial.  Low risk med, worth trying, d/w pt.  She'll udpate me as needed.  She agrees.    She is going to schedule a physical when possible.  D/w pt.

## 2020-04-14 NOTE — Assessment & Plan Note (Signed)
She has considered sinus surgery in the past but she wanted to defer surgery if possible. She has seen ENT prev.  She has used afrin a few times a month.  She has persistent nasal stuffiness on the left side, with dec air flow in L nostril. Flonase didn't help prev.  D/w pt about atrovent trial.  Low risk med, worth trying, d/w pt.  She'll udpate me as needed.  She agrees.

## 2020-04-18 ENCOUNTER — Other Ambulatory Visit: Payer: Self-pay

## 2020-04-18 ENCOUNTER — Other Ambulatory Visit (INDEPENDENT_AMBULATORY_CARE_PROVIDER_SITE_OTHER): Payer: Managed Care, Other (non HMO)

## 2020-04-18 DIAGNOSIS — E781 Pure hyperglyceridemia: Secondary | ICD-10-CM

## 2020-04-18 DIAGNOSIS — R739 Hyperglycemia, unspecified: Secondary | ICD-10-CM | POA: Diagnosis not present

## 2020-04-18 NOTE — Addendum Note (Signed)
Addended by: Tammi Sou on: 04/18/2020 08:50 AM   Modules accepted: Orders

## 2020-04-19 LAB — LIPID PANEL
Chol/HDL Ratio: 5.1 ratio — ABNORMAL HIGH (ref 0.0–4.4)
Cholesterol, Total: 216 mg/dL — ABNORMAL HIGH (ref 100–199)
HDL: 42 mg/dL (ref >39)
LDL Chol Calc (NIH): 142 mg/dL — ABNORMAL HIGH (ref 0–99)
Triglycerides: 180 mg/dL — ABNORMAL HIGH (ref 0–149)
VLDL Cholesterol Cal: 32 mg/dL (ref 5–40)

## 2020-04-19 LAB — HEMOGLOBIN A1C
Est. average glucose Bld gHb Est-mCnc: 131 mg/dL
Hgb A1c MFr Bld: 6.2 % — ABNORMAL HIGH (ref 4.8–5.6)

## 2020-04-19 LAB — COMPREHENSIVE METABOLIC PANEL
ALT: 32 IU/L (ref 0–32)
AST: 23 IU/L (ref 0–40)
Albumin/Globulin Ratio: 2.3 — ABNORMAL HIGH (ref 1.2–2.2)
Albumin: 4.6 g/dL (ref 3.8–4.9)
Alkaline Phosphatase: 58 IU/L (ref 48–121)
BUN/Creatinine Ratio: 14 (ref 9–23)
BUN: 11 mg/dL (ref 6–24)
Bilirubin Total: 0.3 mg/dL (ref 0.0–1.2)
CO2: 24 mmol/L (ref 20–29)
Calcium: 9.7 mg/dL (ref 8.7–10.2)
Chloride: 101 mmol/L (ref 96–106)
Creatinine, Ser: 0.8 mg/dL (ref 0.57–1.00)
GFR calc Af Amer: 96 mL/min/{1.73_m2} (ref >59)
GFR calc non Af Amer: 83 mL/min/{1.73_m2} (ref >59)
Globulin, Total: 2 g/dL (ref 1.5–4.5)
Glucose: 105 mg/dL — ABNORMAL HIGH (ref 65–99)
Potassium: 4.7 mmol/L (ref 3.5–5.2)
Sodium: 140 mmol/L (ref 134–144)
Total Protein: 6.6 g/dL (ref 6.0–8.5)

## 2020-04-24 ENCOUNTER — Ambulatory Visit (INDEPENDENT_AMBULATORY_CARE_PROVIDER_SITE_OTHER): Payer: Managed Care, Other (non HMO) | Admitting: Family Medicine

## 2020-04-24 ENCOUNTER — Other Ambulatory Visit: Payer: Self-pay

## 2020-04-24 ENCOUNTER — Encounter: Payer: Self-pay | Admitting: Family Medicine

## 2020-04-24 VITALS — BP 136/82 | HR 87 | Temp 96.6°F | Ht 60.0 in | Wt 213.6 lb

## 2020-04-24 DIAGNOSIS — R87610 Atypical squamous cells of undetermined significance on cytologic smear of cervix (ASC-US): Secondary | ICD-10-CM

## 2020-04-24 DIAGNOSIS — Z0000 Encounter for general adult medical examination without abnormal findings: Secondary | ICD-10-CM

## 2020-04-24 DIAGNOSIS — Z7189 Other specified counseling: Secondary | ICD-10-CM

## 2020-04-24 DIAGNOSIS — Z124 Encounter for screening for malignant neoplasm of cervix: Secondary | ICD-10-CM

## 2020-04-24 DIAGNOSIS — J3489 Other specified disorders of nose and nasal sinuses: Secondary | ICD-10-CM

## 2020-04-24 NOTE — Patient Instructions (Addendum)
Please sign a record request from Dr. Nehemiah Massed to get your last 3 years of records.   We'll update your about your pap results.   Keep working on diet and exercise.  Take care.  Glad to see you.

## 2020-04-24 NOTE — Progress Notes (Signed)
This visit occurred during the SARS-CoV-2 public health emergency.  Safety protocols were in place, including screening questions prior to the visit, additional usage of staff PPE, and extensive cleaning of exam room while observing appropriate contact time as indicated for disinfecting solutions.  CPE- See plan.  Routine anticipatory guidance given to patient.  See health maintenance.  The possibility exists that previously documented standard health maintenance information may have been brought forward from a previous encounter into this note.  If needed, that same information has been updated to reflect the current situation based on today's encounter.    Tetanus 2018 Flu d/w pt.  Encouraged.   PNA not due, d/w pt.  Shingles not due, d/w pt.  covid vaccine 2021.   Colonoscopy 2019 Mammogram 2021 Pap due.  D/w pt.  h/o ASCUS but HPV neg.   DXA not due.   Living will d/w pt. Boyfriend Clent Demark designated if patient were incapacitated. Diet and exercise.  D/w pt.  "I could do better."  She has been working on diet.  She has been walking more.   Minimal inc in sugar and lipids, d/w pt.  tx is diet and exercise.    Nasal stuffiness/obstruction.  she started atrovent nasal spray.  Not worse in the meantime but still with L ear pressure noted.   Flonase didn't help prev. She may end up needing to talk to ENT, d/w pt.    She has routine f/u with Dr. Nehemiah Massed with dermatology.    PMH and SH reviewed  Meds, vitals, and allergies reviewed.   ROS: Per HPI.  Unless specifically indicated otherwise in HPI, the patient denies:  General: fever. Eyes: acute vision changes ENT: sore throat Cardiovascular: chest pain Respiratory: SOB GI: vomiting GU: dysuria Musculoskeletal: acute back pain Derm: acute rash Neuro: acute motor dysfunction Psych: worsening mood Endocrine: polydipsia Heme: bleeding Allergy: hayfever  GEN: nad, alert and oriented HEENT: ncat NECK: supple w/o LA CV:  rrr. PULM: ctab, no inc wob ABD: soft, +bs EXT: no edema SKIN: no acute rash Normal introitus for age, no external lesions, no vaginal discharge, mucosa pink and moist, no vaginal or cervical lesions, no vaginal atrophy, no friaility or hemorrhage, normal uterus size and position, no adnexal masses or tenderness.  Chaperoned exam.  Pap collected.    The 10-year ASCVD risk score Mikey Bussing DC Brooke Bonito., et al., 2013) is: 3.1%   Values used to calculate the score:     Age: 54 years     Sex: Female     Is Non-Hispanic African American: No     Diabetic: No     Tobacco smoker: No     Systolic Blood Pressure: 384 mmHg     Is BP treated: No     HDL Cholesterol: 42 mg/dL     Total Cholesterol: 216 mg/dL

## 2020-04-25 LAB — IGP, RFX APTIMA HPV ASCU

## 2020-04-28 NOTE — Assessment & Plan Note (Signed)
History of.  See follow-up Pap results from today.

## 2020-04-28 NOTE — Assessment & Plan Note (Signed)
Tetanus 2018 Flu d/w pt.  Encouraged.   PNA not due, d/w pt.  Shingles not due, d/w pt.  covid vaccine 2021.   Colonoscopy 2019 Mammogram 2021 Pap due.  D/w pt.  h/o ASCUS but HPV neg.   DXA not due.   Living will d/w pt. Boyfriend Deborah Brandt designated if patient were incapacitated. Diet and exercise.  D/w pt.  "I could do better."  She has been working on diet.  She has been walking more.   Minimal inc in sugar and lipids, d/w pt.  tx is diet and exercise.

## 2020-04-28 NOTE — Assessment & Plan Note (Signed)
Nasal stuffiness/obstruction.  she started atrovent nasal spray.  Not worse in the meantime but still with L ear pressure noted.   Flonase didn't help prev. She may end up needing to talk to ENT, d/w pt. she will update me if she does not improve with Atrovent.

## 2020-04-28 NOTE — Assessment & Plan Note (Signed)
Living will d/w pt. Boyfriend David Burge designated if patient were incapacitated. 

## 2020-05-12 ENCOUNTER — Ambulatory Visit: Payer: Managed Care, Other (non HMO) | Admitting: Dermatology

## 2020-06-24 ENCOUNTER — Other Ambulatory Visit: Payer: Self-pay | Admitting: Family Medicine

## 2020-09-12 ENCOUNTER — Telehealth (INDEPENDENT_AMBULATORY_CARE_PROVIDER_SITE_OTHER): Payer: Managed Care, Other (non HMO) | Admitting: Family Medicine

## 2020-09-12 ENCOUNTER — Other Ambulatory Visit: Payer: Self-pay

## 2020-09-12 ENCOUNTER — Encounter: Payer: Self-pay | Admitting: Family Medicine

## 2020-09-12 DIAGNOSIS — K5792 Diverticulitis of intestine, part unspecified, without perforation or abscess without bleeding: Secondary | ICD-10-CM

## 2020-09-12 MED ORDER — AMOXICILLIN-POT CLAVULANATE 875-125 MG PO TABS: 1.0000 | ORAL_TABLET | Freq: Two times a day (BID) | ORAL | 0 refills | Status: DC

## 2020-09-12 NOTE — Progress Notes (Signed)
Interactive audio and video telecommunications were attempted between this provider and patient, however failed, due to patient having technical difficulties OR patient did not have access to video capability.  We continued and completed visit with audio only.   Virtual Visit via Telephone Note  I connected with patient on 09/12/20  at 4:12 PM  by telephone and verified that I am speaking with the correct person using two identifiers.  Location of patient:  Home  Location of MD: [facility] Name of referring provider (if blank then none associated): Names per persons and role in encounter:  MD: Earlyne Iba, Patient: name listed above.    I discussed the limitations, risks, security and privacy concerns of performing an evaluation and management service by telephone and the availability of in person appointments. I also discussed with the patient that there may be a patient responsible charge related to this service. The patient expressed understanding and agreed to proceed.  CC: abd pain.   History of Present Illness:   She is able to tolerate augmentin, d/w pt.    Abd pain. Started about 2 days ago.  Worse today.  From ribs to the hips "like I've been doing sit up."  Cramping.  No vomiting.  Pain in LLQ.  H/o diverticulitis, this feel similar.  Taking ibuprofen for pain, alternating with tylenol.  Pain is better now.  Possibly had a fever two nights ago.  Not SOB.  Last BM was this AM.  No blood in stool.  No black stools.  Taking clear fluids.  R side of abd not as ttp as the L.  No burning with urination.     Observations/Objective: No apparent distress Speech normal  Assessment and Plan: Presumed diverticulitis.  Clear liquids and start augmentin in the meantime with routine cautions.  She'll seek eval if worse in the meantime.    Follow Up Instructions: see above.    I discussed the assessment and treatment plan with the patient. The patient was provided an opportunity to  ask questions and all were answered. The patient agreed with the plan and demonstrated an understanding of the instructions.   The patient was advised to call back or seek an in-person evaluation if the symptoms worsen or if the condition fails to improve as anticipated.  I provided 13 minutes of non-face-to-face time during this encounter.  Elsie Stain, MD

## 2020-09-12 NOTE — Progress Notes (Signed)
Patient has been having problems with her diverticulitis since Wednesday. Patient has been having abdominal pain, pressure and bloating. Patient has been taking ibuprofen; pain earlier was at a 10 but now a 6.

## 2020-09-14 NOTE — Assessment & Plan Note (Signed)
Presumed diverticulitis.  Clear liquids and start augmentin in the meantime with routine cautions.  She'll seek eval if worse in the meantime.

## 2020-11-20 ENCOUNTER — Telehealth: Payer: Self-pay | Admitting: Family Medicine

## 2020-11-20 MED ORDER — AMOXICILLIN-POT CLAVULANATE 875-125 MG PO TABS: 1.0000 | ORAL_TABLET | Freq: Two times a day (BID) | ORAL | 0 refills | Status: DC

## 2020-11-20 NOTE — Telephone Encounter (Signed)
I sent the abx preemptively but please triage patient and given her routine ER cautions. Thanks.

## 2020-11-20 NOTE — Telephone Encounter (Signed)
Pt called in she is having diverticulosis issues and she stated that normally Dr. Damita Dunnings gives her antibiotics for it, she has a apt on 4/4 @1030  but she wanted to know about getting something now.  Please advise.

## 2020-11-20 NOTE — Telephone Encounter (Signed)
I spoke with Deborah Brandt; Deborah Brandt said starting on 11/18/20 hadsharpe pain on lt side when moves and if not moving has a dull achy pain on lt side. The pain is consistent. Deborah Brandt said this is the 4th time Deborah Brandt has had these symptoms since Jan 2022. Deborah Brandt has appt on 11/24/2020. Deborah Brandt has not taken temp but Deborah Brandt feels cold so Deborah Brandt thinks she may have a fever. No diarrhea or constipation; Deborah Brandt had normal BM on 11/19/20. Deborah Brandt has been on liquid diet since pain began on 11/18/20. No N or V. Deborah Brandt feels sore in abd and feels generally exhausted. Deborah Brandt plans to pick up abx today and begin the abx. Pain level now is 6. ED precautions given and Deborah Brandt voiced understanding. Deborah Brandt will keep video visit on 11/24/20 since this is 4th occurrence this year in 2022. Deborah Brandt said sometimes has problems with video connection and if so Deborah Brandt request Dr Damita Dunnings to call Deborah Brandt on phone on 11/24/20. Sending note to Dr Damita Dunnings. Deborah Brandt also has appt with GI on 12/10/20.

## 2020-11-21 NOTE — Telephone Encounter (Signed)
Noted.  Agreed.  Thanks. 

## 2020-11-24 ENCOUNTER — Encounter: Payer: Self-pay | Admitting: Family Medicine

## 2020-11-24 ENCOUNTER — Telehealth (INDEPENDENT_AMBULATORY_CARE_PROVIDER_SITE_OTHER): Payer: Managed Care, Other (non HMO) | Admitting: Family Medicine

## 2020-11-24 ENCOUNTER — Other Ambulatory Visit: Payer: Self-pay

## 2020-11-24 DIAGNOSIS — R1032 Left lower quadrant pain: Secondary | ICD-10-CM

## 2020-11-24 DIAGNOSIS — R011 Cardiac murmur, unspecified: Secondary | ICD-10-CM | POA: Insufficient documentation

## 2020-11-24 NOTE — Progress Notes (Signed)
Virtual visit completed through WebEx or similar program Patient location: home  Provider location: [facility] at [facility], office  Participants: Patient and me (unless stated otherwise below)  Pandemic considerations d/w pt.   Limitations and rationale for visit method d/w patient.  Patient agreed to proceed.   CC: abd sx  HPI: d/w pt about scheduling mammogram. She'll call about that.  She'll update me as needed.  She agrees with plan.  She had gotten a reminder.    D/w pt about recent sx.  H/o diverticulitis.  This felt similar.  She likely had multiple flares this year.  She called about f/u with GI.  Has f/u pending for 4/20 with GI.  Current on agumentin.  She improved in the meantime, quickly with augmentin start.  She had twinges initially, then had more pain in LLQ last week, similar to prev.  No fevers now but did on 11/20/20.  She clearly feels better now.  No vomiting, no diarrhea, no blood in stool.  BM this AM.  She cut back to clear liquids when sx started, then was able to ADAT when feeling better.  No abd pain unless she presses the area now, w/o rebound.  Initially any movement hurt, last week.  No rash. No dysuria.    Meds and allergies reviewed.   ROS: Per HPI unless specifically indicated in ROS section   NAD Speech wnl  A/P: Left lower quadrant pain, improved on Augmentin and liquid diet.  Presumed diverticulitis.  Still improving and okay for outpatient follow-up.  She is going to follow-up with GI.  She will update me as needed and I will await the consult note.  Routine cautions given to patient.  She will call about her mammogram.

## 2020-11-24 NOTE — Progress Notes (Signed)
Patient states she is having diverticulitis symptoms since starting augmentin Thursday. Patient has had soreness on left side and a hard/stiff abdomen since Thursday.

## 2020-11-26 NOTE — Assessment & Plan Note (Signed)
Left lower quadrant pain, improved on Augmentin and liquid diet.  Presumed diverticulitis.  Still improving and okay for outpatient follow-up.  She is going to follow-up with GI.  She will update me as needed and I will await the consult note.  Routine cautions given to patient.

## 2020-12-10 ENCOUNTER — Encounter: Payer: Self-pay | Admitting: Gastroenterology

## 2020-12-10 ENCOUNTER — Ambulatory Visit
Admission: RE | Admit: 2020-12-10 | Discharge: 2020-12-10 | Disposition: A | Discharge status: Home / Self Care | Payer: Managed Care, Other (non HMO) | Source: Ambulatory Visit | Attending: Gastroenterology | Admitting: Gastroenterology

## 2020-12-10 ENCOUNTER — Ambulatory Visit: Payer: Managed Care, Other (non HMO) | Admitting: Gastroenterology

## 2020-12-10 ENCOUNTER — Other Ambulatory Visit: Payer: Self-pay

## 2020-12-10 VITALS — BP 115/78 | HR 76 | Temp 98.1°F | Ht 60.0 in | Wt 209.8 lb

## 2020-12-10 DIAGNOSIS — R1084 Generalized abdominal pain: Secondary | ICD-10-CM | POA: Diagnosis not present

## 2020-12-10 DIAGNOSIS — K5792 Diverticulitis of intestine, part unspecified, without perforation or abscess without bleeding: Secondary | ICD-10-CM

## 2020-12-10 MED ORDER — IOHEXOL 300 MG/ML  SOLN
100.0000 mL | Freq: Once | INTRAMUSCULAR | Status: AC | PRN
  Administered 2020-12-10: 100 mL via INTRAVENOUS

## 2020-12-10 MED ORDER — AMOXICILLIN-POT CLAVULANATE 875-125 MG PO TABS
1.0000 | ORAL_TABLET | Freq: Two times a day (BID) | ORAL | 0 refills | Status: DC
Start: 2020-12-10 — End: 2020-12-17

## 2020-12-10 NOTE — Progress Notes (Signed)
Primary Care Physician: Tonia Ghent, MD  Primary Gastroenterologist:  Dr. Lucilla Lame  Chief Complaint  Patient presents with  . Diverticulitis    HPI: Deborah Brandt is a 55 y.o. female here for recurrent diverticulitis. The patient reports that she is put on antibiotic multiple times and has had recurrent diverticulitis with 5 recent episodes.  The patient has been treated by her primary care physician with antibiotics the past.  The patient reports that she started to have symptoms again last week and it lasted for a few days.  She states that her symptoms are typically left sided abdominal pain and she typically also has fevers and chills at the same time.  There is no report of any unexplained weight loss black stools or bloody stools.  The patient states that she is tired of her recurrent diverticulitis and once to pursue that he use to prevent this in the future.  Past Medical History:  Diagnosis Date  . Complication of anesthesia    after laparoscopy(1988) BP dropped.  Low BP after epidurals for childbirth  . Diverticulitis   . Dysplastic nevus 02/21/2017   R lat pubic groin - mod  . Dysplastic nevus 02/21/2017   L sup buttocks crease - mild   . Dysplastic nevus 04/04/2017   RUQA - mod to severe 05/03/2017 excision  . Dysplastic nevus 04/04/2017   R prox ant lat thigh - mild  . Dysplastic nevus 07/18/2017   L side inf costal margin - mild   . Dysplastic nevus 10/26/2017   R lat prox thigh near buttocks - mod  . Dysplastic nevus 04/27/2018   L mid ant lat thigh - mod  . Dysplastic nevus 07/26/2018   R ant med prox mid thigh - mod  . Heart murmur    at birth. no recent mention  . HLD (hyperlipidemia)   . Melanoma ([facility]) 02/22/2017   R post prox lat thigh - Early invasive, Breslow's 0.80mm, Clark level 2 - excision  . Melanoma in situ ([facility]) 02/07/2018   L distal tricep - MMIS arising in a dysplastic nevus - excision  . Migraine   . Motion sickness    boats, back  seat of car  . Renal stones     No current outpatient medications on file.   No current facility-administered medications for this visit.    Allergies as of 12/10/2020 - Review Complete 12/10/2020  Allergen Reaction Noted  . Ciprofloxacin Hives and Nausea And Vomiting 09/20/2017  . Levaquin [levofloxacin] Hives 04/04/2015  . Sulfa antibiotics Hives 04/04/2015  . Codeine Nausea And Vomiting 04/04/2015  . Doxycycline Hives 04/04/2015  . Eucalyptus oil Swelling 11/28/2017  . Keflex [cephalexin] Other (See Comments) 08/10/2019    ROS:  General: Negative for anorexia, weight loss, fever, chills, fatigue, weakness. ENT: Negative for hoarseness, difficulty swallowing , nasal congestion. CV: Negative for chest pain, angina, palpitations, dyspnea on exertion, peripheral edema.  Respiratory: Negative for dyspnea at rest, dyspnea on exertion, cough, sputum, wheezing.  GI: See history of present illness. GU:  Negative for dysuria, hematuria, urinary incontinence, urinary frequency, nocturnal urination.  Endo: Negative for unusual weight change.    Physical Examination:   BP 115/78   Pulse 76   Temp 98.1 F (36.7 C) (Temporal)   Ht 5' (1.524 m)   Wt 209 lb 12.8 oz (95.2 kg)   BMI 40.97 kg/m   General: Well-nourished, well-developed in no acute distress.  Eyes: No icterus. Conjunctivae pink. Lungs: Clear to  auscultation bilaterally. Non-labored. Heart: Regular rate and rhythm, no murmurs rubs or gallops.  Abdomen: Bowel sounds are normal, Positive tenderness in the left lower quadrant without rebound, nondistended, no hepatosplenomegaly or masses, no abdominal bruits or hernia , no rebound or guarding.   Extremities: No lower extremity edema. No clubbing or deformities. Neuro: Alert and oriented x 3.  Grossly intact. Skin: Warm and dry, no jaundice.   Psych: Alert and cooperative, normal mood and affect.  Labs:    Imaging Studies: No results found.  Assessment and Plan:    Deborah Brandt is a 55 y.o. y/o female who comes in today with a history of recurrent diverticulitis and significant tenderness in the left lower quadrant. Although a repeat colonoscopy would be ideal it appears this patient has ongoing left lower quadrant pain and therefore will hold off on any procedures at this time.  She also reports that she had a flare of diverticulitis last week.  The patient will be set up for a CT scan of the abdomen to see if she is having diverticulosis the present time.  It will also tell us if she is having any complications of diverticulitis.  She will also have a consultative for a surgical evaluation for possible resection of her colon where the diverticulitis has recurred.  The patient has been explained the plan and agrees with it.     Lucilla Lame, MD. Marval Regal    Note: This dictation was prepared with Dragon dictation along with smaller phrase technology. Any transcriptional errors that result from this process are unintentional.

## 2020-12-16 ENCOUNTER — Other Ambulatory Visit: Payer: Self-pay

## 2020-12-17 ENCOUNTER — Ambulatory Visit: Payer: Managed Care, Other (non HMO) | Admitting: Surgery

## 2020-12-17 ENCOUNTER — Encounter: Payer: Self-pay | Admitting: Surgery

## 2020-12-17 ENCOUNTER — Other Ambulatory Visit: Payer: Self-pay

## 2020-12-17 ENCOUNTER — Telehealth: Payer: Self-pay | Admitting: Surgery

## 2020-12-17 VITALS — BP 106/74 | HR 85 | Temp 98.6°F | Ht 60.0 in | Wt 210.0 lb

## 2020-12-17 DIAGNOSIS — K5732 Diverticulitis of large intestine without perforation or abscess without bleeding: Secondary | ICD-10-CM

## 2020-12-17 DIAGNOSIS — T8859XD Other complications of anesthesia, subsequent encounter: Secondary | ICD-10-CM | POA: Diagnosis not present

## 2020-12-17 DIAGNOSIS — T8859XA Other complications of anesthesia, initial encounter: Secondary | ICD-10-CM

## 2020-12-17 MED ORDER — NEOMYCIN SULFATE 500 MG PO TABS
1000.0000 mg | ORAL_TABLET | Freq: Three times a day (TID) | ORAL | 0 refills | Status: DC
Start: 2020-12-17 — End: 2020-12-25

## 2020-12-17 MED ORDER — METRONIDAZOLE 500 MG PO TABS
ORAL_TABLET | ORAL | 0 refills | Status: DC
Start: 2020-12-17 — End: 2020-12-25

## 2020-12-17 MED ORDER — TRIAMCINOLONE ACETONIDE 0.5 % EX OINT
1.0000 | TOPICAL_OINTMENT | Freq: Two times a day (BID) | CUTANEOUS | 0 refills | Status: DC
Start: 2020-12-17 — End: 2021-01-12

## 2020-12-17 MED ORDER — POLYETHYLENE GLYCOL 3350 17 GM/SCOOP PO POWD
ORAL | 0 refills | Status: DC
Start: 2020-12-17 — End: 2020-12-25

## 2020-12-17 NOTE — H&P (View-Only) (Signed)
Patient ID: Deborah Brandt, female   DOB: 07/27/1966, 54 y.o.   MRN: [id number]  HPI Deborah Brandt is a 54 y.o. female seen in consultation at the request of Dr.Wohl.  Comes in with recurrent episode of diverticulitis.  She states that she has had at least 20 episodes in the past for the last 20 years.  She has had 5 episodes within the last 4 months.  Usually she takes antibiotics but she has developed significant allergies.  She was recently placed on Augmentin and had a rash .  She continues to have intermittent pain but actually feels a little bit better as compared to couple weeks ago.  She describes the pain is intermittent, moderate and sharp, no other specific alleviating or aggravating factors.  She did have a diagnostic laparoscopy more than 30 years ago for endometriosis and apparently she coded/flat lined.  According to the patient. Currently she is able to perform more than 4 METS of activity without any shortness of breath or chest pain.  Last week she did have CT scan of the abdomen pelvis due to recurrent abdominal pain have personally reviewed the scan showing evidence of diverticulitis without abscess.  He has had at least 4 CT scan prior to that and they will show area consistent with diverticulitis within the distal sigmoid colon.  Of note she also had a colonoscopy about 3 years ago by Dr. Wohl showing diverticulosis without evidence of suspicious lesions.  Small hyperplastic polyp. CMP 8 month ago was normal.  HPI  Past Medical History:  Diagnosis Date  . Complication of anesthesia    after laparoscopy(1988) BP dropped.  Low BP after epidurals for childbirth  . Diverticulitis   . Dysplastic nevus 02/21/2017   R lat pubic groin - mod  . Dysplastic nevus 02/21/2017   L sup buttocks crease - mild   . Dysplastic nevus 04/04/2017   RUQA - mod to severe 05/03/2017 excision  . Dysplastic nevus 04/04/2017   R prox ant lat thigh - mild  . Dysplastic nevus 07/18/2017   L side inf costal  margin - mild   . Dysplastic nevus 10/26/2017   R lat prox thigh near buttocks - mod  . Dysplastic nevus 04/27/2018   L mid ant lat thigh - mod  . Dysplastic nevus 07/26/2018   R ant med prox mid thigh - mod  . Heart murmur    at birth. no recent mention  . HLD (hyperlipidemia)   . Melanoma ([facility]) 02/22/2017   R post prox lat thigh - Early invasive, Breslow's 0.32mm, Clark level 2 - excision  . Melanoma in situ ([facility]) 02/07/2018   L distal tricep - MMIS arising in a dysplastic nevus - excision  . Migraine   . Motion sickness    boats, back seat of car  . Renal stones     Past Surgical History:  Procedure Laterality Date  . BREAST BIOPSY Left 08/22/2017   neg, PREDOMINANTLY FATTY BREAST TISSUE  . COLONOSCOPY WITH PROPOFOL N/A 12/02/2017   Procedure: COLONOSCOPY WITH PROPOFOL;  Surgeon: Wohl, Darren, MD;  Location: [facility];  Service: Endoscopy;  Laterality: N/A;  . DIAGNOSTIC LAPAROSCOPY  1987  . POLYPECTOMY  12/02/2017   Procedure: POLYPECTOMY;  Surgeon: Wohl, Darren, MD;  Location: [facility];  Service: Endoscopy;;  . SINUS EXPLORATION      Family History  Problem Relation Age of Onset  . Breast cancer Mother 72  . Diabetes Father   . Colon cancer Paternal   Grandmother        dx'd at age 94  . Diabetes Paternal Grandmother     Social History Social History   Tobacco Use  . Smoking status: Never Smoker  . Smokeless tobacco: Never Used  Vaping Use  . Vaping Use: Never used  Substance Use Topics  . Alcohol use: Yes    Comment: rare  . Drug use: No    Allergies  Allergen Reactions  . Ciprofloxacin Hives and Nausea And Vomiting    During IV cipro at ER  . Levaquin [Levofloxacin] Hives  . Sulfa Antibiotics Hives  . Augmentin [Amoxicillin-Pot Clavulanate] Hives and Itching  . Codeine Nausea And Vomiting  . Doxycycline Hives  . Eucalyptus Oil Swelling    Eyes swell shut  . Keflex [Cephalexin] Other (See Comments)    Swelling of face, burning  sensation in face, face is red,swollen, skin is cracking on cheeks,nose and chin and bleeding.     Current Outpatient Medications  Medication Sig Dispense Refill  . acetaminophen (TYLENOL) 325 MG tablet Take 650 mg by mouth every 6 (six) hours as needed.    . chlorpheniramine (CHLOR-TRIMETON) 4 MG tablet Take 4 mg by mouth 2 (two) times daily as needed for allergies.    . metroNIDAZOLE (FLAGYL) 500 MG tablet Take 2 tabs (1000 mg) 3 times daily. 6 tablet 0  . neomycin (MYCIFRADIN) 500 MG tablet Take 2 tablets (1,000 mg total) by mouth 3 (three) times daily. 6 tablet 0  . polyethylene glycol powder (MIRALAX) 17 GM/SCOOP powder Mix with 64 ounces of Gatorade (no red) the day prior to surgery and drink as directed. 255 g 0  . triamcinolone ointment (KENALOG) 0.5 % Apply 1 application topically 2 (two) times daily. 30 g 0   No current facility-administered medications for this visit.     Review of Systems Full ROS  was asked and was negative except for the information on the HPI  Physical Exam Blood pressure 106/74, pulse 85, temperature 98.6 F (37 C), height 5' (1.524 m), weight 95.3 kg, SpO2 98 %. CONSTITUTIONAL: NAD, BMI 41 EYES: Pupils are equal, round, Sclera are non-icteric. EARS, NOSE, MOUTH AND THROAT: The oropharynx is clear.  She is wearing a mask LYMPH NODES:  Lymph nodes in the neck are normal. RESPIRATORY:  Lungs are clear. There is normal respiratory effort, with equal breath sounds bilaterally, and without pathologic use of accessory muscles. CARDIOVASCULAR: Heart is regular without murmurs, gallops, or rubs. GI: The abdomen is soft, mild tenderness palpation of the left lower quadrant without peritonitis , and nondistended. There are no palpable masses. There is no hepatosplenomegaly. There are normal bowel sounds in all quadrants. GU: Rectal deferred.   MUSCULOSKELETAL: Normal muscle strength and tone. No cyanosis or edema.   SKIN: Turgor is good and there are no pathologic  skin lesions or ulcers. NEUROLOGIC: Motor and sensation is grossly normal. Cranial nerves are grossly intact. PSYCH:  Oriented to person, place and time. Affect is normal.  Data Reviewed  I have personally reviewed the patient's imaging, laboratory findings and medical records.    Assessment/Plan 54-year-old chronic diverticulitis and recurrent episode of diverticulitis.  It is recalcitrant.  She has had at least 20 episodes of diverticulitis.  She has had multiple allergies to different antibiotics and is unable to tolerate them well. sHe has required 1 prior hospitalization.  I had an extensive discussion with patient regarding her disease process.  She is tired of her diverticulitis and the impact that   this has brought on her quality of life.  She is very interested in pursuing sigmoid colectomy.  I do think that this is reasonable given her multiple recurrent episodes and the inability to take antibiotics due to multiple allergies.  I had an extensive discussion with her regarding the procedure.  Risks, benefits and possible medications including but not limited to: Bleeding, flexion and anastomotic leak, need for ostomy, prolonged hospitalization, ileus and injuries to adjacent structures.  She understands and wishes to proceed.  We will arrange for anesthesia preoperative consult given adverse effect that she had on prior procedures.  I will also obtain recent labs to include a CBC CMP, INR as well as an EKG and a chest x-ray.  Extensive counseling provided.  I do think that proceeding with sigmoid colectomy will be in her best interest.  I do not certainly think that a colonoscopy will add any additional information as she had colonoscopy 3 years ago that was pretty much normal and there is no evidence of any other changes on the CT scan that merit repeat endoscopic evaluation. Time spent with in this encounter  was 23 minutes,including, reviewing records, coordinating her care and  face-to-face  education, counseling and care coordination.   A copy of this report was sent to the referring provider  Caroleen Hamman, MD FACS General Surgeon 12/17/2020, 1:53 PM

## 2020-12-17 NOTE — Consult Note (Signed)
Patient ID: Deborah Brandt, female   DOB: 12-31-1965, 55 y.o.   MRN: [id number]  HPI Deborah Brandt is a 55 y.o. female seen in consultation at the request of Dr.Wohl.  Comes in with recurrent episode of diverticulitis.  She states that she has had at least 20 episodes in the past for the last 20 years.  She has had 5 episodes within the last 4 months.  Usually she takes antibiotics but she has developed significant allergies.  She was recently placed on Augmentin and had a rash .  She continues to have intermittent pain but actually feels a little bit better as compared to couple weeks ago.  She describes the pain is intermittent, moderate and sharp, no other specific alleviating or aggravating factors.  She did have a diagnostic laparoscopy more than 30 years ago for endometriosis and apparently she coded/flat lined.  According to the patient. Currently she is able to perform more than 4 METS of activity without any shortness of breath or chest pain.  Last week she did have CT scan of the abdomen pelvis due to recurrent abdominal pain have personally reviewed the scan showing evidence of diverticulitis without abscess.  He has had at least 4 CT scan prior to that and they will show area consistent with diverticulitis within the distal sigmoid colon.  Of note she also had a colonoscopy about 3 years ago by Dr. Allen Norris showing diverticulosis without evidence of suspicious lesions.  Small hyperplastic polyp. CMP 8 month ago was normal.  HPI  Past Medical History:  Diagnosis Date  . Complication of anesthesia    after laparoscopy(1988) BP dropped.  Low BP after epidurals for childbirth  . Diverticulitis   . Dysplastic nevus 02/21/2017   R lat pubic groin - mod  . Dysplastic nevus 02/21/2017   L sup buttocks crease - mild   . Dysplastic nevus 04/04/2017   RUQA - mod to severe 05/03/2017 excision  . Dysplastic nevus 04/04/2017   R prox ant lat thigh - mild  . Dysplastic nevus 07/18/2017   L side inf costal  margin - mild   . Dysplastic nevus 10/26/2017   R lat prox thigh near buttocks - mod  . Dysplastic nevus 04/27/2018   L mid ant lat thigh - mod  . Dysplastic nevus 07/26/2018   R ant med prox mid thigh - mod  . Heart murmur    at birth. no recent mention  . HLD (hyperlipidemia)   . Melanoma ([place]) 02/22/2017   R post prox lat thigh - Early invasive, Breslow's 0.39mm, Clark level 2 - excision  . Melanoma in situ ([place]) 02/07/2018   L distal tricep - MMIS arising in a dysplastic nevus - excision  . Migraine   . Motion sickness    boats, back seat of car  . Renal stones     Past Surgical History:  Procedure Laterality Date  . BREAST BIOPSY Left 08/22/2017   neg, PREDOMINANTLY FATTY BREAST TISSUE  . COLONOSCOPY WITH PROPOFOL N/A 12/02/2017   Procedure: COLONOSCOPY WITH PROPOFOL;  Surgeon: Lucilla Lame, MD;  Location: [place];  Service: Endoscopy;  Laterality: N/A;  . DIAGNOSTIC LAPAROSCOPY  1987  . POLYPECTOMY  12/02/2017   Procedure: POLYPECTOMY;  Surgeon: Lucilla Lame, MD;  Location: [place];  Service: Endoscopy;;  . SINUS EXPLORATION      Family History  Problem Relation Age of Onset  . Breast cancer Mother 21  . Diabetes Father   . Colon cancer Paternal  Grandmother        dx'd at age 48  . Diabetes Paternal Grandmother     Social History Social History   Tobacco Use  . Smoking status: Never Smoker  . Smokeless tobacco: Never Used  Vaping Use  . Vaping Use: Never used  Substance Use Topics  . Alcohol use: Yes    Comment: rare  . Drug use: No    Allergies  Allergen Reactions  . Ciprofloxacin Hives and Nausea And Vomiting    During IV cipro at ER  . Levaquin [Levofloxacin] Hives  . Sulfa Antibiotics Hives  . Augmentin [Amoxicillin-Pot Clavulanate] Hives and Itching  . Codeine Nausea And Vomiting  . Doxycycline Hives  . Eucalyptus Oil Swelling    Eyes swell shut  . Keflex [Cephalexin] Other (See Comments)    Swelling of face, burning  sensation in face, face is red,swollen, skin is cracking on cheeks,nose and chin and bleeding.     Current Outpatient Medications  Medication Sig Dispense Refill  . acetaminophen (TYLENOL) 325 MG tablet Take 650 mg by mouth every 6 (six) hours as needed.    . chlorpheniramine (CHLOR-TRIMETON) 4 MG tablet Take 4 mg by mouth 2 (two) times daily as needed for allergies.    . metroNIDAZOLE (FLAGYL) 500 MG tablet Take 2 tabs (1000 mg) 3 times daily. 6 tablet 0  . neomycin (MYCIFRADIN) 500 MG tablet Take 2 tablets (1,000 mg total) by mouth 3 (three) times daily. 6 tablet 0  . polyethylene glycol powder (MIRALAX) 17 GM/SCOOP powder Mix with 64 ounces of Gatorade (no red) the day prior to surgery and drink as directed. 255 g 0  . triamcinolone ointment (KENALOG) 0.5 % Apply 1 application topically 2 (two) times daily. 30 g 0   No current facility-administered medications for this visit.     Review of Systems Full ROS  was asked and was negative except for the information on the HPI  Physical Exam Blood pressure 106/74, pulse 85, temperature 98.6 F (37 C), height 5' (1.524 m), weight 95.3 kg, SpO2 98 %. CONSTITUTIONAL: NAD, BMI 41 EYES: Pupils are equal, round, Sclera are non-icteric. EARS, NOSE, MOUTH AND THROAT: The oropharynx is clear.  She is wearing a mask LYMPH NODES:  Lymph nodes in the neck are normal. RESPIRATORY:  Lungs are clear. There is normal respiratory effort, with equal breath sounds bilaterally, and without pathologic use of accessory muscles. CARDIOVASCULAR: Heart is regular without murmurs, gallops, or rubs. GI: The abdomen is soft, mild tenderness palpation of the left lower quadrant without peritonitis , and nondistended. There are no palpable masses. There is no hepatosplenomegaly. There are normal bowel sounds in all quadrants. GU: Rectal deferred.   MUSCULOSKELETAL: Normal muscle strength and tone. No cyanosis or edema.   SKIN: Turgor is good and there are no pathologic  skin lesions or ulcers. NEUROLOGIC: Motor and sensation is grossly normal. Cranial nerves are grossly intact. PSYCH:  Oriented to person, place and time. Affect is normal.  Data Reviewed  I have personally reviewed the patient's imaging, laboratory findings and medical records.    Assessment/Plan 55 year old chronic diverticulitis and recurrent episode of diverticulitis.  It is recalcitrant.  She has had at least 20 episodes of diverticulitis.  She has had multiple allergies to different antibiotics and is unable to tolerate them well. sHe has required 1 prior hospitalization.  I had an extensive discussion with patient regarding her disease process.  She is tired of her diverticulitis and the impact that  this has brought on her quality of life.  She is very interested in pursuing sigmoid colectomy.  I do think that this is reasonable given her multiple recurrent episodes and the inability to take antibiotics due to multiple allergies.  I had an extensive discussion with her regarding the procedure.  Risks, benefits and possible medications including but not limited to: Bleeding, flexion and anastomotic leak, need for ostomy, prolonged hospitalization, ileus and injuries to adjacent structures.  She understands and wishes to proceed.  We will arrange for anesthesia preoperative consult given adverse effect that she had on prior procedures.  I will also obtain recent labs to include a CBC CMP, INR as well as an EKG and a chest x-ray.  Extensive counseling provided.  I do think that proceeding with sigmoid colectomy will be in her best interest.  I do not certainly think that a colonoscopy will add any additional information as she had colonoscopy 3 years ago that was pretty much normal and there is no evidence of any other changes on the CT scan that merit repeat endoscopic evaluation. Time spent with in this encounter  was 23 minutes,including, reviewing records, coordinating her care and  face-to-face  education, counseling and care coordination.   A copy of this report was sent to the referring provider  Caroleen Hamman, MD FACS General Surgeon 12/17/2020, 1:53 PM

## 2020-12-17 NOTE — Patient Instructions (Addendum)
Our surgery scheduler Pamala Hurry will call you within 24-48 hours to get you scheduled. If you have not heard from her after 48 hours, please call our office. You will not need to get Covid tested before surgery and have the blue sheet available when she calls to write down important information.   Please get your xray and labs done at the [facility] in [facility] along with EKG on 12/19/20 @ 8:45 am. Please pick up your prescriptions at your pharmacy.   If you have any concerns or questions, please feel free to call our office.    Diverticulitis  Diverticulitis is when small pouches in your colon (large intestine) get infected or swollen. This causes pain in the belly (abdomen) and watery poop (diarrhea). These pouches are called diverticula. The pouches form in people who have a condition called diverticulosis. What are the causes? This condition may be caused by poop (stool) that gets trapped in the pouches in your colon. The poop lets germs (bacteria) grow in the pouches. This causes the infection. What increases the risk? You are more likely to get this condition if you have small pouches in your colon. The risk is higher if:  You are overweight or very overweight (obese).  You do not exercise enough.  You drink alcohol.  You smoke or use products with tobacco in them.  You eat a diet that has a lot of red meat such as beef, pork, or lamb.  You eat a diet that does not have enough fiber in it.  You are older than 56 years of age. What are the signs or symptoms?  Pain in the belly. Pain is often on the left side, but it may be in other areas.  Fever and feeling cold.  Feeling like you may vomit.  Vomiting.  Having cramps.  Feeling full.  Changes to how often you poop.  Blood in your poop. How is this treated? Most cases are treated at home by:  Taking over-the-counter pain medicines.  Following a clear liquid diet.  Taking antibiotic medicines.  Resting. Very  bad cases may need to be treated at a hospital. This may include:  Not eating or drinking.  Taking prescription pain medicine.  Getting antibiotic medicines through an IV tube.  Getting fluid and food through an IV tube.  Having surgery. When you are feeling better, your doctor may tell you to have a test to check your colon (colonoscopy). Follow these instructions at home: Medicines  Take over-the-counter and prescription medicines only as told by your doctor. These include: ? Antibiotics. ? Pain medicines. ? Fiber pills. ? Probiotics. ? Stool softeners.  If you were prescribed an antibiotic medicine, take it as told by your doctor. Do not stop taking the antibiotic even if you start to feel better.  Ask your doctor if the medicine prescribed to you requires you to avoid driving or using machinery. Eating and drinking  Follow a diet as told by your doctor.  When you feel better, your doctor may tell you to change your diet. You may need to eat a lot of fiber. Fiber makes it easier to poop (have a bowel movement). Foods with fiber include: ? Berries. ? Beans. ? Lentils. ? Green vegetables.  Avoid eating red meat.   General instructions  Do not use any products that contain nicotine or tobacco, such as cigarettes, e-cigarettes, and chewing tobacco. If you need help quitting, ask your doctor.  Exercise 3 or more times a week.  Try to get 30 minutes each time. Exercise enough to sweat and make your heart beat faster.  Keep all follow-up visits as told by your doctor. This is important. Contact a doctor if:  Your pain does not get better.  You are not pooping like normal. Get help right away if:  Your pain gets worse.  Your symptoms do not get better.  Your symptoms get worse very fast.  You have a fever.  You vomit more than one time.  You have poop that is: ? Bloody. ? Black. ? Tarry. Summary  This condition happens when small pouches in your colon get  infected or swollen.  Take medicines only as told by your doctor.  Follow a diet as told by your doctor.  Keep all follow-up visits as told by your doctor. This is important. This information is not intended to replace advice given to you by your health care provider. Make sure you discuss any questions you have with your health care provider. Document Revised: 05/21/2019 Document Reviewed: 05/21/2019 Elsevier Patient Education  2021 [place] PREP Please follow the instructions carefully. It is important to clean out your bowels & take the prescribed antibiotic pills to lower your chances of a wound infection or abscess.   FIVE DAYS PRIOR TO YOUR SURGERY Stop eating any nuts, popcorn, or fruit with seeds. Stop all fiber supplements such as Metamucil, Citrucel, etc.   Hold taking any blood thinning anticoagulation medication (ex: aspirin, warfarin/Coumadin, Plavix, Xarelto, Eliquis, Pradaxa, etc) as recommended by your medical/cardiology doctor  Obtain what you need at a pharmacy of your choice: -Filled out prescriptions for your oral antibiotics (Neomycin & Metronidazole)  -A bottle of MiraLax / Glycolax (288g) - no prescription required  -A large bottle of Gatorade / Powerade (64oz)  -Dulcolax tablets (4 tabs) - no prescription required   DAY PRIOR TO SURGERY   7:00am Swallow 4 Dulcolax tablets with some water Drink plenty of clear liquids all day to avoid getting dehydrated (Water, juice, soda, coffee, tea, bouillon, jello, etc.)  10:00am Mix the bottle of MiraLax with the 64-oz bottle of Gatorade.  Drink the Gatorade mixture gradually over the next few hours (8oz glass every 15-30 minutes) until gone. You should finish by 2pm.  2:00pm Take 2 Neomycin 500mg  tablets & 2 Metronidazole 500mg  tablets  3:00pm Take 2 Neomycin 500mg  tablets & 2 Metronidazole 500mg  tablets  Drink plenty of clear liquids all evening to avoid getting dehydrated  10:00pm Take 2  Neomycin 500mg  tablets & 2 Metronidazole 500mg  tablets  Do not eat or drink anything after bedtime (midnight) the night before your surgery.   MORNING OF SURGERY Remember to not to drink or eat anything that morning  Hold or take medications as recommended by the hospital staff at your Preoperative visit

## 2020-12-17 NOTE — Consult Note (Signed)
Perioperative Services  Pre-Admission/Anesthesia Testing Clinical Consult  Date: 12/22/20  Patient Demographics:  Name: Deborah Brandt DOB:   June 16, 1966 MRN:   [id number]  Planned Surgical Procedure(s):    Case: [id number] Date/Time: 12/23/20 1050   Procedure: XI ROBOT ASSISTED SIGMOID COLECTOMY, possible ostomy, RNFA to assist (N/A )   Anesthesia type: General   Pre-op diagnosis: diverticulitis   Location: [facility] OR ROOM 04 / [place] ORS FOR ANESTHESIA GROUP   Surgeons: Jules Husbands, MD    NOTE: Available PAT nursing documentation and vital signs have been reviewed. Clinical nursing staff has updated patient's PMH/PSHx, current medication list, and drug allergies/intolerances to ensure comprehensive history available to assist in medical decision making as it pertains to the aforementioned surgical procedure and anticipated anesthetic course.   Clinical Discussion:  Deborah Brandt is a 55 y.o. female who is submitted for pre-surgical anesthesia review and clearance prior to her undergoing the above procedure. Patient has never been a smoker. Pertinent PMH includes: childhood cardiac murmur, HLD, diverticulitis, hepatic steatosis, obesity (BMI 41.01 kg/m).   Patient with a PMH (+) for diverticulosis with recurrent diverticulitis.  Patient reporting in excess of 20 episodes of the course of the last 20 years, with 5 of those episodes being within the last 4 months.  Patient is under the care of local gastroenterology Allen Norris, MD) whom she last saw on 12/10/2020; notes reviewed.  Last colonoscopy was performed in 11/2017 revealing a single 3 mm rectal polyp, sigmoid diverticulosis, and non-bleeding internal hemorrhoids.  Subsequent tissue biopsy was negative for dysplasia or malignancy.  Recent contrast CT imaging of the abdomen pelvis performed on 12/10/2020 revealed acute uncomplicated distal sigmoid diverticulitis with no evidence of perforation or abscess formation.  Incidental findings included  non-obstructive nephrolithiasis and diffuse hepatic steatosis.  Patient generally treated with antimicrobial courses, however has developed significant allergies over the years.  She denied any unexplained weight loss, melena, hematochezia.  Typical symptom constellation consists of LLQ pain, fevers, and chills. Patient expressed desire for definitive treatment to aide in the prevention of future flares. Patient was referred to general surgery for further evaluation.  Patient was seen in consult on 12/17/2020 by general surgery Dahlia Byes, MD) for evaluation and consideration of surgical intervention; notes reviewed.  General surgeon reviewed case and noted one previous hospitalization in the past due to a flare of her diverticulitis.  MD discussed disease process, progression, and the effects that the condition can have on her quality of life.  Given patient's multiple recurrent flares, coupled with her progressive inability to tolerate oral antimicrobial therapies used to treat this condition, recommendations were for sigmoid colectomy with the potential for ostomy creation.  Preoperative labs, CXR, and ECG ordered and reviewed by surgical and anesthetic team.   During her time spent with general surgeon, patient made mention of (+) previous anesthesia complications, thus I was asked to meet with patient formally to discuss. In speaking with patient, she reports that she "flat lined" in the past during procedures in the past. Patient advising that the first experience was during an exploratory laparoscopy in 1988 at [facility] in [place], [place]. This reportedly occurred a second time in 1990 during a vaginal delivery at [facility] in [place], [place]. Details surrounding these events, as able to be provided by the patient, were limited. Medical records from both of the aforementioned facilities were requested for review, however both facilities returned notification that patient was not  found in their system (searched all possible surnames: Cieslewicz,  [place], and [place]). PAT APP met with patient on the morning on 12/19/2020 to discuss anesthesia concerns. Patient is unsure if she lost pulses or became apneic. Of note, patient does make mention of a PMH (+) for hypotension.  Drawing from the history that she provided, it seems as if patient became significantly hypotensive during the aforementioned episodes, which in turn required additional supportive care measures. During our time together, it was determined that patient has undergone general anesthesia since the events in question. Patient underwent sinus exploration surgery in 1998 and did well; reports an uncomplicated procedural/anesthetic course. She underwent routine uncomplicated colonoscopies in 2004 and 2019. Reassurance was provided regarding her upcoming procedure. Patient was advised that she will be able to further discuss her concerns with anesthetic team prior to her undergoing any type of anesthesia prior to her procedure.  Patient is scheduled for an robot-assisted sigmoid colectomy with possible ostomy creation on 12/23/2020 with Dr. Caroleen Hamman. She has no significant past medical history that would require clearances from any specialty providers. Patient denies a history of any known cardiopulmonary diagnoses. Functional capacity, as defined by DASI, is documented as being >/= 4 METS.  We did reach out to her PCP for medical input, however due to the limited time between procedure posting, request for clearance, and actual procedure, there was simply not time for a formal preoperative evaluation in clinic. Patient was last seen by her PCP, via virtual visit, on 11/24/2020. Per internal/family medicine Damita Dunnings, MD), "to my knowledge, the patient is still at an appropriately LOW risk". This patient is not currently taking any type of daily anticoagulant/antiplatelet medications.   Patient denies any known previous  complications with intubation. In review of the available records, it is noted that patient underwent a general anesthetic course at [facility] in 11/2017 without documented complications. She has never had any significant injuries or surgeries to her cervical spine; FROM noted on exam. Airway assessment revealed a TMD of >3 FB and a Mallampati airway classification score of II.     Vitals with BMI 12/18/2020 12/17/2020 12/10/2020  Height 5' 0"  5' 0"  5' 0"   Weight 210 lbs 210 lbs 209 lbs 13 oz  BMI 41.01 24.26 83.41  Systolic - 962 229  Diastolic - 74 78  Pulse - 85 76    Providers/Specialists:   NOTE: Primary physician provider listed below. Patient may have been seen by APP or partner within same practice.   PROVIDER ROLE / SPECIALTY LAST Suszanne Finch, MD  General Surgery  12/17/2020  Tonia Ghent, MD  Primary Care Provider  11/24/2020  Lucilla Lame, MD  Gastroenterology  12/10/2020   Allergies:  Ciprofloxacin, Levaquin [levofloxacin], Sulfa antibiotics, Augmentin [amoxicillin-pot clavulanate], Codeine, Doxycycline, Eucalyptus oil, and Keflex [cephalexin]  Current Home Medications:   No current facility-administered medications for this encounter.   Marland Kitchen acetaminophen (TYLENOL) 500 MG tablet  . chlorpheniramine (CHLOR-TRIMETON) 4 MG tablet  . melatonin 5 MG TABS  . diphenhydrAMINE (BENADRYL) 25 MG tablet  . metroNIDAZOLE (FLAGYL) 500 MG tablet  . neomycin (MYCIFRADIN) 500 MG tablet  . polyethylene glycol powder (MIRALAX) 17 GM/SCOOP powder  . triamcinolone ointment (KENALOG) 0.5 %   History:   Past Medical History:  Diagnosis Date  . Complication of anesthesia    after laparoscopy(1988) BP dropped.  Low BP after epidurals for childbirth  . Diverticulitis   . Dysplastic nevus 02/21/2017   R lat pubic groin - mod  . Dysplastic nevus  02/21/2017   L sup buttocks crease - mild   . Dysplastic nevus 04/04/2017   RUQA - mod to severe 05/03/2017  excision  . Dysplastic nevus 04/04/2017   R prox ant lat thigh - mild  . Dysplastic nevus 07/18/2017   L side inf costal margin - mild   . Dysplastic nevus 10/26/2017   R lat prox thigh near buttocks - mod  . Dysplastic nevus 04/27/2018   L mid ant lat thigh - mod  . Dysplastic nevus 07/26/2018   R ant med prox mid thigh - mod  . Gestational diabetes 1990, 2001  . Heart murmur    at birth. no recent mention  . Hepatic steatosis   . History of kidney stones   . HLD (hyperlipidemia)   . Melanoma ([place]) 02/22/2017   R post prox lat thigh - Early invasive, Breslow's 0.70m, Clark level 2 - excision  . Melanoma in situ ([place] 02/07/2018   L distal tricep - MMIS arising in a dysplastic nevus - excision  . Migraine   . Motion sickness    boats, back seat of car  . Nephrolithiasis   . Pneumonia   . Sensorineural hearing loss (SNHL) of left ear    Past Surgical History:  Procedure Laterality Date  . BREAST BIOPSY Left 08/22/2017   neg, PREDOMINANTLY FATTY BREAST TISSUE  . COLONOSCOPY  2004  . COLONOSCOPY WITH PROPOFOL N/A 12/02/2017   Procedure: COLONOSCOPY WITH PROPOFOL;  Surgeon: WLucilla Lame MD;  [place] [facility]  Service: Endoscopy;  Laterality: N/A;  . DIAGNOSTIC LAPAROSCOPY  1988   checking for endometriosis but negative [place] . POLYPECTOMY  12/02/2017   Procedure: POLYPECTOMY;  Surgeon: WLucilla Lame MD;  [facility]  Service: Endoscopy;;  . [place]  . TUBAL LIGATION    . [facility] [place]  Family History  Problem Relation Age of Onset  . Breast cancer Mother 781 . Stroke Mother   . Diabetes Father   . Heart disease Father   . Colon cancer Paternal Grandmother        dx'd at age 56 . Diabetes Paternal Grandmother    Social History    Tobacco Use  . Smoking status: Never Smoker  . Smokeless tobacco: Never Used  Vaping Use  . Vaping Use: Never used  Substance Use Topics  . Alcohol use: Yes    Comment: rarely  . Drug use: No    Pertinent Clinical Results:  LABS: Labs reviewed: Acceptable for surgery.  No visits with results within 3 Day(s) from this visit.  Latest known visit with results is:  Hospital Outpatient Visit on 12/19/2020  Component Date Value Ref Range Status  . SARS Coronavirus 2 12/19/2020 NEGATIVE  NEGATIVE Final   Comment: (NOTE) SARS-CoV-2 target nucleic acids are NOT DETECTED.  The SARS-CoV-2 RNA is generally detectable in upper and lower respiratory specimens during the acute phase of infection. Negative results do not preclude SARS-CoV-2 infection, do not rule out co-infections with other pathogens, and should not be used as the sole basis for treatment or other patient management decisions. Negative results must be combined with clinical observations, patient history, and epidemiological information. The expected result is Negative.  Fact Sheet for Patients: hSugarRoll.be Fact Sheet for Healthcare Providers:  https://www.woods-mathews.com/  This test is not yet approved or cleared by the [place] and  has been authorized for detection and/or diagnosis of SARS-CoV-2 by FDA under an Emergency Use Authorization (EUA). This EUA will remain  in effect (meaning this test can be used) for the duration of the COVID-19 declaration under Se                          ction 564(b)(1) of the Act, 21 U.S.C. section 360bbb-3(b)(1), unless the authorization is terminated or revoked sooner.  Performed at [facility], [place] [address] [place], [place] [address]   . WBC 12/19/2020 5.7  4.0 - 10.5 K/uL Final  . RBC 12/19/2020 4.46  3.87 - 5.11 MIL/uL Final  . Hemoglobin 12/19/2020 13.3  12.0 - 15.0 g/dL Final  . HCT 12/19/2020 40.0  36.0 -  46.0 % Final  . MCV 12/19/2020 89.7  80.0 - 100.0 fL Final  . MCH 12/19/2020 29.8  26.0 - 34.0 pg Final  . MCHC 12/19/2020 33.3  30.0 - 36.0 g/dL Final  . RDW 12/19/2020 12.9  11.5 - 15.5 % Final  . Platelets 12/19/2020 233  150 - 400 K/uL Final  . nRBC 12/19/2020 0.0  0.0 - 0.2 % Final   Performed at [facility], [address], [place], [place] [address]  . Sodium 12/19/2020 139  135 - 145 mmol/L Final  . Potassium 12/19/2020 3.8  3.5 - 5.1 mmol/L Final  . Chloride 12/19/2020 105  98 - 111 mmol/L Final  . CO2 12/19/2020 26  22 - 32 mmol/L Final  . Glucose, Bld 12/19/2020 104* 70 - 99 mg/dL Final   Glucose reference range applies only to samples taken after fasting for at least 8 hours.  . BUN 12/19/2020 9  6 - 20 mg/dL Final  . Creatinine, Ser 12/19/2020 0.79  0.44 - 1.00 mg/dL Final  . Calcium 12/19/2020 9.5  8.9 - 10.3 mg/dL Final  . Total Protein 12/19/2020 6.8  6.5 - 8.1 g/dL Final  . Albumin 12/19/2020 3.9  3.5 - 5.0 g/dL Final  . AST 12/19/2020 20  15 - 41 U/L Final  . ALT 12/19/2020 26  0 - 44 U/L Final  . Alkaline Phosphatase 12/19/2020 49  38 - 126 U/L Final  . Total Bilirubin 12/19/2020 0.6  0.3 - 1.2 mg/dL Final  . GFR, Estimated 12/19/2020 >60  >60 mL/min Final   Comment: (NOTE) Calculated using the CKD-EPI Creatinine Equation (2021)   . Anion gap 12/19/2020 8  5 - 15 Final   Performed at [facility], [address] [place], [place] [address]  . Prothrombin Time 12/19/2020 12.4  11.4 - 15.2 seconds Final  . INR 12/19/2020 0.9  0.8 - 1.2 Final   Comment: (NOTE) INR goal varies based on device and disease states. Performed at [facility], [address], [place], [place] [address]   . ABO/RH(D) 12/19/2020 A POS   Final  . Antibody Screen 12/19/2020 NEG   Final  . Sample Expiration 12/19/2020 01/02/2021,2359   Final  . Extend sample reason 12/19/2020    Final                   Value:NO TRANSFUSIONS OR PREGNANCY IN THE PAST 3  MONTHS Performed at [facility], [address] [place], [place] [address]     ECG: Date: 12/19/2020 Time ECG obtained: 1032 AM Rate: 90 bpm  Rhythm: normal sinus Axis (leads I and aVF): Normal Intervals: PR 90 ms. QRS 128 ms. QTc 486 ms. ST segment and T wave changes: No evidence of acute ST segment elevation or depression Comparison: No previous tracings available for review and comparison.    IMAGING / PROCEDURES: DIAGNOSTIC RADIOGRAPHS OF CHEST performed on 12/19/2020 1. Normal lung volumes and mediastinal contours 2. Visual tracheal air column is within normal limits 3. Both lungs appear clear aside from questionable mild decreased pulmonary interstitium. 4. No pneumothorax or pleural effusion 5. No osseous abnormality identified 6. Negative visible bowel gas pattern 7. No acute cardiopulmonary abnormalities.  CT ABDOMEN PELVIS WITH CONTRAST performed on 12/10/2020 1. Acute uncomplicated distal sigmoid diverticulitis with no evidence of perforation or abscess formation 2. Bilateral nonobstructive nephrolithiasis 3. Diffuse hepatic steatosis  Impression and Plan:  Trenia Tennyson has been referred for pre-anesthesia review and clearance prior to her undergoing the planned anesthetic and procedural courses. Available labs, pertinent testing, and imaging results were personally reviewed by me. Based on the clinical information gathered during the last visit with her, PCP has deemed that patient is at an "appropriately LOW risk" of significant perioperative complications.  Based on clinical review performed today (12/22/20), barring any significant acute changes in the patient's overall condition, it is anticipated that she will be able to proceed with the planned surgical intervention. Any acute changes in clinical condition may necessitate her procedure being postponed and/or cancelled. Patient will meet with anesthesia team (MD and/or CRNA) on the day of her procedure  for preoperative evaluation/assessment. Questions regarding anesthetic course will be fielded at that time.   Pre-surgical instructions were reviewed with the patient during her PAT appointment and questions were fielded by PAT clinical staff. Patient was advised that if any questions or concerns arise prior to her procedure then she should return a call to PAT and/or her surgeon's office to discuss.  Honor Loh, MSN, APRN, FNP-C, CEN [facility]  Peri-operative Services Nurse Practitioner Phone: [phone] 12/22/20 8:57 AM  NOTE: This note has been prepared using Dragon dictation software. Despite my best ability to proofread, there is always the potential that unintentional transcriptional errors may still occur from this process.

## 2020-12-17 NOTE — Telephone Encounter (Signed)
Patient has been advised of Pre-Admission date/time, COVID Testing date and Surgery date.  Surgery Date: 12/23/20 Preadmission Testing Date: 12/18/20 (phone 8a-1p) Covid Testing Date: 12/19/20 @ 10:00 am, patient will be having labs, EKG, chest x-ray, Covid test and site markings done.  Patient instructed to go to the [facility] ([place])   Patient has been made aware to call [phone], between 1-3:00pm the day before surgery, to find out what time to arrive for surgery.

## 2020-12-18 ENCOUNTER — Encounter: Payer: Self-pay | Admitting: Surgery

## 2020-12-18 ENCOUNTER — Encounter
Admission: RE | Admit: 2020-12-18 | Discharge: 2020-12-18 | Disposition: A | Discharge status: Home / Self Care | Payer: Managed Care, Other (non HMO) | Source: Ambulatory Visit | Attending: Surgery | Admitting: Surgery

## 2020-12-18 ENCOUNTER — Other Ambulatory Visit: Payer: Self-pay

## 2020-12-18 HISTORY — DX: Personal history of urinary calculi: Z87.442

## 2020-12-18 HISTORY — DX: Pneumonia, unspecified organism: J18.9

## 2020-12-18 NOTE — Pre-Procedure Instructions (Signed)
Pre-op interview completed. Stated that she had "flat lined" in the past with diagnostic laparoscopy in 1988 at [facility] in [place], [place] and again after vaginal childbirth in 1990 at [facility] in [place], [place]. Was unsure of any further details. A request for medical record release form was emailed to the patient for her signature in order to obtain those medical records.  Stated that she has hives all over body at the present time and is taking Benadryl prn and triamcinolone oint. Stated that it is from taking Augmentin; which she had stopped taking. Last dose was Monday, December 15, 2020.  Scheduled for labs, EKG, CXR, and site marking tomorrow.

## 2020-12-18 NOTE — Patient Instructions (Addendum)
Your procedure is scheduled on: Tuesday, May 3 Report to the Registration Desk on the 1st floor of the [facility]. To find out your arrival time, please call [phone] between 1PM - 3PM on: Monday, May 2  REMEMBER: Instructions that are not followed completely may result in serious medical risk, up to and including death; or upon the discretion of your surgeon and anesthesiologist your surgery may need to be rescheduled.  Do not eat food after midnight the night before surgery.  No gum chewing, lozengers or hard candies.  You may drink water only up until 2 hours of arriving to the hospital. Follow bowel prep and medication instructions from Dr. Dahlia Byes.  One week prior to surgery: starting today, April 28 Stop Anti-inflammatories (NSAIDS) such as Advil, Aleve, Ibuprofen, Motrin, Naproxen, Naprosyn and Aspirin based products such as Excedrin, Goodys Powder, BC Powder. Stop ANY OVER THE COUNTER supplements until after surgery.  No Alcohol for 24 hours before or after surgery.  No Smoking including e-cigarettes for 24 hours prior to surgery.  No chewable tobacco products for at least 6 hours prior to surgery.  No nicotine patches on the day of surgery.  Do not use any "recreational" drugs for at least a week prior to your surgery.  Please be advised that the combination of cocaine and anesthesia may have negative outcomes, up to and including death. If you test positive for cocaine, your surgery will be cancelled.  On the morning of surgery brush your teeth with toothpaste and water, you may rinse your mouth with mouthwash if you wish. Do not swallow any toothpaste or mouthwash.  Do not wear jewelry, make-up, hairpins, clips or nail polish.  Do not wear lotions, powders, or perfumes.   Do not shave body from the neck down 48 hours prior to surgery just in case you cut yourself which could leave a site for infection.  Also, freshly shaved skin may become irritated if using the  CHG soap.  Contact lenses, hearing aids and dentures may not be worn into surgery.  Do not bring valuables to the hospital. [facility] is not responsible for any missing/lost belongings or valuables.   Use CHG Soap or wipes as directed on instruction sheet.  Notify your doctor if there is any change in your medical condition (cold, fever, infection).  Wear comfortable clothing (specific to your surgery type) to the hospital.  After surgery, you can help prevent lung complications by doing breathing exercises.  Take deep breaths and cough every 1-2 hours. Your doctor may order a device called an Incentive Spirometer to help you take deep breaths. When coughing or sneezing, hold a pillow firmly against your incision with both hands. This is called "splinting." Doing this helps protect your incision. It also decreases belly discomfort.  If you are being admitted to the hospital overnight, leave your suitcase in the car. After surgery it may be brought to your room.  If you are being discharged the day of surgery, you will not be allowed to drive home. You will need a responsible adult (18 years or older) to drive you home and stay with you that night.   If you are taking public transportation, you will need to have a responsible adult (18 years or older) with you. Please confirm with your physician that it is acceptable to use public transportation.   Please call the [facility] Dept. at [phone] if you have any questions about these instructions.  Surgery Visitation Policy:  Patients undergoing a surgery or procedure may have one family member or support person with them as long as that person is not COVID-19 positive or experiencing its symptoms.  That person may remain in the waiting area during the procedure.  Inpatient Visitation:    Visiting hours are 7 a.m. to 8 p.m. Inpatients will be allowed two visitors daily. The visitors may change each day during the  patient's stay. No visitors under the age of 22. Any visitor under the age of 17 must be accompanied by an adult. The visitor must pass COVID-19 screenings, use hand sanitizer when entering and exiting the patient's room and wear a mask at all times, including in the patient's room. Patients must also wear a mask when staff or their visitor are in the room. Masking is required regardless of vaccination status.

## 2020-12-19 ENCOUNTER — Ambulatory Visit: Payer: Managed Care, Other (non HMO)

## 2020-12-19 ENCOUNTER — Ambulatory Visit
Admission: RE | Admit: 2020-12-19 | Discharge: 2020-12-19 | Disposition: A | Discharge status: Home / Self Care | Payer: Managed Care, Other (non HMO) | Source: Ambulatory Visit | Attending: Surgery | Admitting: Surgery

## 2020-12-19 ENCOUNTER — Inpatient Hospital Stay: Admission: RE | Admit: 2020-12-19 | Payer: Managed Care, Other (non HMO) | Source: Ambulatory Visit

## 2020-12-19 ENCOUNTER — Other Ambulatory Visit
Admission: RE | Admit: 2020-12-19 | Discharge: 2020-12-19 | Disposition: A | Discharge status: Home / Self Care | Payer: Managed Care, Other (non HMO) | Source: Ambulatory Visit | Attending: Surgery | Admitting: Surgery

## 2020-12-19 DIAGNOSIS — T8859XA Other complications of anesthesia, initial encounter: Secondary | ICD-10-CM | POA: Insufficient documentation

## 2020-12-19 DIAGNOSIS — Z01818 Encounter for other preprocedural examination: Secondary | ICD-10-CM | POA: Insufficient documentation

## 2020-12-19 DIAGNOSIS — Z20822 Contact with and (suspected) exposure to covid-19: Secondary | ICD-10-CM | POA: Insufficient documentation

## 2020-12-19 LAB — SARS CORONAVIRUS 2 (TAT 6-24 HRS): SARS Coronavirus 2: NEGATIVE

## 2020-12-19 LAB — CBC
HCT: 40 % (ref 36.0–46.0)
Hemoglobin: 13.3 g/dL (ref 12.0–15.0)
MCH: 29.8 pg (ref 26.0–34.0)
MCHC: 33.3 g/dL (ref 30.0–36.0)
MCV: 89.7 fL (ref 80.0–100.0)
Platelets: 233 10*3/uL (ref 150–400)
RBC: 4.46 MIL/uL (ref 3.87–5.11)
RDW: 12.9 % (ref 11.5–15.5)
WBC: 5.7 10*3/uL (ref 4.0–10.5)
nRBC: 0 % (ref 0.0–0.2)

## 2020-12-19 LAB — COMPREHENSIVE METABOLIC PANEL
ALT: 26 U/L (ref 0–44)
AST: 20 U/L (ref 15–41)
Albumin: 3.9 g/dL (ref 3.5–5.0)
Alkaline Phosphatase: 49 U/L (ref 38–126)
Anion gap: 8 (ref 5–15)
BUN: 9 mg/dL (ref 6–20)
CO2: 26 mmol/L (ref 22–32)
Calcium: 9.5 mg/dL (ref 8.9–10.3)
Chloride: 105 mmol/L (ref 98–111)
Creatinine, Ser: 0.79 mg/dL (ref 0.44–1.00)
GFR, Estimated: >60 mL/min (ref >60)
Glucose, Bld: 104 mg/dL — ABNORMAL HIGH (ref 70–99)
Potassium: 3.8 mmol/L (ref 3.5–5.1)
Sodium: 139 mmol/L (ref 135–145)
Total Bilirubin: 0.6 mg/dL (ref 0.3–1.2)
Total Protein: 6.8 g/dL (ref 6.5–8.1)

## 2020-12-19 LAB — TYPE AND SCREEN
ABO/RH(D): A POS
Antibody Screen: NEGATIVE

## 2020-12-19 LAB — PROTIME-INR
INR: 0.9 (ref 0.8–1.2)
Prothrombin Time: 12.4 seconds (ref 11.4–15.2)

## 2020-12-19 NOTE — Consult Note (Incomplete)
WOC Nurse requested for preoperative stoma site marking  Discussed surgical procedure and stoma creation with patient and family.  Explained role of the WOC nurse team.  Provided the patient with educational booklet and provided samples of pouching options.  Answered patient and family questions.   Examined patient lying, sitting, and standing in order to place the marking in the patient's visual field, away from any creases or abdominal contour issues and within the rectus muscle.  Attempted to mark below the patient's belt line.   Marked for colostomy in the LLQ  ____ cm to the left of the umbilicus and ____cm above/below the umbilicus.  Marked for ileostomy in the RLQ  ____cm to the right of the umbilicus and  ____ cm above/below the umbilicus.   Patient's abdomen cleansed with CHG wipes at site markings, allowed to air dry prior to marking.Covered mark with thin film transparent dressing to preserve mark until date of surgery.   WOC Nurse team will follow up with patient after surgery for continue ostomy care and teaching.  Analisa Sledd MSN, RN,CWOCN, CNS, CWON-AP [phone]  

## 2020-12-19 NOTE — Consult Note (Signed)
[place] Nurse requested for preoperative stoma site marking  Discussed surgical procedure and stoma creation with patient.  Explained role of the [place] nurse team.  Provided the patient with educational booklet and provided samples of pouching options.  Answered patient questions.   Examined patient lying and standing in order to place the marking in the patient's visual field, away from any creases or abdominal contour issues and within the rectus muscle.  Patient has protuberant abdomen so I have marked her above her belt line, explained rationale to the patient.   Marked for colostomy in the LLQ  6.5____ cm to the left of the umbilicus and _5.7___SV above the umbilicus.    Patient's abdomen cleansed with CHG wipes at site markings, allowed to air dry prior to marking.Covered mark with thin film transparent dressing to preserve mark until date of surgery.   [facility] Nurse team will follow up with patient after surgery for continue ostomy care and teaching.  [place] MSN, [place], [place], [place]

## 2020-12-22 ENCOUNTER — Ambulatory Visit: Payer: Self-pay | Admitting: Surgery

## 2020-12-23 ENCOUNTER — Encounter
Admission: RE | Disposition: A | Discharge status: Home / Self Care | Payer: Self-pay | Source: Home / Self Care | Attending: Surgery

## 2020-12-23 ENCOUNTER — Inpatient Hospital Stay
Admission: RE | Admit: 2020-12-23 | Discharge: 2020-12-25 | DRG: 331 | Disposition: A | Discharge status: Home / Self Care | Payer: Managed Care, Other (non HMO) | Attending: Surgery | Admitting: Surgery

## 2020-12-23 ENCOUNTER — Encounter: Payer: Self-pay | Admitting: Surgery

## 2020-12-23 ENCOUNTER — Inpatient Hospital Stay: Payer: Managed Care, Other (non HMO) | Admitting: Urgent Care

## 2020-12-23 ENCOUNTER — Other Ambulatory Visit: Payer: Self-pay

## 2020-12-23 DIAGNOSIS — Z888 Allergy status to other drugs, medicaments and biological substances status: Secondary | ICD-10-CM

## 2020-12-23 DIAGNOSIS — Z882 Allergy status to sulfonamides status: Secondary | ICD-10-CM

## 2020-12-23 DIAGNOSIS — Z823 Family history of stroke: Secondary | ICD-10-CM | POA: Diagnosis not present

## 2020-12-23 DIAGNOSIS — Z9049 Acquired absence of other specified parts of digestive tract: Secondary | ICD-10-CM

## 2020-12-23 DIAGNOSIS — Z86006 Personal history of melanoma in-situ: Secondary | ICD-10-CM

## 2020-12-23 DIAGNOSIS — N736 Female pelvic peritoneal adhesions (postinfective): Secondary | ICD-10-CM | POA: Diagnosis present

## 2020-12-23 DIAGNOSIS — Z79899 Other long term (current) drug therapy: Secondary | ICD-10-CM

## 2020-12-23 DIAGNOSIS — K5720 Diverticulitis of large intestine with perforation and abscess without bleeding: Secondary | ICD-10-CM | POA: Diagnosis not present

## 2020-12-23 DIAGNOSIS — K5792 Diverticulitis of intestine, part unspecified, without perforation or abscess without bleeding: Secondary | ICD-10-CM | POA: Diagnosis present

## 2020-12-23 DIAGNOSIS — K760 Fatty (change of) liver, not elsewhere classified: Secondary | ICD-10-CM | POA: Diagnosis present

## 2020-12-23 DIAGNOSIS — Z8249 Family history of ischemic heart disease and other diseases of the circulatory system: Secondary | ICD-10-CM | POA: Diagnosis not present

## 2020-12-23 DIAGNOSIS — Z85820 Personal history of malignant melanoma of skin: Secondary | ICD-10-CM

## 2020-12-23 DIAGNOSIS — E785 Hyperlipidemia, unspecified: Secondary | ICD-10-CM | POA: Diagnosis present

## 2020-12-23 DIAGNOSIS — Z885 Allergy status to narcotic agent status: Secondary | ICD-10-CM | POA: Diagnosis not present

## 2020-12-23 DIAGNOSIS — Z833 Family history of diabetes mellitus: Secondary | ICD-10-CM | POA: Diagnosis not present

## 2020-12-23 HISTORY — DX: Calculus of kidney: N20.0

## 2020-12-23 HISTORY — DX: Unspecified sensorineural hearing loss: H90.5

## 2020-12-23 HISTORY — DX: Fatty (change of) liver, not elsewhere classified: K76.0

## 2020-12-23 LAB — ABO/RH: ABO/RH(D): A POS

## 2020-12-23 SURGERY — COLECTOMY, SIGMOID, ROBOT-ASSISTED
Anesthesia: General

## 2020-12-23 MED ORDER — SODIUM CHLORIDE 0.9 % IV SOLN
1.0000 g | INTRAVENOUS | Status: AC
  Administered 2020-12-23: 1 g via INTRAVENOUS
  Filled 2020-12-23: qty 1

## 2020-12-23 MED ORDER — INDOCYANINE GREEN 25 MG IV SOLR
INTRAVENOUS | Status: DC | PRN
  Administered 2020-12-23: 5 mg via INTRAVENOUS

## 2020-12-23 MED ORDER — FENTANYL CITRATE (PF) 100 MCG/2ML IJ SOLN
INTRAMUSCULAR | Status: AC
  Filled 2020-12-23: qty 2

## 2020-12-23 MED ORDER — CHLORHEXIDINE GLUCONATE CLOTH 2 % EX PADS
6.0000 | MEDICATED_PAD | Freq: Once | CUTANEOUS | Status: AC
  Administered 2020-12-23: 6 via TOPICAL

## 2020-12-23 MED ORDER — CHLORHEXIDINE GLUCONATE 0.12 % MT SOLN: 15.0000 mL | Freq: Once | OROMUCOSAL | Status: AC

## 2020-12-23 MED ORDER — ALVIMOPAN 12 MG PO CAPS
ORAL_CAPSULE | ORAL | Status: AC
  Administered 2020-12-23: 12 mg via ORAL
  Filled 2020-12-23: qty 1

## 2020-12-23 MED ORDER — ROCURONIUM BROMIDE 10 MG/ML (PF) SYRINGE
PREFILLED_SYRINGE | INTRAVENOUS | Status: AC
  Filled 2020-12-23: qty 20

## 2020-12-23 MED ORDER — SODIUM CHLORIDE 0.9 % IV SOLN
INTRAVENOUS | Status: DC | PRN
  Administered 2020-12-23: 60 mL

## 2020-12-23 MED ORDER — PROCHLORPERAZINE EDISYLATE 10 MG/2ML IJ SOLN: 5.0000 mg | Freq: Four times a day (QID) | INTRAMUSCULAR | Status: DC | PRN

## 2020-12-23 MED ORDER — ACETAMINOPHEN 10 MG/ML IV SOLN
INTRAVENOUS | Status: AC
  Filled 2020-12-23: qty 100

## 2020-12-23 MED ORDER — BUPIVACAINE-EPINEPHRINE (PF) 0.25% -1:200000 IJ SOLN
INTRAMUSCULAR | Status: AC
  Filled 2020-12-23: qty 30

## 2020-12-23 MED ORDER — SUGAMMADEX SODIUM 200 MG/2ML IV SOLN
INTRAVENOUS | Status: DC | PRN
  Administered 2020-12-23: 200 mg via INTRAVENOUS

## 2020-12-23 MED ORDER — ROCURONIUM BROMIDE 100 MG/10ML IV SOLN
INTRAVENOUS | Status: DC | PRN
  Administered 2020-12-23: 20 mg via INTRAVENOUS
  Administered 2020-12-23: 10 mg via INTRAVENOUS
  Administered 2020-12-23: 20 mg via INTRAVENOUS
  Administered 2020-12-23: 30 mg via INTRAVENOUS
  Administered 2020-12-23 (×3): 20 mg via INTRAVENOUS
  Administered 2020-12-23: 50 mg via INTRAVENOUS

## 2020-12-23 MED ORDER — HEPARIN SODIUM (PORCINE) 5000 UNIT/ML IJ SOLN: 5000.0000 [IU] | Freq: Once | INTRAMUSCULAR | Status: AC

## 2020-12-23 MED ORDER — FENTANYL CITRATE (PF) 100 MCG/2ML IJ SOLN
INTRAMUSCULAR | Status: DC | PRN
  Administered 2020-12-23 (×5): 50 ug via INTRAVENOUS

## 2020-12-23 MED ORDER — KETOROLAC TROMETHAMINE 30 MG/ML IJ SOLN: 30.0000 mg | Freq: Four times a day (QID) | INTRAMUSCULAR | Status: DC | PRN

## 2020-12-23 MED ORDER — CELECOXIB 200 MG PO CAPS
ORAL_CAPSULE | ORAL | Status: AC
  Administered 2020-12-23: 200 mg via ORAL
  Filled 2020-12-23: qty 1

## 2020-12-23 MED ORDER — ACETAMINOPHEN 500 MG PO TABS
1000.0000 mg | ORAL_TABLET | Freq: Four times a day (QID) | ORAL | Status: DC
  Administered 2020-12-24 – 2020-12-25 (×5): 1000 mg via ORAL
  Filled 2020-12-23 (×8): qty 2

## 2020-12-23 MED ORDER — CELECOXIB 200 MG PO CAPS: 200.0000 mg | ORAL_CAPSULE | ORAL | Status: AC

## 2020-12-23 MED ORDER — KETAMINE HCL 50 MG/5ML IJ SOSY
PREFILLED_SYRINGE | INTRAMUSCULAR | Status: AC
  Filled 2020-12-23: qty 5

## 2020-12-23 MED ORDER — METHOCARBAMOL 500 MG PO TABS: 500.0000 mg | ORAL_TABLET | Freq: Four times a day (QID) | ORAL | Status: DC | PRN

## 2020-12-23 MED ORDER — LIDOCAINE HCL (CARDIAC) PF 100 MG/5ML IV SOSY
PREFILLED_SYRINGE | INTRAVENOUS | Status: DC | PRN
  Administered 2020-12-23: 40 mg via INTRAVENOUS

## 2020-12-23 MED ORDER — CHLORHEXIDINE GLUCONATE 0.12 % MT SOLN
OROMUCOSAL | Status: AC
  Administered 2020-12-23: 15 mL via OROMUCOSAL
  Filled 2020-12-23: qty 15

## 2020-12-23 MED ORDER — CHLORHEXIDINE GLUCONATE CLOTH 2 % EX PADS
6.0000 | MEDICATED_PAD | Freq: Once | CUTANEOUS | Status: AC
  Administered 2020-12-24: 6 via TOPICAL

## 2020-12-23 MED ORDER — PROPOFOL 10 MG/ML IV BOLUS
INTRAVENOUS | Status: DC | PRN
  Administered 2020-12-23: 150 mg via INTRAVENOUS

## 2020-12-23 MED ORDER — MELATONIN 5 MG PO TABS: 5.0000 mg | ORAL_TABLET | Freq: Every evening | ORAL | Status: DC | PRN

## 2020-12-23 MED ORDER — ONDANSETRON HCL 4 MG/2ML IJ SOLN
INTRAMUSCULAR | Status: DC | PRN
  Administered 2020-12-23: 4 mg via INTRAVENOUS

## 2020-12-23 MED ORDER — ONDANSETRON HCL 4 MG/2ML IJ SOLN: 4.0000 mg | Freq: Four times a day (QID) | INTRAMUSCULAR | Status: DC | PRN

## 2020-12-23 MED ORDER — GABAPENTIN 300 MG PO CAPS
ORAL_CAPSULE | ORAL | Status: AC
  Administered 2020-12-23: 300 mg via ORAL
  Filled 2020-12-23: qty 1

## 2020-12-23 MED ORDER — SEVOFLURANE IN SOLN
RESPIRATORY_TRACT | Status: AC
  Filled 2020-12-23: qty 250

## 2020-12-23 MED ORDER — SODIUM CHLORIDE 0.9 % IV SOLN: INTRAVENOUS | Status: DC

## 2020-12-23 MED ORDER — KETOROLAC TROMETHAMINE 30 MG/ML IJ SOLN
INTRAMUSCULAR | Status: AC
  Filled 2020-12-23: qty 1

## 2020-12-23 MED ORDER — ORAL CARE MOUTH RINSE: 15.0000 mL | Freq: Once | OROMUCOSAL | Status: AC

## 2020-12-23 MED ORDER — FENTANYL CITRATE (PF) 100 MCG/2ML IJ SOLN
25.0000 ug | INTRAMUSCULAR | Status: DC | PRN
  Administered 2020-12-23 (×2): 25 ug via INTRAVENOUS

## 2020-12-23 MED ORDER — CHLORHEXIDINE GLUCONATE CLOTH 2 % EX PADS: 6.0000 | MEDICATED_PAD | Freq: Once | CUTANEOUS | Status: DC

## 2020-12-23 MED ORDER — KETOROLAC TROMETHAMINE 30 MG/ML IJ SOLN
30.0000 mg | Freq: Four times a day (QID) | INTRAMUSCULAR | Status: DC
  Administered 2020-12-24 – 2020-12-25 (×7): 30 mg via INTRAVENOUS
  Filled 2020-12-23 (×9): qty 1

## 2020-12-23 MED ORDER — MIDAZOLAM HCL 2 MG/2ML IJ SOLN
INTRAMUSCULAR | Status: DC | PRN
  Administered 2020-12-23: 2 mg via INTRAVENOUS

## 2020-12-23 MED ORDER — PHENYLEPHRINE HCL (PRESSORS) 10 MG/ML IV SOLN
INTRAVENOUS | Status: DC | PRN
  Administered 2020-12-23 (×3): 100 ug via INTRAVENOUS
  Administered 2020-12-23: 50 ug via INTRAVENOUS
  Administered 2020-12-23 (×2): 100 ug via INTRAVENOUS

## 2020-12-23 MED ORDER — MIDAZOLAM HCL 2 MG/2ML IJ SOLN
INTRAMUSCULAR | Status: AC
  Filled 2020-12-23: qty 2

## 2020-12-23 MED ORDER — KETAMINE HCL 10 MG/ML IJ SOLN
INTRAMUSCULAR | Status: DC | PRN
  Administered 2020-12-23: 10 mg via INTRAVENOUS
  Administered 2020-12-23 (×2): 20 mg via INTRAVENOUS

## 2020-12-23 MED ORDER — PROCHLORPERAZINE MALEATE 10 MG PO TABS
10.0000 mg | ORAL_TABLET | Freq: Four times a day (QID) | ORAL | Status: DC | PRN
  Filled 2020-12-23: qty 1

## 2020-12-23 MED ORDER — ALVIMOPAN 12 MG PO CAPS: 12.0000 mg | ORAL_CAPSULE | ORAL | Status: AC

## 2020-12-23 MED ORDER — ACETAMINOPHEN 10 MG/ML IV SOLN
INTRAVENOUS | Status: DC | PRN
  Administered 2020-12-23: 1000 mg via INTRAVENOUS

## 2020-12-23 MED ORDER — BUPIVACAINE-EPINEPHRINE 0.25% -1:200000 IJ SOLN
INTRAMUSCULAR | Status: DC | PRN
  Administered 2020-12-23: 30 mL

## 2020-12-23 MED ORDER — GABAPENTIN 300 MG PO CAPS: 300.0000 mg | ORAL_CAPSULE | ORAL | Status: AC

## 2020-12-23 MED ORDER — ACETAMINOPHEN 500 MG PO TABS: 1000.0000 mg | ORAL_TABLET | ORAL | Status: AC

## 2020-12-23 MED ORDER — DEXAMETHASONE SODIUM PHOSPHATE 10 MG/ML IJ SOLN
INTRAMUSCULAR | Status: DC | PRN
  Administered 2020-12-23: 5 mg via INTRAVENOUS

## 2020-12-23 MED ORDER — ENOXAPARIN SODIUM 60 MG/0.6ML IJ SOSY
0.5000 mg/kg | PREFILLED_SYRINGE | INTRAMUSCULAR | Status: DC
  Administered 2020-12-24 – 2020-12-25 (×2): 47.5 mg via SUBCUTANEOUS
  Filled 2020-12-23 (×2): qty 0.6

## 2020-12-23 MED ORDER — ONDANSETRON 4 MG PO TBDP: 4.0000 mg | ORAL_TABLET | Freq: Four times a day (QID) | ORAL | Status: DC | PRN

## 2020-12-23 MED ORDER — MORPHINE SULFATE (PF) 2 MG/ML IV SOLN: 2.0000 mg | INTRAVENOUS | Status: DC | PRN

## 2020-12-23 MED ORDER — DEXMEDETOMIDINE (PRECEDEX) IN NS 20 MCG/5ML (4 MCG/ML) IV SYRINGE
PREFILLED_SYRINGE | INTRAVENOUS | Status: DC | PRN
  Administered 2020-12-23: 8 ug via INTRAVENOUS
  Administered 2020-12-23: 4 ug via INTRAVENOUS

## 2020-12-23 MED ORDER — LACTATED RINGERS IV SOLN: INTRAVENOUS | Status: DC

## 2020-12-23 MED ORDER — KETOROLAC TROMETHAMINE 30 MG/ML IJ SOLN
INTRAMUSCULAR | Status: DC | PRN
  Administered 2020-12-23: 30 mg via INTRAVENOUS

## 2020-12-23 MED ORDER — DIPHENHYDRAMINE HCL 25 MG PO TABS
25.0000 mg | ORAL_TABLET | Freq: Four times a day (QID) | ORAL | Status: DC | PRN
  Filled 2020-12-23: qty 1

## 2020-12-23 MED ORDER — BUPIVACAINE LIPOSOME 1.3 % IJ SUSP
INTRAMUSCULAR | Status: AC
  Filled 2020-12-23: qty 20

## 2020-12-23 MED ORDER — PROPOFOL 10 MG/ML IV BOLUS
INTRAVENOUS | Status: AC
  Filled 2020-12-23: qty 20

## 2020-12-23 MED ORDER — DEXMEDETOMIDINE (PRECEDEX) IN NS 20 MCG/5ML (4 MCG/ML) IV SYRINGE
PREFILLED_SYRINGE | INTRAVENOUS | Status: AC
  Filled 2020-12-23: qty 5

## 2020-12-23 MED ORDER — PREGABALIN 50 MG PO CAPS
100.0000 mg | ORAL_CAPSULE | Freq: Three times a day (TID) | ORAL | Status: DC
  Administered 2020-12-24 – 2020-12-25 (×5): 100 mg via ORAL
  Filled 2020-12-23 (×5): qty 2

## 2020-12-23 MED ORDER — ALVIMOPAN 12 MG PO CAPS: 12.0000 mg | ORAL_CAPSULE | ORAL | Status: DC

## 2020-12-23 MED ORDER — SODIUM CHLORIDE (PF) 0.9 % IJ SOLN
INTRAMUSCULAR | Status: AC
  Filled 2020-12-23: qty 50

## 2020-12-23 MED ORDER — HEPARIN SODIUM (PORCINE) 5000 UNIT/ML IJ SOLN
INTRAMUSCULAR | Status: AC
  Administered 2020-12-23: 5000 [IU] via SUBCUTANEOUS
  Filled 2020-12-23: qty 1

## 2020-12-23 MED ORDER — LACTATED RINGERS IV SOLN: INTRAVENOUS | Status: DC | PRN

## 2020-12-23 MED ORDER — ACETAMINOPHEN 500 MG PO TABS
ORAL_TABLET | ORAL | Status: AC
  Administered 2020-12-23: 1000 mg via ORAL
  Filled 2020-12-23: qty 2

## 2020-12-23 MED ORDER — OXYCODONE HCL 5 MG PO TABS
5.0000 mg | ORAL_TABLET | ORAL | Status: DC | PRN
  Administered 2020-12-25: 5 mg via ORAL
  Filled 2020-12-23: qty 1

## 2020-12-23 SURGICAL SUPPLY — 108 items
"PENCIL ELECTRO HAND CTR " (MISCELLANEOUS) ×1 IMPLANT
ADH SKN CLS APL DERMABOND .7 (GAUZE/BANDAGES/DRESSINGS) ×2
APL PRP STRL LF DISP 70% ISPRP (MISCELLANEOUS) ×1
BAG LAPAROSCOPIC 12 15 PORT 16 (BASKET) IMPLANT
BAG RETRIEVAL 12/15 (BASKET) ×2
CANISTER SUCT 1200ML W/VALVE (MISCELLANEOUS) ×2 IMPLANT
CANNULA REDUC XI 12-8 STAPL (CANNULA) ×2
CANNULA REDUCER 12-8 DVNC XI (CANNULA) ×1 IMPLANT
CATH ROBINSON RED A/P 16FR (CATHETERS) ×1 IMPLANT
CHLORAPREP W/TINT 26 (MISCELLANEOUS) ×2 IMPLANT
CLIP VESOLOCK LG 6/CT PURPLE (CLIP) ×2 IMPLANT
CLIP VESOLOCK MED LG 6/CT (CLIP) ×2 IMPLANT
COVER MAYO STAND REUSABLE (DRAPES) ×2 IMPLANT
COVER WAND RF STERILE (DRAPES) ×2 IMPLANT
DECANTER SPIKE VIAL GLASS SM (MISCELLANEOUS) ×2 IMPLANT
DEFOGGER SCOPE WARMER CLEARIFY (MISCELLANEOUS) ×2 IMPLANT
DERMABOND ADVANCED (GAUZE/BANDAGES/DRESSINGS) ×2
DERMABOND ADVANCED .7 DNX12 (GAUZE/BANDAGES/DRESSINGS) ×1 IMPLANT
DRAPE 3/4 80X56 (DRAPES) ×2 IMPLANT
DRAPE ARM DVNC X/XI (DISPOSABLE) ×4 IMPLANT
DRAPE COLUMN DVNC XI (DISPOSABLE) ×1 IMPLANT
DRAPE DA VINCI XI ARM (DISPOSABLE) ×8
DRAPE DA VINCI XI COLUMN (DISPOSABLE) ×2
DRAPE LEGGINS SURG 28X43 STRL (DRAPES) ×3 IMPLANT
DRAPE UNDER BUTTOCK W/FLU (DRAPES) ×2 IMPLANT
ELECT BLADE 6.5 EXT (BLADE) ×2 IMPLANT
ELECT CAUTERY BLADE 6.4 (BLADE) ×2 IMPLANT
ELECT REM PT RETURN 9FT ADLT (ELECTROSURGICAL) ×2
ELECTRODE REM PT RTRN 9FT ADLT (ELECTROSURGICAL) ×1 IMPLANT
GLOVE SURG ENC MOIS LTX SZ7 (GLOVE) ×6 IMPLANT
GLOVE SURG SYN 7.0 (GLOVE) ×4 IMPLANT
GLOVE SURG SYN 7.0 PF PI (GLOVE) IMPLANT
GOWN STRL REUS W/ TWL LRG LVL3 (GOWN DISPOSABLE) ×5 IMPLANT
GOWN STRL REUS W/TWL LRG LVL3 (GOWN DISPOSABLE) ×14
GRASPER LAPSCPC 5X45 DSP (INSTRUMENTS) ×2 IMPLANT
HANDLE YANKAUER SUCT BULB TIP (MISCELLANEOUS) ×3 IMPLANT
IRRIGATION STRYKERFLOW (MISCELLANEOUS) IMPLANT
IRRIGATOR STRYKERFLOW (MISCELLANEOUS) ×2
IV NS 1000ML (IV SOLUTION) ×2
IV NS 1000ML BAXH (IV SOLUTION) IMPLANT
KIT IMAGING PINPOINTPAQ (MISCELLANEOUS) ×1 IMPLANT
KIT PINK PAD W/HEAD ARE REST (MISCELLANEOUS) ×2
KIT PINK PAD W/HEAD ARM REST (MISCELLANEOUS) ×1 IMPLANT
LABEL OR SOLS (LABEL) ×2 IMPLANT
MANIFOLD NEPTUNE II (INSTRUMENTS) ×2 IMPLANT
NEEDLE HYPO 22GX1.5 SAFETY (NEEDLE) ×2 IMPLANT
NS IRRIG 500ML POUR BTL (IV SOLUTION) ×2 IMPLANT
OBTURATOR OPTICAL STANDARD 8MM (TROCAR)
OBTURATOR OPTICAL STND 8 DVNC (TROCAR)
OBTURATOR OPTICALSTD 8 DVNC (TROCAR) IMPLANT
PACK COLON CLEAN CLOSURE (MISCELLANEOUS) ×2 IMPLANT
PACK LAP CHOLECYSTECTOMY (MISCELLANEOUS) ×2 IMPLANT
PAD PREP 24X41 OB/GYN DISP (PERSONAL CARE ITEMS) ×2 IMPLANT
PENCIL ELECTRO HAND CTR (MISCELLANEOUS) ×2 IMPLANT
PORT ACCESS TROCAR AIRSEAL 5 (TROCAR) ×2 IMPLANT
RELOAD STAPLE 45 3.5 BLU DVNC (STAPLE) IMPLANT
RELOAD STAPLE 60 2.5 WHT DVNC (STAPLE) IMPLANT
RELOAD STAPLE 60 3.5 BLU DVNC (STAPLE) IMPLANT
RELOAD STAPLER 2.5X60 WHT DVNC (STAPLE) IMPLANT
RELOAD STAPLER 3.5X45 BLU DVNC (STAPLE) IMPLANT
RELOAD STAPLER 3.5X60 BLU DVNC (STAPLE) ×2 IMPLANT
SEAL CANN UNIV 5-8 DVNC XI (MISCELLANEOUS) ×3 IMPLANT
SEAL XI 5MM-8MM UNIVERSAL (MISCELLANEOUS) ×6
SEALER VESSEL DA VINCI XI (MISCELLANEOUS) ×2
SEALER VESSEL EXT DVNC XI (MISCELLANEOUS) IMPLANT
SET TRI-LUMEN FLTR TB AIRSEAL (TUBING) ×2 IMPLANT
SOL PREP PVP 2OZ (MISCELLANEOUS) ×2
SOLUTION ELECTROLUBE (MISCELLANEOUS) ×2 IMPLANT
SOLUTION PREP PVP 2OZ (MISCELLANEOUS) ×1 IMPLANT
SPONGE LAP 18X18 RF (DISPOSABLE) ×4 IMPLANT
SPONGE LAP 4X18 RFD (DISPOSABLE) ×3 IMPLANT
STAPLER 45 DA VINCI SURE FORM (STAPLE)
STAPLER 45 SUREFORM DVNC (STAPLE) IMPLANT
STAPLER 60 DA VINCI SURE FORM (STAPLE) ×2
STAPLER 60 SUREFORM DVNC (STAPLE) IMPLANT
STAPLER CANNULA SEAL DVNC XI (STAPLE) ×1 IMPLANT
STAPLER CANNULA SEAL XI (STAPLE) ×2
STAPLER CIRC 25MM 4.8MM THK (STAPLE) IMPLANT
STAPLER CIRCULAR MANUAL XL 25 (STAPLE) ×1 IMPLANT
STAPLER CIRCULAR MANUAL XL 29 (STAPLE) IMPLANT
STAPLER CIRCULAR MANUAL XL 33 (STAPLE) IMPLANT
STAPLER RELOAD 2.5X60 WHITE (STAPLE)
STAPLER RELOAD 2.5X60 WHT DVNC (STAPLE)
STAPLER RELOAD 3.5X45 BLU DVNC (STAPLE)
STAPLER RELOAD 3.5X45 BLUE (STAPLE)
STAPLER RELOAD 3.5X60 BLU DVNC (STAPLE) ×2
STAPLER RELOAD 3.5X60 BLUE (STAPLE) ×4
SURGILUBE 2OZ TUBE FLIPTOP (MISCELLANEOUS) ×2 IMPLANT
SUT DVC VLOC 3-0 CL 6 P-12 (SUTURE) ×2 IMPLANT
SUT MNCRL AB 4-0 PS2 18 (SUTURE) ×2 IMPLANT
SUT PDS AB 0 CT1 27 (SUTURE) ×4 IMPLANT
SUT SILK 2 0 (SUTURE) ×2
SUT SILK 2 0 SH (SUTURE) ×4 IMPLANT
SUT SILK 2 0SH CR/8 30 (SUTURE) ×2 IMPLANT
SUT SILK 2-0 30XBRD TIE 12 (SUTURE) ×1 IMPLANT
SUT VIC AB 2-0 SH 27 (SUTURE) ×2
SUT VIC AB 2-0 SH 27XBRD (SUTURE) IMPLANT
SUT VICRYL 0 AB UR-6 (SUTURE) ×2 IMPLANT
SUT VLOC 90 S/L VL9 GS22 (SUTURE) ×4 IMPLANT
SYR 20ML LL LF (SYRINGE) ×4 IMPLANT
SYR TOOMEY IRRIG 70ML (MISCELLANEOUS)
SYRINGE TOOMEY IRRIG 70ML (MISCELLANEOUS) ×1 IMPLANT
SYS TROCAR 1.5-3 SLV ABD GEL (ENDOMECHANICALS) ×2
SYSTEM TROCR 1.5-3 SLV ABD GEL (ENDOMECHANICALS) ×1 IMPLANT
TAPE TRANSPORE STRL 2 31045 (GAUZE/BANDAGES/DRESSINGS) ×2 IMPLANT
TRAY FOLEY MTR SLVR 16FR STAT (SET/KITS/TRAYS/PACK) ×2 IMPLANT
TRAY FOLEY SLVR 16FR LF STAT (SET/KITS/TRAYS/PACK) ×2 IMPLANT
TROCAR BALLN GELPORT 12X130M (ENDOMECHANICALS) ×2 IMPLANT

## 2020-12-23 NOTE — Anesthesia Procedure Notes (Cosign Needed)
Procedure Name: Intubation Date/Time: 12/23/2020 1:13 PM Performed by: Daiva Huge, RN Pre-anesthesia Checklist: Patient identified, Emergency Drugs available, Suction available and Patient being monitored Patient Re-evaluated:Patient Re-evaluated prior to induction Oxygen Delivery Method: Circle system utilized Preoxygenation: Pre-oxygenation with 100% oxygen Induction Type: IV induction Ventilation: Oral airway inserted - appropriate to patient size Laryngoscope Size: McGraph and 3 Grade View: Grade I Tube type: Oral Tube size: 6.5 mm Number of attempts: 1 Airway Equipment and Method: Stylet and Oral airway Placement Confirmation: ETT inserted through vocal cords under direct vision,  positive ETCO2 and breath sounds checked- equal and bilateral Secured at: 20 (AT TEETH) cm Tube secured with: Tape Dental Injury: Teeth and Oropharynx as per pre-operative assessment

## 2020-12-23 NOTE — Interval H&P Note (Signed)
History and Physical Interval Note:  12/23/2020 12:28 PM  Deborah Brandt  has presented today for surgery, with the diagnosis of diverticulitis.  The various methods of treatment have been discussed with the patient and family. After consideration of risks, benefits and other options for treatment, the patient has consented to  Procedure(s): XI ROBOT ASSISTED SIGMOID COLECTOMY, possible ostomy, RNFA to assist (N/A) as a surgical intervention.  The patient's history has been reviewed, patient examined, no change in status, stable for surgery.  I have reviewed the patient's chart and labs.  Questions were answered to the patient's satisfaction.     [place]

## 2020-12-23 NOTE — Op Note (Signed)
PROCEDURES: 1. Robotic assisted Laparoscopic Low anterior resection 2. Robotic Laparoscopic takedown of splenic flexure 3. ICG to check perfusion of Graft Anastomosis  Pre-operative Diagnosis: Recurrent diverticulitis  Post-operative Diagnosis: same  Surgeon: [facility]   Assistants: Otho Ket PA-C. ( required for the EEA anastomosis and for exposure)  Anesthesia: General endotracheal anesthesia  ASA Class: 2   Surgeon: Caroleen Hamman , MD FACS  Anesthesia: Gen. with endotracheal tube   Findings: Adhesions from the sigmoid to the pelvic wall Tension free anastomosis with very good perfusion  Using ICG and negative intraoperative leak   Estimated Blood Loss: 20cc                Specimens: colon       Complications: none         Condition: stable  Procedure Details  The patient was seen again in the Holding Room. The benefits, complications, treatment options, and expected outcomes were discussed with the patient. The risks of bleeding, infection, recurrence of symptoms, failure to resolve symptoms, anastomotic leak, bowel injury, any of which could require further surgery were reviewed with the patient.   The patient was taken to Operating Room, identified  and the procedure verified.  A Time Out was held and the above information confirmed.  Prior to the induction of general anesthesia, antibiotic prophylaxis was administered. VTE prophylaxis was in place. General endotracheal anesthesia was then administered and tolerated well. After the induction, the abdomen was prepped with Chloraprep and draped in the sterile fashion. The patient was positioned in lithotomy position. 3.5 cm incision was created Right lower quadrant. The abdominal cavity was entered under direct visualization and the Mini GelPort device was placed and pneumoperitoneum was obtained, no hemodynamic changes were apparent. . Three 8 mm robotic ports were placed under direct visualization and an additional 5  mm assist port was placed. Patient was positioned in steep trendelenburg and left side up. Robot was brought to the field and docked in the standard fashion. WE maintained visualization of our instruments at all times and avoided any collision between arms. I scrubbed out and went to the console. There were  adhesions from sigmoid to the abdominal wall that where lysed in the standard fashion with the scissors.   We identified the takeoff of the inferior mesenteric artery dissected the pedicle and divided using vessel sealer  in the standard fashion.   Using the sealer we were able to divide the mesorectum and and also divided the mesentery of the descending colon we mobilized the descending colon IN a medial to lateral fashion. We preserved the ureter at all times. Pelvic dissection was performed in a standard fashion, being in the areolar space anterior to the sacrum. THe mesorectum was divided with the vessel sealer. We dissected the rectum circumferentially and We divided at the mid rectum with standard 60 mm blue load  The white line of pot was identified and divided and  We were also able to mobilize the splenic flexure using scisors in the standard fashion. The mesentery of the descending colon was divided and we divided the mid descending colon with a blue load 64mm stapler. We used ICG green to make sure the distal descneding colon had good perfusion. We made sure we had adequate reach so the anastomosis could be performed tension free. The specimen was removed in a bag. The robot was undocked and  I scrubbed back in. Under direct visualization the descending colon stump was opened and measured the  diameter of the bowel A 25 mm dilator was perfect size. A pursestring was used after inserting the anvil device. We initially used a Covidien 25 mm EEA stapler. When the circular staple was inserted via the rectum tried to connect the Anvil portion to the Rod from the circular anastomosis device.  It was impossible to perform this laparoscopically due to the flimsiness of the metal on the anvil portion. I did not feel comfortable performing an anastomosis. I then ask for my usual EEA ethicon stapler. I secure the anvil to the rectal stump with a pursestring. Mr. Olean Ree [facility] was able to pass a 25 mm standard EEA stapler device through the anus and Under direct visualization we performed an end to end anastomosis with the EEA device. A leak test was performed inflating the colon with a Toomey syringe and a rubber catheter. No evidence of leak was observed. There was also adequate hemostasis.   All the laparoscopic ports were removed and a second look showed no evidence of any bleeding or any other injuries.  Liposomal marcaine was infiltrated at all incision sites in a full thickness fashion.  We changed gloves and  close the  abdomen with two -0 PDS sutures in a running fashion to close the anterior and posterior fascia respectively. The skin incisions were closed with 4-0 Monocryl. Dermabond was used to coat all the skin incisions. Needle and laparotomy count were correct and there were no immediate complications.  Caroleen Hamman, MD, FACS

## 2020-12-23 NOTE — Anesthesia Preprocedure Evaluation (Signed)
Anesthesia Evaluation  Patient identified by MRN, date of birth, ID band Patient awake    Reviewed: Allergy & Precautions, H&P , NPO status , Patient's Chart, lab work & pertinent test results  History of Anesthesia Complications (+) history of anesthetic complications  Airway Mallampati: III  TM Distance: <3 FB Neck ROM: full    Dental  (+) Chipped   Pulmonary neg shortness of breath, pneumonia,    Pulmonary exam normal        Cardiovascular Exercise Tolerance: Good (-) angina(-) Past MI and (-) DOE Normal cardiovascular exam+ Valvular Problems/Murmurs      Neuro/Psych  Headaches, negative psych ROS   GI/Hepatic negative GI ROS, Neg liver ROS, neg GERD  ,  Endo/Other  diabetes, Type 2  Renal/GU Renal disease     Musculoskeletal   Abdominal   Peds  Hematology negative hematology ROS (+)   Anesthesia Other Findings Past Medical History: No date: Complication of anesthesia     Comment:  after laparoscopy(1988) BP dropped.  Low BP after               epidurals for childbirth No date: Diverticulitis 02/21/2017: Dysplastic nevus     Comment:  R lat pubic groin - mod 02/21/2017: Dysplastic nevus     Comment:  L sup buttocks crease - mild  04/04/2017: Dysplastic nevus     Comment:  RUQA - mod to severe 05/03/2017 excision 04/04/2017: Dysplastic nevus     Comment:  R prox ant lat thigh - mild 07/18/2017: Dysplastic nevus     Comment:  L side inf costal margin - mild  10/26/2017: Dysplastic nevus     Comment:  R lat prox thigh near buttocks - mod 04/27/2018: Dysplastic nevus     Comment:  L mid ant lat thigh - mod 07/26/2018: Dysplastic nevus     Comment:  R ant med prox mid thigh - mod 1990, 2001: Gestational diabetes No date: Heart murmur     Comment:  at birth. no recent mention No date: Hepatic steatosis No date: History of kidney stones No date: HLD (hyperlipidemia) 02/22/2017: Melanoma ([place])      Comment:  R post prox lat thigh - Early invasive, Breslow's               0.40mm, [place] level 2 - excision 02/07/2018: Melanoma in situ ([place])     Comment:  L distal tricep - MMIS arising in a dysplastic nevus -               excision No date: Migraine No date: Motion sickness     Comment:  boats, back seat of car No date: Nephrolithiasis No date: Pneumonia No date: Sensorineural hearing loss (SNHL) of left ear  Past Surgical History: 08/22/2017: BREAST BIOPSY; Left     Comment:  neg, PREDOMINANTLY FATTY BREAST TISSUE 2004: COLONOSCOPY 12/02/2017: COLONOSCOPY WITH PROPOFOL; N/A     Comment:  Procedure: COLONOSCOPY WITH PROPOFOL;  Surgeon: Lucilla Lame, MD;  Location: [place];  Service:               Endoscopy;  Laterality: N/A; 1988: DIAGNOSTIC LAPAROSCOPY     Comment:  checking for endometriosis but negative [place], [place] x2: LUMBAR EPIDURAL INJECTION     Comment:  [place], [place] 12/02/2017:  POLYPECTOMY     Comment:  Procedure: POLYPECTOMY;  Surgeon: Lucilla Lame, MD;                Location: [place];  Service: Endoscopy;; 1998: SINUS EXPLORATION     Comment:  [place], [place]  No date: TUBAL LIGATION 1990: VAGINAL DELIVERY     Comment:  [facility], [place]     Reproductive/Obstetrics negative OB ROS                             Anesthesia Physical Anesthesia Plan  ASA: III  Anesthesia Plan: General ETT   Post-op Pain Management:    Induction: Intravenous  PONV Risk Score and Plan: Ondansetron, Dexamethasone, Midazolam and Treatment may vary due to age or medical condition  Airway Management Planned: Oral ETT  Additional Equipment:   Intra-op Plan:   Post-operative Plan: Extubation in OR  Informed Consent: I have reviewed the patients History and Physical, chart, labs and discussed the procedure including the  risks, benefits and alternatives for the proposed anesthesia with the patient or authorized representative who has indicated his/her understanding and acceptance.     Dental Advisory Given  Plan Discussed with: Anesthesiologist, CRNA and Surgeon  Anesthesia Plan Comments: (Patient consented for risks of anesthesia including but not limited to:  - adverse reactions to medications - damage to eyes, teeth, lips or other oral mucosa - nerve damage due to positioning  - sore throat or hoarseness - Damage to heart, brain, nerves, lungs, other parts of body or loss of life  Patient voiced understanding.)        Anesthesia Quick Evaluation

## 2020-12-23 NOTE — Transfer of Care (Signed)
Immediate Anesthesia Transfer of Care Note  Patient: Deborah Brandt  Procedure(s) Performed: XI ROBOT ASSISTED SIGMOID COLECTOMY,  RNFA to assist (N/A )  Patient Location: PACU  Anesthesia Type:General  Level of Consciousness: sedated  Airway & Oxygen Therapy: Patient Spontanous Breathing and Patient connected to face mask oxygen  Post-op Assessment: Report given to RN and Post -op Vital signs reviewed and stable  Post vital signs: Reviewed and stable  Last Vitals:  Vitals Value Taken Time  BP 120/86 12/23/20 1846  Temp    Pulse 88 12/23/20 1852  Resp 19 12/23/20 1852  SpO2 100 % 12/23/20 1852  Vitals shown include unvalidated device data.  Last Pain:  Vitals:   12/23/20 1004  TempSrc: Temporal  PainSc: 0-No pain         Complications: No complications documented.

## 2020-12-23 NOTE — Progress Notes (Signed)
Pt. Arrived post surgery. Care was assumed @ 2030 p.m. Pt. Sleepy but alert with voice and mild touch. 2L Oxygen via [place], saturation 100%.  Vital Sign stable. No respiratory distress. Continue to monitor Pt.

## 2020-12-23 NOTE — Progress Notes (Signed)
PHARMACIST - PHYSICIAN COMMUNICATION  CONCERNING:  Enoxaparin (Lovenox) for DVT Prophylaxis    RECOMMENDATION: Patient was prescribed enoxaprin 40mg  q24 hours for VTE prophylaxis.   There were no vitals filed for this visit.  There is no height or weight on file to calculate BMI.  Estimated Creatinine Clearance: 82 mL/min (by C-G formula based on SCr of 0.79 mg/dL).   Based on [place] patient is candidate for enoxaparin 0.5mg /kg TBW SQ every 24 hours based on BMI being >30.   DESCRIPTION: Pharmacy has adjusted enoxaparin dose per [facility] policy.  Patient is now receiving enoxaparin 0.5 mg.kg every 24 hours   Renda Rolls, PharmD, [facility] 12/23/2020 11:28 PM

## 2020-12-24 LAB — CBC
HCT: 37.1 % (ref 36.0–46.0)
Hemoglobin: 12.4 g/dL (ref 12.0–15.0)
MCH: 30.7 pg (ref 26.0–34.0)
MCHC: 33.4 g/dL (ref 30.0–36.0)
MCV: 91.8 fL (ref 80.0–100.0)
Platelets: 227 10*3/uL (ref 150–400)
RBC: 4.04 MIL/uL (ref 3.87–5.11)
RDW: 13.3 % (ref 11.5–15.5)
WBC: 11.5 10*3/uL — ABNORMAL HIGH (ref 4.0–10.5)
nRBC: 0 % (ref 0.0–0.2)

## 2020-12-24 LAB — BASIC METABOLIC PANEL
Anion gap: 8 (ref 5–15)
BUN: 14 mg/dL (ref 6–20)
CO2: 23 mmol/L (ref 22–32)
Calcium: 8.6 mg/dL — ABNORMAL LOW (ref 8.9–10.3)
Chloride: 104 mmol/L (ref 98–111)
Creatinine, Ser: 0.76 mg/dL (ref 0.44–1.00)
GFR, Estimated: >60 mL/min (ref >60)
Glucose, Bld: 138 mg/dL — ABNORMAL HIGH (ref 70–99)
Potassium: 4 mmol/L (ref 3.5–5.1)
Sodium: 135 mmol/L (ref 135–145)

## 2020-12-24 LAB — HIV ANTIBODY (ROUTINE TESTING W REFLEX): HIV Screen 4th Generation wRfx: NONREACTIVE

## 2020-12-24 MED ORDER — ALVIMOPAN 12 MG PO CAPS
12.0000 mg | ORAL_CAPSULE | Freq: Two times a day (BID) | ORAL | Status: DC
  Administered 2020-12-24: 12 mg via ORAL
  Filled 2020-12-24 (×2): qty 1

## 2020-12-24 NOTE — Plan of Care (Signed)
Pt. Stable and progressing Post operation

## 2020-12-24 NOTE — Progress Notes (Signed)
[place] Hospital Day(s): 1.   Post op day(s): 1 Day Post-Op.   Interval History:  Patient seen and examined No acute events or new complaints overnight.  Patient reports she is having mostly incisional soreness but pain regimen is helping No fever, chills, nausea, emesis She does have a leukocytosis to 11.5K; likely reactive Maintaining her renal sCr - 0.76; UO - 1.7L No electrolyte derangements She is on full liquid diet; tolerating well Still awaiting bowel function return Has not ambulated yet; waiting for foley to come out   Vital signs in last 24 hours: [min-max] current  Temp:  [97 F (36.1 C)-98.7 F (37.1 C)] 98.6 F (37 C) (05/04 0450) Pulse Rate:  [74-99] 87 (05/04 0450) Resp:  [11-21] 18 (05/04 0450) BP: (107-141)/(65-93) 109/70 (05/04 0450) SpO2:  [90 %-100 %] 97 % (05/04 0450)             Intake/Output last 2 shifts:  05/03 0701 - 05/04 0700 In: 2589.9 [I.V.:2589.9] Out: 1749 [Urine:1780]   Physical Exam:  Constitutional: alert, cooperative and no distress  Respiratory: breathing non-labored at rest  Cardiovascular: regular rate and sinus rhythm  Gastrointestinal: Soft, incisional soreness, non-distended, no rebound/guarding Genitourinary: Foley catheter in place Integumentary: Laparoscopic incisions are CDI with dermabond, no erythema or drainage   Labs:  CBC Latest Ref Rng & Units 12/24/2020 12/19/2020 09/09/2017  WBC 4.0 - 10.5 K/uL 11.5(H) 5.7 16.2(H)  Hemoglobin 12.0 - 15.0 g/dL 12.4 13.3 13.6  Hematocrit 36.0 - 46.0 % 37.1 40.0 39.7  Platelets 150 - 400 K/uL 227 233 202   CMP Latest Ref Rng & Units 12/24/2020 12/19/2020 04/18/2020  Glucose 70 - 99 mg/dL 138(H) 104(H) 105(H)  BUN 6 - 20 mg/dL 14 9 11   Creatinine 0.44 - 1.00 mg/dL 0.76 0.79 0.80  Sodium 135 - 145 mmol/L 135 139 140  Potassium 3.5 - 5.1 mmol/L 4.0 3.8 4.7  Chloride 98 - 111 mmol/L 104 105 101  CO2 22 - 32 mmol/L 23 26 24   Calcium 8.9 - 10.3  mg/dL 8.6(L) 9.5 9.7  Total Protein 6.5 - 8.1 g/dL - 6.8 6.6  Total Bilirubin 0.3 - 1.2 mg/dL - 0.6 0.3  Alkaline Phos 38 - 126 U/L - 49 58  AST 15 - 41 U/L - 20 23  ALT 0 - 44 U/L - 26 32     Imaging studies: No new pertinent imaging studies   Assessment/Plan:  55 y.o. female with likely reactive leukocytosis otherwise doing well awaiting bowel function return 1 Day Post-Op s/p robotic assisted laparoscopic LAR with colorectal anastomosis for recurrent diverticulitis    - Continue full liquid diet until bowel function returns   - Continue IVF Resuscitation; 50 ml/hr; if she can maintain PO hydration we can discontinue this later today   - Discontinue foley catheter   - Monitor abdominal examination; on-going bowel function  - Pain control prn; antiemetics prn    - Mobilize today; patient motivated  - Morning labs: CBC, BMP  - Home medications for comorbidities   All of the above findings and recommendations were discussed with the patient, and the medical team, and all of patient's questions were answered to her expressed satisfaction.  -- Edison Simon, PA-C [facility] 12/24/2020, 7:18 AM [phone] M-F: 7am - 4pm

## 2020-12-25 LAB — BASIC METABOLIC PANEL
Anion gap: 6 (ref 5–15)
BUN: 7 mg/dL (ref 6–20)
CO2: 24 mmol/L (ref 22–32)
Calcium: 8.7 mg/dL — ABNORMAL LOW (ref 8.9–10.3)
Chloride: 109 mmol/L (ref 98–111)
Creatinine, Ser: 0.74 mg/dL (ref 0.44–1.00)
GFR, Estimated: >60 mL/min (ref >60)
Glucose, Bld: 141 mg/dL — ABNORMAL HIGH (ref 70–99)
Potassium: 4.2 mmol/L (ref 3.5–5.1)
Sodium: 139 mmol/L (ref 135–145)

## 2020-12-25 LAB — CBC
HCT: 35.7 % — ABNORMAL LOW (ref 36.0–46.0)
Hemoglobin: 11.6 g/dL — ABNORMAL LOW (ref 12.0–15.0)
MCH: 30.9 pg (ref 26.0–34.0)
MCHC: 32.5 g/dL (ref 30.0–36.0)
MCV: 95.2 fL (ref 80.0–100.0)
Platelets: 218 10*3/uL (ref 150–400)
RBC: 3.75 MIL/uL — ABNORMAL LOW (ref 3.87–5.11)
RDW: 13.7 % (ref 11.5–15.5)
WBC: 8.1 10*3/uL (ref 4.0–10.5)
nRBC: 0 % (ref 0.0–0.2)

## 2020-12-25 LAB — SURGICAL PATHOLOGY

## 2020-12-25 MED ORDER — OXYCODONE HCL 5 MG PO TABS: 5.0000 mg | ORAL_TABLET | Freq: Four times a day (QID) | ORAL | 0 refills | Status: DC | PRN

## 2020-12-25 MED ORDER — METHOCARBAMOL 500 MG PO TABS: 500.0000 mg | ORAL_TABLET | Freq: Four times a day (QID) | ORAL | 0 refills | Status: DC | PRN

## 2020-12-25 MED ORDER — IBUPROFEN 800 MG PO TABS: 800.0000 mg | ORAL_TABLET | Freq: Three times a day (TID) | ORAL | 0 refills | Status: DC | PRN

## 2020-12-25 MED ORDER — PREGABALIN 100 MG PO CAPS: 100.0000 mg | ORAL_CAPSULE | Freq: Three times a day (TID) | ORAL | 0 refills | Status: DC

## 2020-12-25 NOTE — Progress Notes (Signed)
Mobility Specialist - Progress Note   12/25/20 1200  Mobility  Activity Ambulated in hall  Level of Assistance Standby assist, set-up cues, supervision of patient - no hands on  Assistive Device None (1- hand assist)  Distance Ambulated (ft) 180 ft  Mobility Response Tolerated well  Mobility performed by Mobility specialist  $Mobility charge 1 Mobility    Pre-mobility: 86 HR, 96% SpO2 Post-mobility: 91 HR, 99% SpO2   Pt ambulated in hallway with 1-hand assist. No LOB. Pain at incision site 9/10. Pt exited bedside via log rolling technique. Voiced dizziness upon standing, rest break for management. Ambulated to bathroom, independent in peri-care. Pt continued ambulation into hallway. Denied SOB on RA. RPE 4/10 with "getting out of bed" (pain management) reported as most difficult part of session. Pt requested to sit EOB upon return d/t lunch arrival. Family at bedside.    Kathee Delton Mobility Specialist 12/25/20, 12:29 PM

## 2020-12-25 NOTE — Progress Notes (Addendum)
[place] Hospital Day(s): 2.   Post op day(s): 2 Days Post-Op.   Interval History:  Patient seen and examined No acute events or new complaints overnight.  Patient reports she is doing well, still with incisional soreness mostly with movement No fever, chills, nausea, emesis Her leukocytosis has resolved, now 8.1K She is maintaining her renal function; sCr - 0.74; UO - unmeasured No significant electrolyte derangements She was advanced to soft diet; tolerating well She is having bowel function; + BM Ambulating some  Vital signs in last 24 hours: [min-max] current  Temp:  [97.7 F (36.5 C)-98.8 F (37.1 C)] 98.3 F (36.8 C) (05/05 0450) Pulse Rate:  [79-96] 79 (05/05 0450) Resp:  [15-18] 15 (05/05 0450) BP: (98-125)/(62-76) 105/70 (05/05 0450) SpO2:  [96 %-99 %] 97 % (05/05 0450)             Intake/Output last 2 shifts:  05/04 0701 - 05/05 0700 In: 1280 [P.O.:1280] Out: 350 [Urine:350]   Physical Exam:  Constitutional: alert, cooperative and no distress  Respiratory: breathing non-labored at rest  Cardiovascular: regular rate and sinus rhythm  Gastrointestinal: Soft, incisional soreness, non-distended, no rebound/guarding Integumentary: Laparoscopic incisions are CDI with dermabond, no erythema or drainage   Labs:  CBC Latest Ref Rng & Units 12/25/2020 12/24/2020 12/19/2020  WBC 4.0 - 10.5 K/uL 8.1 11.5(H) 5.7  Hemoglobin 12.0 - 15.0 g/dL 11.6(L) 12.4 13.3  Hematocrit 36.0 - 46.0 % 35.7(L) 37.1 40.0  Platelets 150 - 400 K/uL 218 227 233   CMP Latest Ref Rng & Units 12/25/2020 12/24/2020 12/19/2020  Glucose 70 - 99 mg/dL 141(H) 138(H) 104(H)  BUN 6 - 20 mg/dL 7 14 9   Creatinine 0.44 - 1.00 mg/dL 0.74 0.76 0.79  Sodium 135 - 145 mmol/L 139 135 139  Potassium 3.5 - 5.1 mmol/L 4.2 4.0 3.8  Chloride 98 - 111 mmol/L 109 104 105  CO2 22 - 32 mmol/L 24 23 26   Calcium 8.9 - 10.3 mg/dL 8.7(L) 8.6(L) 9.5  Total Protein 6.5 - 8.1 g/dL - -  6.8  Total Bilirubin 0.3 - 1.2 mg/dL - - 0.6  Alkaline Phos 38 - 126 U/L - - 49  AST 15 - 41 U/L - - 20  ALT 0 - 44 U/L - - 26     Imaging studies: No new pertinent imaging studies   Assessment/Plan:  55 y.o. female with return of bowel fucntion 2 Days Post-Op s/p robotic assisted laparoscopic LAR with colorectal anastomosis for recurrent diverticulitis    - Continue soft diet  - Monitor abdominal examination; on-going bowel function             - Pain control prn; antiemetics prn    - Continue mobilization   - Home medications for comorbidities     - Discharge Planning: I do feel she is ready for discharge at this point, but she is concerned due to lack of help at home today and would like to go home tomorrow morning, which is reasonable. Will plan on DC tomorrow.   All of the above findings and recommendations were discussed with the patient, and the medical team, and all of patient's questions were answered to her expressed satisfaction.  -- Edison Simon, PA-C [facility] 12/25/2020, 7:27 AM [phone] M-F: 7am - 4pm \

## 2020-12-25 NOTE — Discharge Instructions (Signed)
In addition to included general post-operative instructions,  Diet: Resume home diet gradually. Recommend softer, easier to digest foods for the first few days. It is not uncommon to have a decrease appetite after surgery. Recommending supplementing meals with protein shakes (ex: ensure, protein powder) as needed.   Activity: No heavy lifting >20 pounds (children, pets, laundry, garbage) for 6 weeks, but light activity and walking are encouraged. Do not drive or drink alcohol if taking narcotic pain medications or having pain that might distract from driving.  Wound care: You may shower/get incision wet with soapy water and pat dry (do not rub incisions), but no baths or submerging incision underwater until follow-up.   Medications: Resume all home medications. For mild to moderate pain: acetaminophen (Tylenol) or ibuprofen/naproxen (if no kidney disease). Combining Tylenol with alcohol can substantially increase your risk of causing liver disease. Narcotic pain medications, if prescribed, can be used for severe pain, though may cause nausea, constipation, and drowsiness. Do not combine Tylenol and Percocet (or similar) within a 6 hour period as Percocet (and similar) contain(s) Tylenol. If you do not need the narcotic pain medication, you do not need to fill the prescription.  Call office [phone] / [phone]) at any time if any questions, worsening pain, fevers/chills, bleeding, drainage from incision site, or other concerns.

## 2020-12-25 NOTE — Discharge Summary (Signed)
[facility] SURGICAL DISCHARGE SUMMARY  Patient ID: Deborah Brandt MRN: [id number] DOB/AGE: 1965/12/06 55 y.o.  Admit date: 12/23/2020 Discharge date: 12/25/2020  Discharge Diagnoses Patient Active Problem List   Diagnosis Date Noted  . S/P laparoscopic-assisted sigmoidectomy 12/23/2020   Consultants None  Procedures 12/23/2020 Robotic assisted laparoscopic LAR with colorectal anastomosis   HPI: Deborah Brandt is a 55 y.o. female with a history of recurrent diverticulitis who presents to [facility] on 05/03 for scheduled laparoscopic sigmoid colectomy with Dr Dahlia Byes.   Hospital Course: Informed consent was obtained and documented, and patient underwent uneventful robotic assisted laparoscopic LAR with colorectal anastomosis (Dr Dahlia Byes, 12/23/2020).  Post-operatively, patient did well and had return of bowel function on POD1. Advancement of patient's diet and ambulation were well-tolerated. The remainder of patient's hospital course was essentially unremarkable, and discharge planning was initiated accordingly with patient safely able to be discharged home with appropriate discharge instruction, pain control, and outpatient follow-up after all of her questions were answered to her expressed satisfaction.   Discharge Condition: Good    Allergies as of 12/25/2020      Reactions   Ciprofloxacin Hives, Nausea And Vomiting   During IV cipro at ER   Levaquin [levofloxacin] Hives   Sulfa Antibiotics Hives   Augmentin [amoxicillin-pot Clavulanate] Hives, Itching   Codeine Nausea And Vomiting   Doxycycline Hives   Eucalyptus Oil Swelling   Eyes swell shut   Keflex [cephalexin] Other (See Comments)   Swelling of face, burning sensation in face, face is red,swollen, skin is cracking on cheeks,nose and chin and bleeding.       Medication List    STOP taking these medications   metroNIDAZOLE 500 MG tablet Commonly known as: Flagyl   neomycin 500 MG tablet Commonly known as:  MYCIFRADIN   polyethylene glycol powder 17 GM/SCOOP powder Commonly known as: MiraLax     TAKE these medications   acetaminophen 500 MG tablet Commonly known as: TYLENOL Take 1,000 mg by mouth every 6 (six) hours as needed for mild pain or moderate pain.   chlorpheniramine 4 MG tablet Commonly known as: CHLOR-TRIMETON Take 4 mg by mouth 2 (two) times daily as needed (itching).   diphenhydrAMINE 25 MG tablet Commonly known as: BENADRYL Take 25 mg by mouth every 6 (six) hours as needed.   ibuprofen 800 MG tablet Commonly known as: ADVIL Take 1 tablet (800 mg total) by mouth every 8 (eight) hours as needed.   melatonin 5 MG Tabs Take 5 mg by mouth at bedtime as needed (Sleep).   methocarbamol 500 MG tablet Commonly known as: ROBAXIN Take 1 tablet (500 mg total) by mouth every 6 (six) hours as needed for muscle spasms.   oxyCODONE 5 MG immediate release tablet Commonly known as: Oxy IR/ROXICODONE Take 1 tablet (5 mg total) by mouth every 6 (six) hours as needed for severe pain or breakthrough pain.   pregabalin 100 MG capsule Commonly known as: LYRICA Take 1 capsule (100 mg total) by mouth 3 (three) times daily for 14 days.   triamcinolone ointment 0.5 % Commonly known as: KENALOG Apply 1 application topically 2 (two) times daily.         Follow-up Information    P[place], [place] F, MD. Schedule an appointment as soon as possible for a visit in 2 week(s).   Specialty: General Surgery Why: s/p laparoscopic sigmoid colectomy  Contact information: [address] [place] [address] [phone]  Time spent on discharge management including discussion of hospital course, clinical condition, outpatient instructions, prescriptions, and follow up with the patient and members of the medical team: >30 minutes  -- Edison Simon , PA-C [facility]  12/25/2020, 5:39 PM [phone] M-F: 7am - 4pm

## 2020-12-25 NOTE — Anesthesia Postprocedure Evaluation (Signed)
Anesthesia Post Note  Patient: Deborah Brandt  Procedure(s) Performed: XI ROBOT ASSISTED SIGMOID COLECTOMY,  RNFA to assist (N/A )  Patient location during evaluation: PACU Anesthesia Type: General Level of consciousness: awake and alert Pain management: pain level controlled Vital Signs Assessment: post-procedure vital signs reviewed and stable Respiratory status: spontaneous breathing, nonlabored ventilation, respiratory function stable and patient connected to nasal cannula oxygen Cardiovascular status: blood pressure returned to baseline and stable Postop Assessment: no apparent nausea or vomiting Anesthetic complications: no   No complications documented.   Last Vitals:  Vitals:   12/25/20 0829 12/25/20 1107  BP: 115/67 112/68  Pulse: 81 82  Resp: 16 18  Temp: 37.3 C 36.8 C  SpO2: 96% 100%    Last Pain:  Vitals:   12/25/20 1107  TempSrc: Oral  PainSc:                  Arita Miss

## 2021-01-06 ENCOUNTER — Telehealth: Payer: Self-pay | Admitting: Surgery

## 2021-01-06 NOTE — Telephone Encounter (Signed)
Patient had surgery on 12/23/20 sigmoid colectomy with Dr. Dahlia Byes and patient is wanting to know if she can wear bike shorts which are a bit tight.  Not sure if she can wear these right after her surgery.  Please call.  Thank you.

## 2021-01-06 NOTE — Telephone Encounter (Signed)
Let patient know that she may wear bike shorts if they make her feel better. She is aware to check her incisions and report any problems.

## 2021-01-12 ENCOUNTER — Ambulatory Visit (INDEPENDENT_AMBULATORY_CARE_PROVIDER_SITE_OTHER): Payer: Managed Care, Other (non HMO) | Admitting: Surgery

## 2021-01-12 ENCOUNTER — Encounter: Payer: Self-pay | Admitting: Surgery

## 2021-01-12 ENCOUNTER — Other Ambulatory Visit: Payer: Self-pay

## 2021-01-12 VITALS — BP 134/84 | HR 69 | Temp 98.4°F | Ht 60.0 in | Wt 205.0 lb

## 2021-01-12 DIAGNOSIS — Z09 Encounter for follow-up examination after completed treatment for conditions other than malignant neoplasm: Secondary | ICD-10-CM

## 2021-01-12 MED ORDER — GABAPENTIN 300 MG PO CAPS: 300.0000 mg | ORAL_CAPSULE | Freq: Three times a day (TID) | ORAL | 1 refills | Status: DC

## 2021-01-12 NOTE — Patient Instructions (Addendum)
You may use ice the the lower abdomen several times a day. Alternatively you may use heat if that helps better.  We have sent you in a prescription for Gabapentin.   Follow up in 2 months.    GENERAL POST-OPERATIVE PATIENT INSTRUCTIONS   WOUND CARE INSTRUCTIONS: Try to keep the wound dry and avoid ointments on the wound unless directed to do so.  If the wound becomes bright red and painful or starts to drain infected material that is not clear, please contact your physician immediately.  If the wound is mildly pink and has a thick firm ridge underneath it, this is normal, and is referred to as a healing ridge.  This will resolve over the next 4-6 weeks.  BATHING: You may shower if you have been informed of this by your surgeon. However, Please do not submerge in a tub, hot tub, or pool until incisions are completely sealed or have been told by your surgeon that you may do so.  DIET:  You may eat any foods that you can tolerate.  It is a good idea to eat a high fiber diet and take in plenty of fluids to prevent constipation.  If you do become constipated you may want to take a mild laxative or take ducolax tablets on a daily basis until your bowel habits are regular.  Constipation can be very uncomfortable, along with straining, after recent surgery.  ACTIVITY: You are encouraged to walk and engage in light activity for the next two weeks.  You should not lift more than 20 pounds for 6 weeks after surgery as it could put you at increased risk for complications.  Twenty pounds is roughly equivalent to a plastic bag of groceries. At that time- Listen to your body when lifting, if you have pain when lifting, stop and then try again in a few days. Soreness after doing exercises or activities of daily living is normal as you get back in to your normal routine.  MEDICATIONS:  Try to take narcotic medications and anti-inflammatory medications, such as tylenol, ibuprofen, naprosyn, etc., with food.  This  will minimize stomach upset from the medication.  Should you develop nausea and vomiting from the pain medication, or develop a rash, please discontinue the medication and contact your physician.  You should not drive, make important decisions, or operate machinery when taking narcotic pain medication.  SUNBLOCK Use sun block to incision area over the next year if this area will be exposed to sun. This helps decrease scarring and will allow you avoid a permanent darkened area over your incision.  QUESTIONS:  Please feel free to call our office if you have any questions, and we will be glad to assist you.

## 2021-01-12 NOTE — Progress Notes (Signed)
This is a 55 year old female status post low anterior resection for recurrent recalcitrant diverticulitis on 12/23/20.  She is doing very well.  No fevers no chills.  She is able to tolerate diet.  Her left lower quadrant pain is gone but now she does have incisional soreness.  Pathology discussed with her in detail.  Exam: No acute distress Abdomen: Soft.  Mild incisional tenderness without peritonitis.  Incisions clean dry and intact without infection.  A/P doing very well.  No surgical issues at this time.  Return to clinic in 6 weeks or so.  She is very [employer] attorney

## 2021-02-25 ENCOUNTER — Ambulatory Visit (INDEPENDENT_AMBULATORY_CARE_PROVIDER_SITE_OTHER): Payer: Managed Care, Other (non HMO) | Admitting: Surgery

## 2021-02-25 ENCOUNTER — Encounter: Payer: Self-pay | Admitting: Surgery

## 2021-02-25 ENCOUNTER — Other Ambulatory Visit: Payer: Self-pay

## 2021-02-25 VITALS — BP 121/85 | HR 83 | Temp 98.5°F | Ht 60.0 in | Wt 207.0 lb

## 2021-02-25 DIAGNOSIS — N6019 Diffuse cystic mastopathy of unspecified breast: Secondary | ICD-10-CM

## 2021-02-25 NOTE — Progress Notes (Signed)
This is 2 months out after robotic sigmoid colectomy for diverticulitis.  She is doing very well.  No fevers no chills no evidence of recurrent diverticulitis she is very appreciative. No fevers no chills tolerating diet and having bowel movements.  PE NAD Abd: Soft nontender incisions healed without evidence of infection there is no evidence of hernias.  No peritonitis.  A/P doing great after sigmoid colectomy.  No complications.  Needs Mammogram and we will see her after she completes appropiate studies

## 2021-02-25 NOTE — Patient Instructions (Addendum)
Follow-up with our office as needed.  Please call and ask to speak with a nurse if you develop questions or concerns.   Follow up here in August after your mammogram.

## 2021-02-27 ENCOUNTER — Encounter: Payer: Self-pay | Admitting: Family Medicine

## 2021-03-03 ENCOUNTER — Other Ambulatory Visit: Payer: Self-pay

## 2021-03-03 ENCOUNTER — Ambulatory Visit
Admission: RE | Admit: 2021-03-03 | Discharge: 2021-03-03 | Disposition: A | Discharge status: Home / Self Care | Payer: Managed Care, Other (non HMO) | Source: Ambulatory Visit | Attending: Surgery | Admitting: Surgery

## 2021-03-03 DIAGNOSIS — N6019 Diffuse cystic mastopathy of unspecified breast: Secondary | ICD-10-CM

## 2021-03-04 ENCOUNTER — Other Ambulatory Visit: Payer: Self-pay | Admitting: Surgery

## 2021-03-04 DIAGNOSIS — R928 Other abnormal and inconclusive findings on diagnostic imaging of breast: Secondary | ICD-10-CM

## 2021-03-04 DIAGNOSIS — N6310 Unspecified lump in the right breast, unspecified quadrant: Secondary | ICD-10-CM

## 2021-03-10 ENCOUNTER — Ambulatory Visit
Admission: RE | Admit: 2021-03-10 | Discharge: 2021-03-10 | Disposition: A | Discharge status: Home / Self Care | Payer: Managed Care, Other (non HMO) | Source: Ambulatory Visit | Attending: Surgery | Admitting: Surgery

## 2021-03-10 ENCOUNTER — Other Ambulatory Visit: Payer: Self-pay

## 2021-03-10 DIAGNOSIS — R928 Other abnormal and inconclusive findings on diagnostic imaging of breast: Secondary | ICD-10-CM | POA: Diagnosis present

## 2021-03-10 DIAGNOSIS — N6310 Unspecified lump in the right breast, unspecified quadrant: Secondary | ICD-10-CM | POA: Diagnosis present

## 2021-03-11 LAB — SURGICAL PATHOLOGY

## 2021-04-09 ENCOUNTER — Encounter: Payer: Managed Care, Other (non HMO) | Admitting: Family Medicine

## 2021-04-19 ENCOUNTER — Encounter: Payer: Self-pay | Admitting: Family Medicine

## 2021-04-20 ENCOUNTER — Encounter: Payer: Self-pay | Admitting: Family Medicine

## 2021-04-20 ENCOUNTER — Other Ambulatory Visit: Payer: Self-pay

## 2021-04-20 ENCOUNTER — Ambulatory Visit: Payer: Managed Care, Other (non HMO) | Admitting: Surgery

## 2021-04-20 ENCOUNTER — Ambulatory Visit: Payer: Managed Care, Other (non HMO) | Admitting: Family Medicine

## 2021-04-20 VITALS — BP 118/80 | HR 82 | Temp 98.1°F | Wt 209.8 lb

## 2021-04-20 DIAGNOSIS — J0100 Acute maxillary sinusitis, unspecified: Secondary | ICD-10-CM | POA: Diagnosis not present

## 2021-04-20 DIAGNOSIS — L03211 Cellulitis of face: Secondary | ICD-10-CM

## 2021-04-20 MED ORDER — AMOXICILLIN 875 MG PO TABS: 875.0000 mg | ORAL_TABLET | Freq: Two times a day (BID) | ORAL | 0 refills | Status: DC

## 2021-04-20 NOTE — Telephone Encounter (Signed)
See note from today

## 2021-04-20 NOTE — Telephone Encounter (Signed)
PLEASE NOTE: All timestamps contained within this report are represented as [place] Standard Time. CONFIDENTIALTY NOTICE: This fax transmission is intended only for the addressee. It contains information that is legally privileged, confidential or otherwise protected from use or disclosure. If you are not the intended recipient, you are strictly prohibited from reviewing, disclosing, copying using or disseminating any of this information or taking any action in reliance on or regarding this information. If you have received this fax in error, please notify us immediately by telephone so that we can arrange for its return to [place]. Phone: [phone], Toll-Free: [phone], Fax: [phone] Page: 1 of 2 Call Id: [id number] [place] [facility] - Client TELEPHONE ADVICE RECORD AccessNurse Patient Name: ABBIEGALE Brandt [facility] Gender: Female DOB: Sep 27, 1965 Age: 55 Y 3 M 2 D Return Phone Number: [phone] (Primary) Address: City/ State/ Zip: [place]  [address] Client [place] [facility] - Client Client Site [place] Physician Deborah Brandt - MD Contact Type Call Who Is Calling Patient / Member / Family / Caregiver Call Type Triage / Clinical Relationship To Patient Self Return Phone Number [phone] (Primary) Chief Complaint NUMBNESS/TINGLING- sudden on one side of the body or face Reason for Call Symptomatic / Request for [place] noticed a bump beginning on her forehead beginning on WED and since then it has grown, no head on it, but it it hurts terribly. Currently the size of a dime. Pain in face all one one side to ear, jaw. Now face is swollen. Feels prickly - compares to if you are in attic and your skin touches the insulation. Translation No Nurse Assessment Nurse: Deborah Negus, RN, Deborah Brandt Date/Time Deborah Brandt Time): 04/20/2021 8:18:18 AM Confirm and document reason for call.  If symptomatic, describe symptoms. ---She noticed a bump beginning on her forehead beginning on WED and since then it has grown, no head on it, but it it hurts terribly. Currently the size of a dime. Pain in face all one one side to ear, jaw. Now face is swollen. Feels prickly - compares to if you are in attic and your skin touches the insulation. She doe snot recall any injury or bite, no fever, but feels warm to touch. No issue with breathing or swallowing Does the patient have any new or worsening symptoms? ---Yes Will a triage be completed? ---Yes Related visit to physician within the last 2 weeks? ---No Does the PT have any chronic conditions? (i.e. diabetes, asthma, this includes High risk factors for pregnancy, etc.) ---No Is this a behavioral health or substance abuse call? ---No PLEASE NOTE: All timestamps contained within this report are represented as [place] Standard Time. CONFIDENTIALTY NOTICE: This fax transmission is intended only for the addressee. It contains information that is legally privileged, confidential or otherwise protected from use or disclosure. If you are not the intended recipient, you are strictly prohibited from reviewing, disclosing, copying using or disseminating any of this information or taking any action in reliance on or regarding this information. If you have received this fax in error, please notify us immediately by telephone so that we can arrange for its return to [place]. Phone: [phone], Toll-Free: [phone], Fax: [phone] Page: 2 of 2 Call Id: [id number] Guidelines Guideline Title Affirmed Question Affirmed Notes Nurse Date/Time Deborah Brandt Time) Skin Lump or Localized Swelling Patient sounds very sick or weak to the triager Deborah Negus, RN, Deborah Brandt 04/20/2021 8:19:54 AM Disp. Time Deborah Brandt Time) Disposition Final User 04/20/2021 8:16:13  AM Send to Urgent Deborah Brandt 04/20/2021 8:21:19 AM Go to ED Now (or PCP triage) Yes  Deborah Negus, RN, [place] Disagree/Comply Disagree Caller Understands Yes PreDisposition Call Doctor Care Advice Given Per Guideline GO TO ED NOW (OR PCP TRIAGE): CARE ADVICE given per Skin Swelling or Lump (Adult) guideline. Referrals GO TO FACILITY REFUSED

## 2021-04-20 NOTE — Progress Notes (Signed)
Subjective:     Deborah Brandt is a 55 y.o. female presenting for Headache (X 6 days From frontal lobe to L temporal lobe. Sensitive to touch/brush hair ), Mass (X 6 days, Between eyebrows. Not itchy, but painful to touch and getting bigger. ), Facial Swelling (L eye and sinus cavities on L side of face. ), and Oral Pain (Sensitive inside of mouth)     Headache   Oral Pain    Headache  Associated symptoms include ear pain and tinnitus. Pertinent negatives include no dizziness, fever, hearing loss, numbness or weakness.  Oral Pain  Pertinent negatives include no fever.      #Headache - started with prickly sensation  - 1 week ago - then started noticing the lesion on the forehead which has increased in size - painful lesion and her left side the face hurt - jaw, eye, hair pain all on the left side -    Hx of chronic sinus issues - but the pain is new    Taking tylenol/advil w/o improvement Eating and drinking OK     Review of Systems  Constitutional:  Negative for chills and fever.  HENT:  Positive for ear pain, facial swelling and tinnitus. Negative for hearing loss.   Eyes:  Positive for discharge. Negative for visual disturbance.  Neurological:  Positive for headaches. Negative for dizziness, weakness and numbness.    Review of Systems  Neurological:  Positive for headaches.    Social History   Tobacco Use  Smoking Status Never  Smokeless Tobacco Never        Objective:    BP Readings from Last 3 Encounters:  04/20/21 118/80  02/25/21 121/85  01/12/21 134/84   Wt Readings from Last 3 Encounters:  04/20/21 209 lb 12 oz (95.1 kg)  02/25/21 207 lb (93.9 kg)  01/12/21 205 lb (93 kg)    BP 118/80   Pulse 82   Temp 98.1 F (36.7 C) (Temporal)   Wt 209 lb 12 oz (95.1 kg)   LMP 08/24/2015 (Approximate)   SpO2 98%   BMI 40.96 kg/m    Physical Exam Constitutional:      General: She is not in acute distress.    Appearance: She is well-developed.  She is not diaphoretic.  HENT:     Head: Normocephalic and atraumatic.     Comments: Facial ttp along the forehead and maxillary left side of the face.     Right Ear: External ear normal.     Left Ear: External ear normal.     Nose: Nose normal.     Mouth/Throat:     Mouth: Mucous membranes are moist.     Pharynx: No posterior oropharyngeal erythema.  Eyes:     General: No scleral icterus.    Extraocular Movements: Extraocular movements intact.     Right eye: No nystagmus.     Left eye: No nystagmus.     Conjunctiva/sclera: Conjunctivae normal.     Pupils: Pupils are equal, round, and reactive to light. Pupils are equal.  Cardiovascular:     Rate and Rhythm: Normal rate and regular rhythm.     Heart sounds: No murmur heard. Pulmonary:     [place]: Pulmonary effort is normal. No respiratory distress.     Breath sounds: Normal breath sounds. No wheezing.  Musculoskeletal:     Cervical back: Normal range of motion and neck supple.  Skin:    General: Skin is warm and dry.     Capillary  Refill: Capillary refill takes less than 2 seconds.     Comments: Erythematous nodule with ttp w/o fluctuance between the eyebrows with erythema extending along the eyebrows.   Neurological:     Mental Status: She is alert. Mental status is at baseline.     Cranial Nerves: No cranial nerve deficit.     Sensory: No sensory deficit.     Motor: No weakness.     Coordination: Coordination normal.     Comments: Touch on left forehead vs right is different 2/2 to pain  Psychiatric:        Mood and Affect: Mood normal.        Behavior: Behavior normal.           Assessment & Plan:   Problem List Items Addressed This Visit   None Visit Diagnoses     Facial cellulitis    -  Primary   Relevant Medications   amoxicillin (AMOXIL) 875 MG tablet   Acute non-recurrent maxillary sinusitis       Relevant Medications   diphenhydrAMINE (BENADRYL) 25 MG tablet   amoxicillin (AMOXIL) 875 MG tablet       ?cellulitis vs sinusitis. Neuro exam normal apart from pain on the left side compared to the right with face. PT with multiple drug allergies. Will treat with amoxicillin (she believes she has tolerated).   If no improvement return to see PCP - with facial/head pain if no change consider imaging or blood work to evaluate for other causes  I spent >25 minutes with pt , obtaining history, examining, reviewing chart, documenting encounter and discussing the above plan of care.   Return if symptoms worsen or fail to improve.  Lesleigh Noe, MD  This visit occurred during the SARS-CoV-2 public health emergency.  Safety protocols were in place, including screening questions prior to the visit, additional usage of staff PPE, and extensive cleaning of exam room while observing appropriate contact time as indicated for disinfecting solutions.

## 2021-04-20 NOTE — Patient Instructions (Signed)
Return if head pain, redness not improving in 1-2 days  If pain and redness improves but lesion does not go away - see dermatology

## 2021-04-20 NOTE — Telephone Encounter (Signed)
Patient has an appointment today with Dr. Einar Pheasant

## 2021-04-21 DIAGNOSIS — B029 Zoster without complications: Secondary | ICD-10-CM | POA: Insufficient documentation

## 2021-04-22 ENCOUNTER — Encounter: Payer: Self-pay | Admitting: Dermatology

## 2021-04-22 ENCOUNTER — Ambulatory Visit: Payer: Managed Care, Other (non HMO) | Admitting: Dermatology

## 2021-04-22 ENCOUNTER — Other Ambulatory Visit: Payer: Self-pay

## 2021-04-22 DIAGNOSIS — R21 Rash and other nonspecific skin eruption: Secondary | ICD-10-CM | POA: Diagnosis not present

## 2021-04-22 MED ORDER — CLINDAMYCIN HCL 150 MG PO CAPS: 450.0000 mg | ORAL_CAPSULE | Freq: Four times a day (QID) | ORAL | 0 refills | Status: DC

## 2021-04-22 NOTE — Patient Instructions (Addendum)
Favor erysipelas over cellulitis  Can take ibuprofen 600 mg up to every 6-8 hours to help with pain and inflammation. Take with water and wait 15-30 minutes before lying down. Stop if you develop belly pain.  Start clindamycin 450 mg every 6 hours for the next 7 days.  Continue amoxicillin '875mg'$  twice daily as prescribed by PCP  If you develop fever, chills, vision changes, swelling at the eyelid, continued spreading redness please go seek medical care at the Emergency Department as you may need more evaluation and IV antibiotics.   Typically we expect to see the infection stop worsening within 24 hours and get better within 48 hours of starting the correct antibiotic.   Recommend keeping follow-up with PCP Friday to recheck.  Recommend taking daily probiotic or daily yogurt for the next 3 months.   If you have any questions or concerns for your doctor, please call our main line at [phone] and press option 4 to reach your doctor's medical assistant. If no one answers, please leave a voicemail as directed and we will return your call as soon as possible. Messages left after 4 pm will be answered the following business day.   You may also send [place] a message via [place]. We typically respond to MyChart messages within 1-2 business days.  For prescription refills, please ask your pharmacy to contact our office. Our fax number is [phone].  If you have an urgent issue when the clinic is closed that cannot wait until the next business day, you can page your doctor at the number below.    Please note that while we do our best to be available for urgent issues outside of office hours, we are not available 24/7.   If you have an urgent issue and are unable to reach [place], you may choose to seek medical care at your doctor's office, retail clinic, urgent care center, or emergency room.  If you have a medical emergency, please immediately call 911 or go to the emergency department.  Pager  Numbers  - Dr. Nehemiah Massed: [phone]  - Dr. Laurence Ferrari: [phone]  - Dr. Nicole Kindred: [phone]  In the event of inclement weather, please call our main line at [phone] for an update on the status of any delays or closures.  Dermatology Medication Tips: Please keep the boxes that topical medications come in in order to help keep track of the instructions about where and how to use these. Pharmacies typically print the medication instructions only on the boxes and not directly on the medication tubes.   If your medication is too expensive, please contact our office at [phone] option 4 or send [place] a message through [place].   We are unable to tell what your co-pay for medications will be in advance as this is different depending on your insurance coverage. However, we may be able to find a substitute medication at lower cost or fill out paperwork to get insurance to cover a needed medication.   If a prior authorization is required to get your medication covered by your insurance company, please allow [place] 1-2 business days to complete this process.  Drug prices often vary depending on where the prescription is filled and some pharmacies may offer cheaper prices.  The website www.goodrx.com contains coupons for medications through different pharmacies. The prices here do not account for what the cost may be with help from insurance (it may be cheaper with your insurance), but the website can give you the price if you did not  use any insurance.  - You can print the associated coupon and take it with your prescription to the pharmacy.  - You may also stop by our office during regular business hours and pick up a GoodRx coupon card.  - If you need your prescription sent electronically to a different pharmacy, notify our office through [place] MyChart or by phone at [phone] option 4.  

## 2021-04-22 NOTE — Progress Notes (Signed)
   Follow-Up Visit   Subjective  Deborah Brandt is a 55 y.o. female who presents for the following: Skin Problem (Patient here today for a spot at forehead. Spot came up about 1 week ago and is sore and has gotten larger. Her left eye was swollen shut this am. Swelling, redness and pain has spread. ).  Patient saw Dr. Waunita Schooner on Monday and was given amoxicillin '875mg'$  to take twice daily for 7 days but patient said it has not made a difference yet.  They were not sure what was going on and told her if it got worse to see her dermatologist.   Patient is taking tylenol, ibuprofen for pain and Benadryl at bedtime.   The following portions of the chart were reviewed this encounter and updated as appropriate:   Tobacco  Allergies  Meds  Problems  Med Hx  Surg Hx  Fam Hx      Review of Systems:  No other skin or systemic complaints except as noted in HPI or Assessment and Plan.  Objective  Well appearing patient in no apparent distress; mood and affect are within normal limits.  A focused examination was performed including face, neck, scalp. Relevant physical exam findings are noted in the Assessment and Plan.  Head - Anterior (Face) Tender pink plaque at glabella, left forehead and superior brow Pink patches bilateral cheeks, left preauricular, left scalp Tender submandibular lymphadenopathy   Assessment & Plan  Rash Head - Anterior (Face)  Favor Erysipelas > cellulitis  No ocular symptoms, no swelling or erythema of eyelid.  Patient has failed amoxicillin and is allergic to sulfa, doxycycline and cephalexin.  Continue amoxicillin '875mg'$  twice daily as prescribed by PCP  Start clindamycin '450mg'$  po q6 hours x 7 days.  Discussed risk of C. Difficile. Recommend daily probiotic or yogurt for the next 3 months and if she develops diarrhea advised she should get checked for C. Difficile and advise her medical team she has had antibiotics including clindamycin.  Can take  ibuprofen 600 mg up to every 6-8 hours to help with pain and inflammation. Take with water and wait 15-30 minutes before lying down. Stop if you develop abdominal pain.  If you develop fever, chills, vision changes, swelling at the eyelid, continued spreading redness please go seek medical care at the Emergency Department as you may need more evaluation and IV antibiotics.   Typically we expect to see the infection stop worsening within 24 hours and get better within 48 hours of starting the correct antibiotic.   Recommend keeping follow-up with PCP Friday to recheck.    clindamycin (CLEOCIN) 150 MG capsule - Head - Anterior (Face) Take 3 capsules (450 mg total) by mouth every 6 (six) hours for 7 days.  Return for next available, TBSE.  Graciella Belton, RMA, am acting as scribe for Forest Gleason, MD .  Documentation: I have reviewed the above documentation for accuracy and completeness, and I agree with the above.  Forest Gleason, MD

## 2021-04-24 ENCOUNTER — Ambulatory Visit (INDEPENDENT_AMBULATORY_CARE_PROVIDER_SITE_OTHER): Payer: Managed Care, Other (non HMO) | Admitting: Family Medicine

## 2021-04-24 ENCOUNTER — Other Ambulatory Visit: Payer: Self-pay

## 2021-04-24 ENCOUNTER — Encounter: Payer: Self-pay | Admitting: Family Medicine

## 2021-04-24 DIAGNOSIS — R21 Rash and other nonspecific skin eruption: Secondary | ICD-10-CM

## 2021-04-24 MED ORDER — GABAPENTIN 100 MG PO CAPS: 100.0000 mg | ORAL_CAPSULE | Freq: Three times a day (TID) | ORAL | 2 refills | Status: DC

## 2021-04-24 MED ORDER — VALACYCLOVIR HCL 1 G PO TABS: 1000.0000 mg | ORAL_TABLET | Freq: Three times a day (TID) | ORAL | 0 refills | Status: DC

## 2021-04-24 NOTE — Progress Notes (Signed)
This visit occurred during the SARS-CoV-2 public health emergency.  Safety protocols were in place, including screening questions prior to the visit, additional usage of staff PPE, and extensive cleaning of exam room while observing appropriate contact time as indicated for disinfecting solutions.  Rash.  Recently seen, had derm f/u.  On amoxil and clinda in the meantime.  No help with abx.  New lesions in the meantime.  Burning pain.  L V 1 and scalp.  Small prickling near the eyebrow, then had lesions/redness.  No vision changes.  No eye pain.  L upper lid is puffy.  No discharge from the eye.  She has some LA in the neck.  No FCNAVD but she "doesn't feel great" taking clindamycin and exhausted from lack of sleep.  Sx started last week.    Meds, vitals, and allergies reviewed.   ROS: Per HPI unless specifically indicated in ROS section   Nad Ncat but maculopapular rash noted on the left V1 distribution, extending up into the scalp, clearly delineated and stopping at the midline.  She has associated paresthesia and hypersensitivity of skin in the same distribution.  Left V1 and V2 distribution normal inspection and sensation.  Right trigeminal inspection and sensation normal. Neck supple.  She has lymphadenopathy noted in the neck bilaterally. Rrr Ctab

## 2021-04-24 NOTE — Patient Instructions (Addendum)
Stop amoxil and clinda.  Start valtrex and gabapentin.  Likely shingles.   Please call the the clinic and let them know about your situation.   Take care.  Glad to see you.  [place] [phone]

## 2021-04-26 IMAGING — CT CT ABD-PELV W/ CM
1 of 3 series · 13 of 32 positions shown, 18 images · IV contrast (APPLIED)
Comparison: CT abdomen pelvis September 09, 2017

CLINICAL DATA: Seven days of pelvic and left lower quadrant pain,
history of diverticulitis

EXAM:
CT ABDOMEN AND PELVIS WITH CONTRAST
TECHNIQUE: Multidetector CT imaging of the abdomen and pelvis was performed
using the standard protocol following bolus administration of
intravenous contrast.
CONTRAST:  100mL OMNIPAQUE IOHEXOL 300 MG/ML  SOLN

[Series 2: axial st · axial · 0.85mm/px · z∈[-994,-574]mm · 13 of 96 slices shown, 18 images]
[im 6/96  soft-tissue]
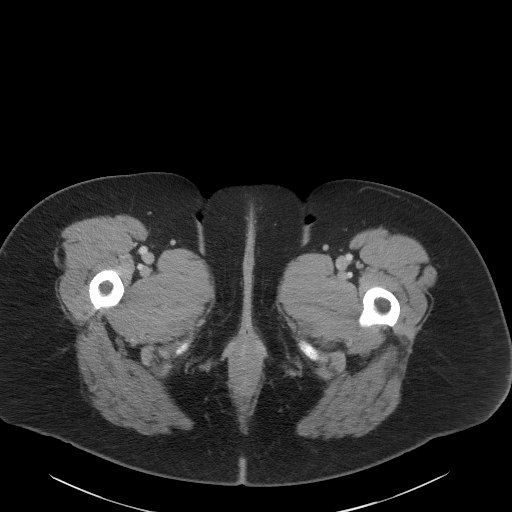
[im 6/96  bone]
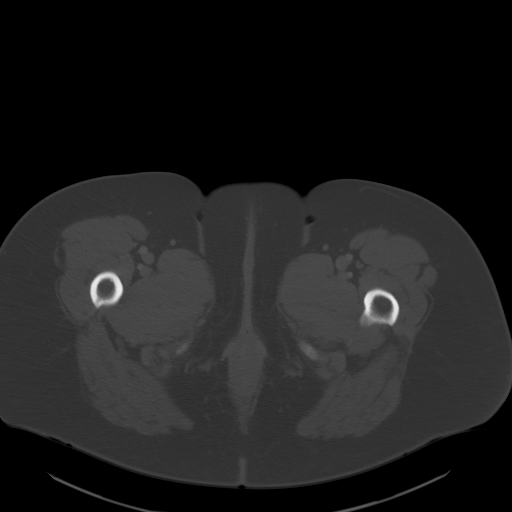
[im 16/96  soft-tissue]
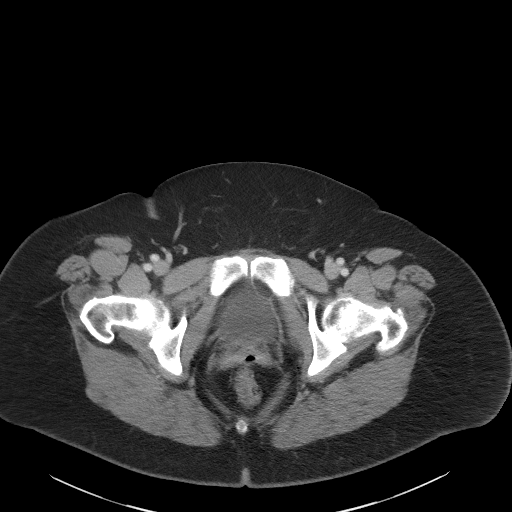
[im 22/96  soft-tissue]
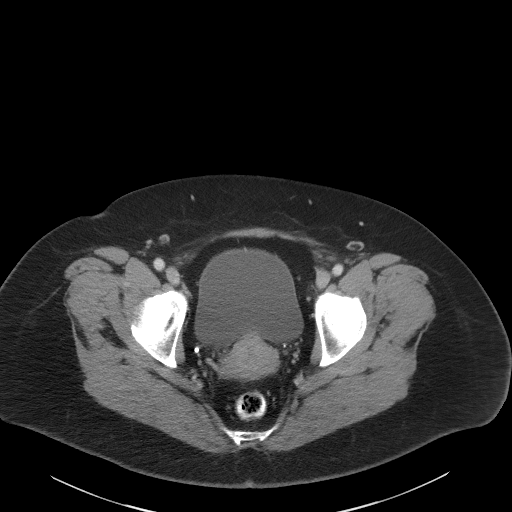
[im 27/96  soft-tissue]
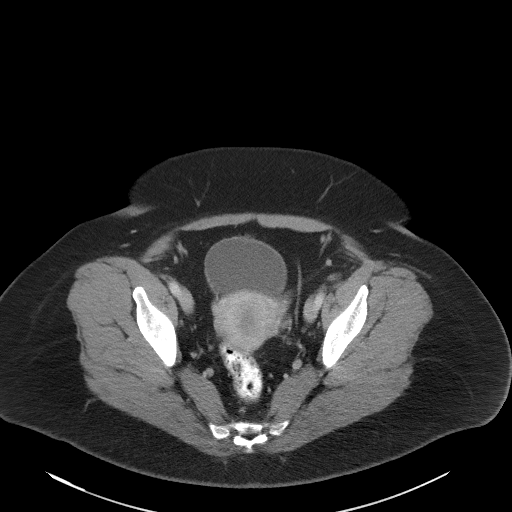
[im 37/96  soft-tissue]
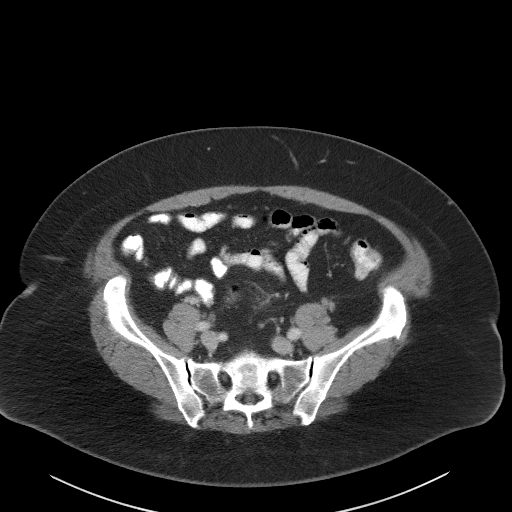
[im 43/96  soft-tissue]
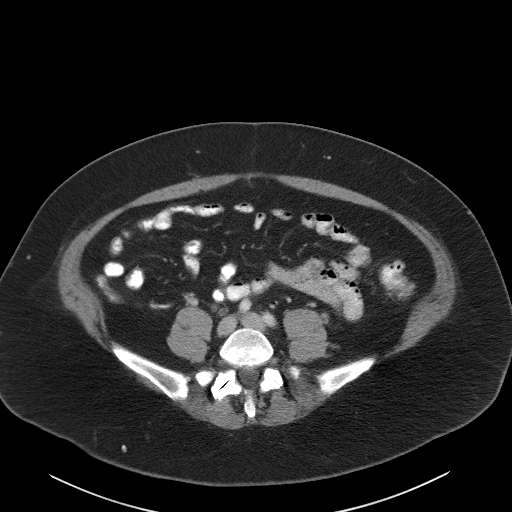
[im 53/96  soft-tissue]
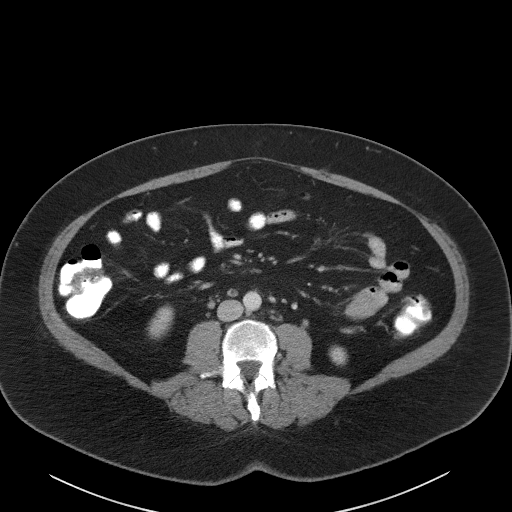
[im 59/96  soft-tissue]
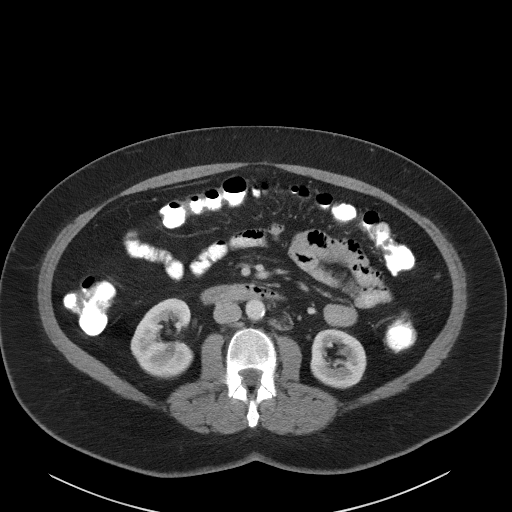
[im 69/96  soft-tissue]
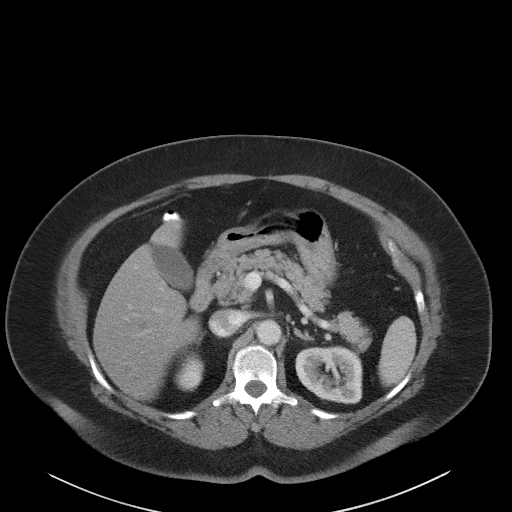
[im 69/96  bone]
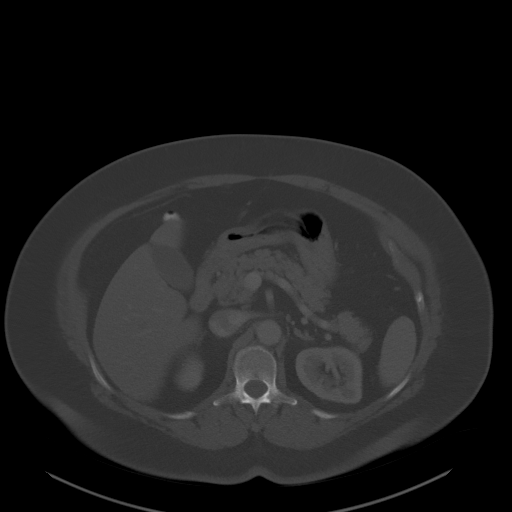
[im 74/96  soft-tissue]
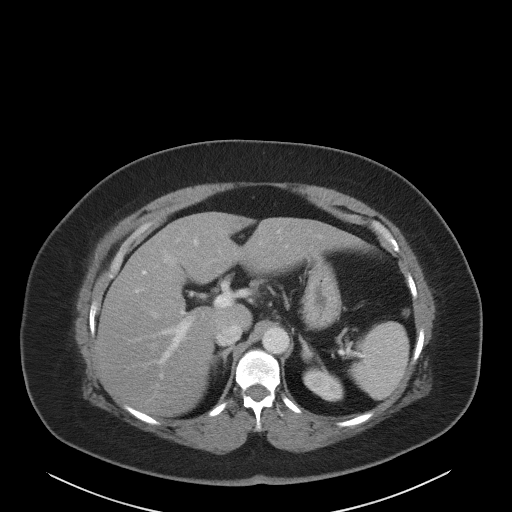
[im 74/96  lung]
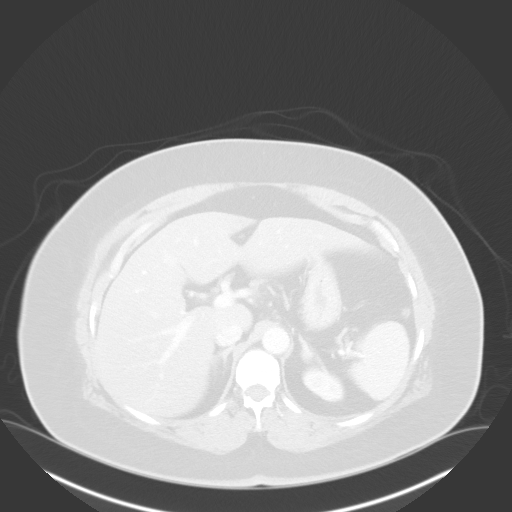
[im 80/96  soft-tissue]
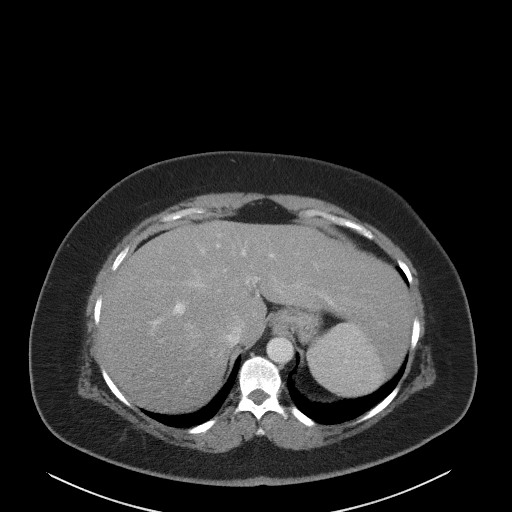
[im 80/96  lung]
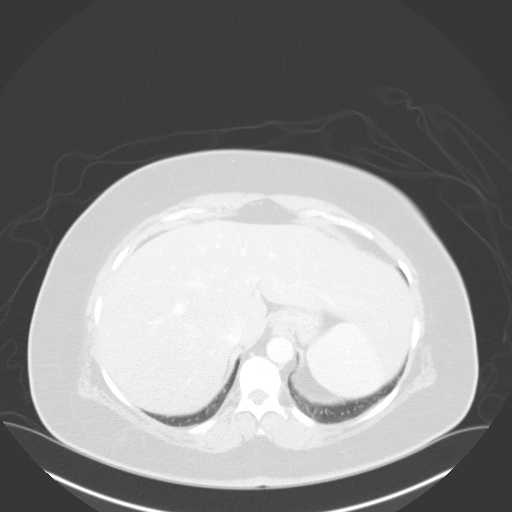
[im 85/96  lung]
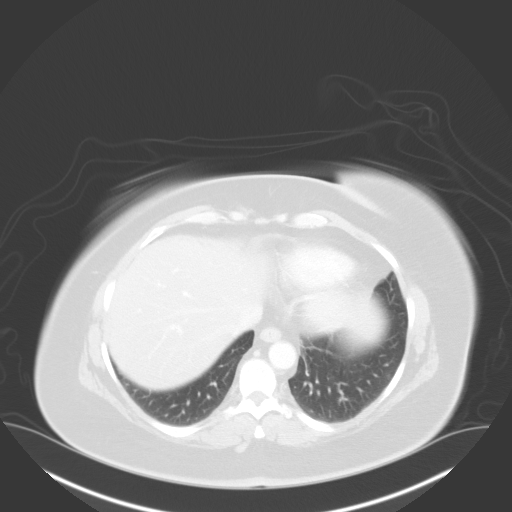
[im 90/96  soft-tissue]
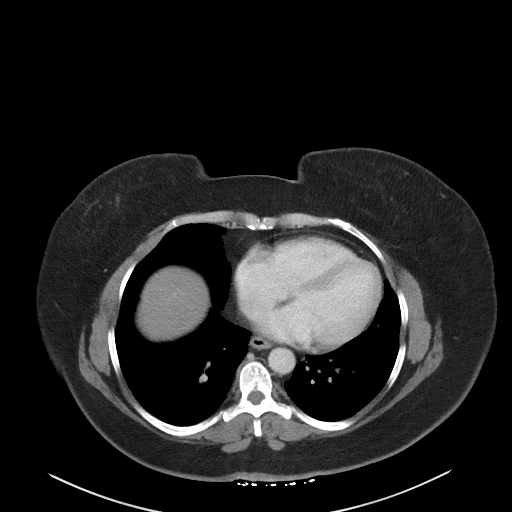
[im 90/96  lung]
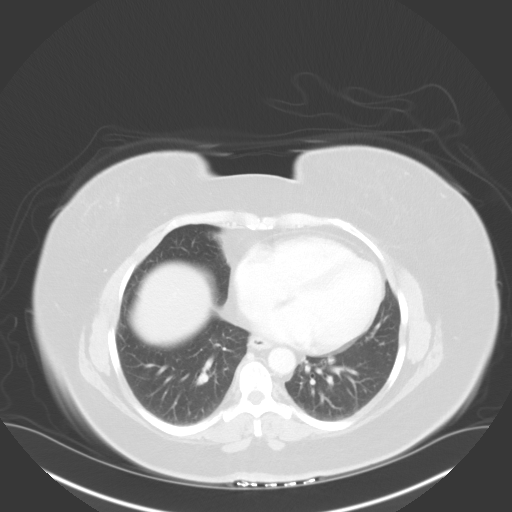

[13 of 32 positions shown; findings below may reference images not displayed]

FINDINGS: Lower chest: No acute abnormality.

Hepatobiliary: Diffuse hepatic steatosis. A few small hypodense
hepatic lesions for instance on image [DATE], which are technically
too small to accurately characterize favored represent a a benign
etiology such as cysts/hemangiomas. No suspicious solid hepatic
lesions. Gallbladder is grossly unremarkable. No biliary ductal
dilation.

Pancreas: Unremarkable. No pancreatic ductal dilatation or
surrounding inflammatory changes.

Spleen: Normal in size without focal abnormality.

Adrenals/Urinary Tract: Bilateral adrenal glands are unremarkable.

No hydronephrosis. Bilateral punctate nonobstructive renal stones.
No solid enhancing renal masses.

Urinary bladder is grossly unremarkable for degree of distension.

Stomach/Bowel: Sigmoid colonic diverticulosis with colonic wall
thickening in an inflamed diverticulum in the distal sigmoid colon
with adjacent pericolonic stranding. No pneumoperitoneum. No walled
off fluid collections.

Stomach is grossly unremarkable for degree of distension. No
suspicious small bowel wall thickening or dilation. Appendix and
terminal ileum are grossly unremarkable.

Vascular/Lymphatic: No significant vascular findings are present. No
pathologically enlarged abdominal or pelvic lymph nodes. Similar
prominent retroperitoneal lymph nodes for instance on image 49/2.

Reproductive: Uterus and bilateral adnexa are unremarkable.

Other: No abdominopelvic ascites.

Musculoskeletal: No acute or significant osseous findings.
IMPRESSION: 1. Acute uncomplicated distal sigmoid diverticulitis. No evidence of
perforation or abscess formation.
2. Bilateral nonobstructive nephrolithiasis.
3. Diffuse hepatic steatosis.

These results will be called to the ordering clinician or
representative by the Radiologist Assistant, and communication
documented in the PACS or [REDACTED].

## 2021-04-26 NOTE — Assessment & Plan Note (Signed)
Differential diagnosis discussed with patient.  Not improving on antibiotics.  Much more likely to be shingles at this point.  Discussed options.  Stop amoxil and clinda.  Start valtrex and gabapentin.  Rationale for use of both discussed with patient. Shingles pathophysiology discussed with patient. I called the eye clinic but I could not get through as they were closed for lunch.  I asked her to please call the the clinic when they reopened and let them know about her situation.   She agrees with plan.  She will update me as needed.  She does not have any vision changes.

## 2021-04-28 ENCOUNTER — Ambulatory Visit: Payer: Managed Care, Other (non HMO) | Admitting: Family Medicine

## 2021-05-01 ENCOUNTER — Encounter: Payer: Managed Care, Other (non HMO) | Admitting: Family Medicine

## 2021-05-04 ENCOUNTER — Ambulatory Visit: Payer: Managed Care, Other (non HMO) | Admitting: Surgery

## 2021-05-04 ENCOUNTER — Other Ambulatory Visit: Payer: Self-pay

## 2021-05-04 ENCOUNTER — Encounter: Payer: Self-pay | Admitting: Surgery

## 2021-05-04 VITALS — BP 145/91 | HR 80 | Temp 98.8°F | Ht 60.0 in | Wt 208.8 lb

## 2021-05-04 DIAGNOSIS — N6310 Unspecified lump in the right breast, unspecified quadrant: Secondary | ICD-10-CM | POA: Diagnosis not present

## 2021-05-04 NOTE — Progress Notes (Signed)
Outpatient Surgical Follow Up  05/04/2021  Deborah Brandt is an 55 y.o. female.   Chief Complaint  Patient presents with   Routine Post Op    2 mo mammogram f/u    HPI: Deborah Brandt is a 55 year old female well-known to me with a prior history of diverticulitis requiring sigmoid colectomy.  No complaints from that perspective.  She also had an asymmetry of the right breast.  I have personally reviewed the mammogram and ultrasound showing evidence of 8 o'clock cystic mass right breast.  This underwent biopsy showing evidence of benign fibrocystic changes, no dysplasia  Past Medical History:  Diagnosis Date   Complication of anesthesia    after laparoscopy(1988) BP dropped.  Low BP after epidurals for childbirth   Diverticulitis    Dysplastic nevus 02/21/2017   R lat pubic groin - mod   Dysplastic nevus 02/21/2017   L sup buttocks crease - mild    Dysplastic nevus 04/04/2017   RUQA - mod to severe 05/03/2017 excision   Dysplastic nevus 04/04/2017   R prox ant lat thigh - mild   Dysplastic nevus 07/18/2017   L side inf costal margin - mild    Dysplastic nevus 10/26/2017   R lat prox thigh near buttocks - mod   Dysplastic nevus 04/27/2018   L mid ant lat thigh - mod   Dysplastic nevus 07/26/2018   R ant med prox mid thigh - mod   Gestational diabetes 1990, 2001   Heart murmur    at birth. 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 - excision   Migraine    Motion sickness    boats, back seat of car   Nephrolithiasis    Pneumonia    Sensorineural hearing loss (SNHL) of left ear     Past Surgical History:  Procedure Laterality Date   BREAST BIOPSY Left 08/22/2017   neg, PREDOMINANTLY FATTY BREAST TISSUE   COLONOSCOPY  2004    COLONOSCOPY WITH PROPOFOL N/A 12/02/2017   Procedure: COLONOSCOPY WITH PROPOFOL;  Surgeon: WLucilla Lame MD;  [facility]  Service: Endoscopy;  Laterality: N/A;   DIAGNOSTIC LAPAROSCOPY  1988   checking for endometriosis but negative [id number]  POLYPECTOMY  12/02/2017   Procedure: POLYPECTOMY;  Surgeon: WLucilla Lame MD;  [place] [facility]  Service: Endoscopy;;   [place]   Family History  Problem Relation Age of Onset   Breast cancer Mother 782  Stroke Mother    Diabetes Father    Heart disease Father    Colon cancer Paternal Grandmother        dx'd at age 56  Diabetes Paternal Grandmother     Social History:  reports that she has never smoked. She has never used smokeless tobacco. She reports current alcohol use. She reports that she does not use drugs.  Allergies:  Allergies  Allergen Reactions   Ciprofloxacin Hives and Nausea  And Vomiting    During IV cipro at ER   Levaquin [Levofloxacin] Hives   Sulfa Antibiotics Hives   Augmentin [Amoxicillin-Pot Clavulanate] Hives and Itching   Codeine Nausea And Vomiting   Doxycycline Hives   Eucalyptus Oil Swelling    Eyes swell shut   Keflex [Cephalexin] Other (See Comments)    Swelling of face, burning sensation in face, face is red,swollen, skin is cracking on cheeks,nose and chin and bleeding.     Medications reviewed.    ROS Full ROS performed and is otherwise negative other than what is stated in HPI   BP (!) 145/91   Pulse 80   Temp 98.8 F (37.1 C) (Oral)   Ht 5' (1.524 m)   Wt 208 lb 12.8 oz (94.7 kg)   LMP 08/24/2015 (Approximate)   SpO2 95%   BMI 40.78 kg/m   Physical Exam Vitals and nursing note reviewed. Exam conducted with a chaperone  present.  Constitutional:      General: She is not in acute distress.    Appearance: Normal appearance. She is obese.  Eyes:     General: No scleral icterus.       Right eye: No discharge.        Left eye: No discharge.  Cardiovascular:     Rate and Rhythm: Normal rate and regular rhythm.     Heart sounds: No murmur heard. Pulmonary:     Effort: Pulmonary effort is normal. No respiratory distress.     Breath sounds: Normal breath sounds. No stridor. No wheezing or rhonchi.     Comments: BREAST: No palpable breast masses.  No evidence of lymphadenopathy in either axilla.  nORMAL skin and NAC both breasts Abdominal:     General: Abdomen is flat. There is no distension.     Palpations: Abdomen is soft. There is no mass.     Tenderness: There is no abdominal tenderness. There is no guarding or rebound.     Hernia: No hernia is present.  Musculoskeletal:     Cervical back: Normal range of motion and neck supple.  Skin:    General: Skin is warm and dry.     Capillary Refill: Capillary refill takes less than 2 seconds.     Comments: Residual Left face herpes zoster vesicles  Neurological:     General: No focal deficit present.     Mental Status: She is alert and oriented to person, place, and time.  Psychiatric:        Mood and Affect: Mood normal.        Behavior: Behavior normal.        Thought Content: Thought content normal.        Judgment: Judgment normal.        Assessment/Plan: Right breast asymmetry consistent with benign fibrocystic changes we will continue to follow her with yearly mammograms and physical exams.  No need for further interventions at this time.  Regarding her diverticulitis she is cured.  No abdominal issues at this time. Greater than 50% of the 30 minutes  visit was spent in counseling/coordination of care   Caroleen Hamman, MD [facility]

## 2021-05-04 NOTE — Patient Instructions (Addendum)
Please call our office if you have any questions or concerns. We will contact you in September of 2023 to schedule your mammogram and breast exam. If you do not hear from our office please call.

## 2021-05-21 ENCOUNTER — Other Ambulatory Visit: Payer: Self-pay

## 2021-05-21 ENCOUNTER — Encounter: Payer: Self-pay | Admitting: Dermatology

## 2021-05-21 ENCOUNTER — Ambulatory Visit (INDEPENDENT_AMBULATORY_CARE_PROVIDER_SITE_OTHER): Payer: Managed Care, Other (non HMO) | Admitting: Dermatology

## 2021-05-21 DIAGNOSIS — D2362 Other benign neoplasm of skin of left upper limb, including shoulder: Secondary | ICD-10-CM

## 2021-05-21 DIAGNOSIS — D229 Melanocytic nevi, unspecified: Secondary | ICD-10-CM

## 2021-05-21 DIAGNOSIS — D1800 Hemangioma unspecified site: Secondary | ICD-10-CM

## 2021-05-21 DIAGNOSIS — Z1283 Encounter for screening for malignant neoplasm of skin: Secondary | ICD-10-CM | POA: Diagnosis not present

## 2021-05-21 DIAGNOSIS — L578 Other skin changes due to chronic exposure to nonionizing radiation: Secondary | ICD-10-CM | POA: Diagnosis not present

## 2021-05-21 DIAGNOSIS — B0229 Other postherpetic nervous system involvement: Secondary | ICD-10-CM | POA: Diagnosis not present

## 2021-05-21 DIAGNOSIS — L814 Other melanin hyperpigmentation: Secondary | ICD-10-CM

## 2021-05-21 DIAGNOSIS — L918 Other hypertrophic disorders of the skin: Secondary | ICD-10-CM

## 2021-05-21 DIAGNOSIS — D239 Other benign neoplasm of skin, unspecified: Secondary | ICD-10-CM

## 2021-05-21 DIAGNOSIS — L821 Other seborrheic keratosis: Secondary | ICD-10-CM

## 2021-05-21 NOTE — Patient Instructions (Addendum)
Recommend OTC Gold Bond Rapid Relief Anti-Itch cream (pramoxine + menthol), CeraVe Anti-itch cream or lotion (pramoxine), or Sarna lotion (Original- menthol + camphor or Sensitive- pramoxine) up to 3 times per day to areas on body that are itchy.  Recommend Serica moisturizing scar formula cream every night or [facility] brand or Mederma silicone scar sheet every night for the first year after a scar appears to help with scar remodeling if desired. Scars remodel on their own for a full year.  Melanoma ABCDEs  Melanoma is the most dangerous type of skin cancer, and is the leading cause of death from skin disease.  You are more likely to develop melanoma if you: Have light-colored skin, light-colored eyes, or red or blond hair Spend a lot of time in the sun Tan regularly, either outdoors or in a tanning bed Have had blistering sunburns, especially during childhood Have a close family member who has had a melanoma Have atypical moles or large birthmarks  Early detection of melanoma is key since treatment is typically straightforward and cure rates are extremely high if we catch it early.   The first sign of melanoma is often a change in a mole or a new dark spot.  The ABCDE system is a way of remembering the signs of melanoma.  A for asymmetry:  The two halves do not match. B for border:  The edges of the growth are irregular. C for color:  A mixture of colors are present instead of an even brown color. D for diameter:  Melanomas are usually (but not always) greater than 55mm - the size of a pencil eraser. E for evolution:  The spot keeps changing in size, shape, and color.  Please check your skin once per month between visits. You can use a small mirror in front and a large mirror behind you to keep an eye on the back side or your body.   If you see any new or changing lesions before your next follow-up, please call to schedule a visit.  Please continue daily skin protection including broad  spectrum sunscreen SPF 30+ to sun-exposed areas, reapplying every 2 hours as needed when you're outdoors.    Recommend taking Heliocare sun protection supplement daily in sunny weather for additional sun protection. For maximum protection on the sunniest days, you can take up to 2 capsules of regular Heliocare OR take 1 capsule of Heliocare Ultra. For prolonged exposure (such as a full day in the sun), you can repeat your dose of the supplement 4 hours after your first dose. Heliocare can be purchased at [facility] or at [email or web address].   If you have any questions or concerns for your doctor, please call our main line at [phone] and press option 4 to reach your doctor's medical assistant. If no one answers, please leave a voicemail as directed and we will return your call as soon as possible. Messages left after 4 pm will be answered the following business day.   You may also send [place] a message via [place]. We typically respond to MyChart messages within 1-2 business days.  For prescription refills, please ask your pharmacy to contact our office. Our fax number is [phone].  If you have an urgent issue when the clinic is closed that cannot wait until the next business day, you can page your doctor at the number below.    Please note that while we do our best to be available for urgent issues outside of office hours, we are  not available 24/7.   If you have an urgent issue and are unable to reach [place], you may choose to seek medical care at your doctor's office, retail clinic, urgent care center, or emergency room.  If you have a medical emergency, please immediately call 911 or go to the emergency department.  Pager Numbers  - Dr. Nehemiah Massed: [phone]  - Dr. Laurence Ferrari: [phone]  - Dr. Nicole Kindred: [phone]  In the event of inclement weather, please call our main line at [phone] for an update on the status of any delays or closures.  Dermatology Medication  Tips: Please keep the boxes that topical medications come in in order to help keep track of the instructions about where and how to use these. Pharmacies typically print the medication instructions only on the boxes and not directly on the medication tubes.   If your medication is too expensive, please contact our office at [phone] option 4 or send [place] a message through [place].   We are unable to tell what your co-pay for medications will be in advance as this is different depending on your insurance coverage. However, we may be able to find a substitute medication at lower cost or fill out paperwork to get insurance to cover a needed medication.   If a prior authorization is required to get your medication covered by your insurance company, please allow [place] 1-2 business days to complete this process.  Drug prices often vary depending on where the prescription is filled and some pharmacies may offer cheaper prices.  The website www.goodrx.com contains coupons for medications through different pharmacies. The prices here do not account for what the cost may be with help from insurance (it may be cheaper with your insurance), but the website can give you the price if you did not use any insurance.  - You can print the associated coupon and take it with your prescription to the pharmacy.  - You may also stop by our office during regular business hours and pick up a GoodRx coupon card.  - If you need your prescription sent electronically to a different pharmacy, notify our office through [facility] or by phone at [phone] option 4.

## 2021-05-21 NOTE — Progress Notes (Signed)
Follow-Up Visit   Subjective  Deborah Brandt is a 55 y.o. female who presents for the following: FBSE (Patient here for full body skin exam and skin cancer screening. Patient with no hx of skin cancer or dysplastic nevi. She does have a few spots, asymptomatic at right thigh she would like checked. ).  We saw patient recently for rash at face. No evidence of shingles at time of visit but patient went to see PCP 2 days later and was diagnosed with shingles and given valtrex. Rash has improved for patient since starting valtrex.   The following portions of the chart were reviewed this encounter and updated as appropriate:   Tobacco  Allergies  Meds  Problems  Med Hx  Surg Hx  Fam Hx      Review of Systems:  No other skin or systemic complaints except as noted in HPI or Assessment and Plan.  Objective  Well appearing patient in no apparent distress; mood and affect are within normal limits.  A full examination was performed including scalp, head, eyes, ears, nose, lips, neck, chest, axillae, abdomen, back, buttocks, bilateral upper extremities, bilateral lower extremities, hands, feet, fingers, toes, fingernails, and toenails. All findings within normal limits unless otherwise noted below.  forehead Resolving cluster of red papules at left forehead, significantly different morphology and broader distribution than at previous visit   left posterior shoulder Firm pink/brown papulenodule with dimple sign.    Assessment & Plan  Post herpetic neuralgia forehead  With some scars at forehead  S/p shingles  Patient does not tolerate gabapentin. No vision changes since zoster episode.  Recommend OTC Gold Bond Rapid Relief Anti-Itch cream (pramoxine + menthol), CeraVe Anti-itch cream or lotion (pramoxine), or Sarna lotion (Original- menthol + camphor or Sensitive- pramoxine) up to 3 times per day to areas on body that are itchy.  Advised patient to consult with PCP on timing for  shingles vaccine. Start serica moisturizing scar formula twice a day to forehead    Dermatofibroma left posterior shoulder  Benign-appearing.  Observation.  Call clinic for new or changing lesions.   Lentigines - Scattered tan macules - Due to sun exposure - Benign-appearing, observe - Recommend daily broad spectrum sunscreen SPF 30+ to sun-exposed areas, reapply every 2 hours as needed. - Call for any changes  Seborrheic Keratoses - Stuck-on, waxy, tan-brown papules and/or plaques  - Benign-appearing - Discussed benign etiology and prognosis. - Observe - Call for any changes  Melanocytic Nevi - Tan-brown and/or pink-flesh-colored symmetric macules and papules - Benign appearing on exam today - Observation - Call clinic for new or changing moles - Recommend daily use of broad spectrum spf 30+ sunscreen to sun-exposed areas.   Hemangiomas - Red papules - Discussed benign nature - Observe - Call for any changes  Actinic Damage - Chronic condition, secondary to cumulative UV/sun exposure - diffuse scaly erythematous macules with underlying dyspigmentation - Recommend daily broad spectrum sunscreen SPF 30+ to sun-exposed areas, reapply every 2 hours as needed.  - Staying in the shade or wearing long sleeves, sun glasses (UVA+UVB protection) and wide brim hats (4-inch brim around the entire circumference of the hat) are also recommended for sun protection.  - Call for new or changing lesions.  Skin cancer screening performed today.  Acrochordons (Skin Tags) - Fleshy, skin-colored pedunculated papules - Benign appearing.  - Observe. - If desired, they can be removed with an in office procedure that is not covered by insurance. - Please call the  clinic if you notice any new or changing lesions.  Return if symptoms worsen or fail to improve.  Graciella Belton, RMA, am acting as scribe for Forest Gleason, MD .  Documentation: I have reviewed the above documentation for  accuracy and completeness, and I agree with the above.  Forest Gleason, MD

## 2021-05-26 ENCOUNTER — Encounter: Payer: Self-pay | Admitting: Family Medicine

## 2021-06-23 ENCOUNTER — Encounter: Payer: Self-pay | Admitting: Family Medicine

## 2021-07-29 ENCOUNTER — Encounter: Payer: Self-pay | Admitting: Family Medicine

## 2021-07-31 ENCOUNTER — Other Ambulatory Visit: Payer: Self-pay | Admitting: Family Medicine

## 2021-07-31 MED ORDER — ALBUTEROL SULFATE HFA 108 (90 BASE) MCG/ACT IN AERS: 2.0000 | INHALATION_SPRAY | Freq: Four times a day (QID) | RESPIRATORY_TRACT | 1 refills | Status: DC | PRN

## 2021-08-26 ENCOUNTER — Emergency Department
Admission: EM | Admit: 2021-08-26 | Discharge: 2021-08-26 | Disposition: A | Discharge status: Home / Self Care | Payer: 59 | Attending: Emergency Medicine | Admitting: Emergency Medicine

## 2021-08-26 ENCOUNTER — Emergency Department: Payer: 59

## 2021-08-26 ENCOUNTER — Other Ambulatory Visit: Payer: Self-pay

## 2021-08-26 ENCOUNTER — Telehealth: Payer: Self-pay

## 2021-08-26 DIAGNOSIS — R0602 Shortness of breath: Secondary | ICD-10-CM | POA: Diagnosis present

## 2021-08-26 DIAGNOSIS — J40 Bronchitis, not specified as acute or chronic: Secondary | ICD-10-CM | POA: Diagnosis not present

## 2021-08-26 LAB — BASIC METABOLIC PANEL
Anion gap: 8 (ref 5–15)
BUN: 12 mg/dL (ref 6–20)
CO2: 27 mmol/L (ref 22–32)
Calcium: 9.4 mg/dL (ref 8.9–10.3)
Chloride: 102 mmol/L (ref 98–111)
Creatinine, Ser: 0.9 mg/dL (ref 0.44–1.00)
GFR, Estimated: >60 mL/min (ref >60)
Glucose, Bld: 130 mg/dL — ABNORMAL HIGH (ref 70–99)
Potassium: 4 mmol/L (ref 3.5–5.1)
Sodium: 137 mmol/L (ref 135–145)

## 2021-08-26 LAB — CBC
HCT: 41.3 % (ref 36.0–46.0)
Hemoglobin: 13.9 g/dL (ref 12.0–15.0)
MCH: 31.4 pg (ref 26.0–34.0)
MCHC: 33.7 g/dL (ref 30.0–36.0)
MCV: 93.4 fL (ref 80.0–100.0)
Platelets: 223 10*3/uL (ref 150–400)
RBC: 4.42 MIL/uL (ref 3.87–5.11)
RDW: 12.6 % (ref 11.5–15.5)
WBC: 8.8 10*3/uL (ref 4.0–10.5)
nRBC: 0 % (ref 0.0–0.2)

## 2021-08-26 LAB — TROPONIN I (HIGH SENSITIVITY)
Troponin I (High Sensitivity): 3 ng/L (ref <18)
Troponin I (High Sensitivity): 3 ng/L (ref <18)

## 2021-08-26 MED ORDER — IPRATROPIUM-ALBUTEROL 0.5-2.5 (3) MG/3ML IN SOLN
3.0000 mL | Freq: Once | RESPIRATORY_TRACT | Status: AC
  Administered 2021-08-26: 3 mL via RESPIRATORY_TRACT
  Filled 2021-08-26: qty 3

## 2021-08-26 MED ORDER — AZITHROMYCIN 250 MG PO TABS: ORAL_TABLET | ORAL | 0 refills | Status: DC

## 2021-08-26 MED ORDER — ALBUTEROL SULFATE HFA 108 (90 BASE) MCG/ACT IN AERS: 2.0000 | INHALATION_SPRAY | Freq: Four times a day (QID) | RESPIRATORY_TRACT | 2 refills | Status: DC | PRN

## 2021-08-26 MED ORDER — IOHEXOL 350 MG/ML SOLN
100.0000 mL | Freq: Once | INTRAVENOUS | Status: AC | PRN
  Administered 2021-08-26: 100 mL via INTRAVENOUS
  Filled 2021-08-26: qty 100

## 2021-08-26 MED ORDER — BENZONATATE 100 MG PO CAPS: 100.0000 mg | ORAL_CAPSULE | Freq: Three times a day (TID) | ORAL | 0 refills | Status: AC | PRN

## 2021-08-26 MED ORDER — PREDNISONE 10 MG (21) PO TBPK: ORAL_TABLET | ORAL | 0 refills | Status: DC

## 2021-08-26 NOTE — Discharge Instructions (Addendum)
Take prednisone and azithromycin as directed.  You can take 2 Tessalon Perles at night before bed for cough.  You can take 2 puffs of Albuterol at night before bed.

## 2021-08-26 NOTE — ED Provider Notes (Signed)
[facility] Provider Note  Patient Contact: 9:27 PM (approximate)   History   Shortness of Breath   HPI  Deborah Brandt is a 56 y.o. female presents to the emergency department with shortness of breath at rest and with exertion since patient had COVID-19 in November.  Patient states that she has had cough since that time and has not taken any steroids or antibiotic yet.  She denies history of DVT or PE.  She states that she has some anterior chest wall soreness but no chest pain.  She denies daily smoking.  No recent travel or prolonged immobilization.      Physical Exam   Triage Vital Signs: ED Triage Vitals [08/26/21 1611]  Enc Vitals Group     BP (!) 149/81     Pulse Rate 93     Resp 20     Temp 98.2 F (36.8 C)     Temp Source Oral     SpO2 98 %     Weight 215 lb (97.5 kg)     Height 5' (1.524 m)     Head Circumference      Peak Flow      Pain Score 0     Pain Loc      Pain Edu?      Excl. in [place]?     Most recent vital signs: Vitals:   08/26/21 1611 08/26/21 1941  BP: (!) 149/81 133/78  Pulse: 93 87  Resp: 20 20  Temp: 98.2 F (36.8 C) 99 F (37.2 C)  SpO2: 98% 92%     General: Alert and in no acute distress. Eyes:  PERRL. EOMI. Head: No acute traumatic findings ENT:      Ears:       Nose: No congestion/rhinnorhea.      Mouth/Throat: Mucous membranes are moist. Neck: No stridor. No cervical spine tenderness to palpation. Hematological/Lymphatic/Immunilogical: No cervical lymphadenopathy. Cardiovascular:  Good peripheral perfusion Respiratory: Normal respiratory effort without tachypnea or retractions. Patient has expiratory wheezing bilaterally. Good air entry to the bases with no decreased or absent breath sounds. Gastrointestinal: Bowel sounds 4 quadrants. Soft and nontender to palpation. No guarding or rigidity. No palpable masses. No distention. No CVA tenderness. Musculoskeletal: Full range of motion to all extremities.   Neurologic:  No gross focal neurologic deficits are appreciated.  Skin:   No rash noted Other:   ED Results / Procedures / Treatments   Labs (all labs ordered are listed, but only abnormal results are displayed) Labs Reviewed  BASIC METABOLIC PANEL - Abnormal; Notable for the following components:      Result Value   Glucose, Bld 130 (*)    All other components within normal limits  CBC  POC URINE PREG, ED  TROPONIN I (HIGH SENSITIVITY)  TROPONIN I (HIGH SENSITIVITY)     EKG  EKG indicates normal sinus rhythm without ST segment elevation or other apparent arrhythmia.   RADIOLOGY  I personally viewed and evaluated these images as part of my medical decision making, as well as reviewing the written report by the radiologist.  ED Provider Interpretation: No consolidations, opacities or infiltrates to suggest pneumonia.  No evidence of PE on CTA.    MEDICATIONS ORDERED IN ED: Medications  ipratropium-albuterol (DUONEB) 0.5-2.5 (3) MG/3ML nebulizer solution 3 mL (3 mLs Nebulization Given 08/26/21 2138)  iohexol (OMNIPAQUE) 350 MG/ML injection 100 mL (100 mLs Intravenous Contrast Given 08/26/21 2230)     IMPRESSION / MDM / ASSESSMENT AND  PLAN / ED COURSE  I reviewed the triage vital signs and the nursing notes.                              Assessment and plan:  Shortness of breath:  Differential diagnosis includes, but is not limited to, post viral pneumonia, bronchitis, PE...  Vital signs are reassuring at triage.  On physical exam, patient seemed breathless.  Chest x-ray showed no consolidations, opacities or infiltrates.  CBC and BMP were reassuring.  Both sets of troponin were within reference range.  EKG indicated normal sinus rhythm without ST segment elevation or other apparent arrhythmia.  Will obtain CTA to rule out PE and will reassess.   CTA shows no evidence of PE.  Patient is allergic to penicillin and doxycycline, will cover patient for post viral  pneumonia with azithromycin and tapered prednisone.  Patient was also prescribed Tessalon Perles and albuterol inhaler.  Patient was given a DuoNeb while waiting in the emergency department.  Return precautions were given to return with new or worsening symptoms.  All patient questions were answered.  FINAL CLINICAL IMPRESSION(S) / ED DIAGNOSES   Final diagnoses:  Bronchitis     Rx / DC Orders   ED Discharge Orders          Ordered    azithromycin (ZITHROMAX) 250 MG tablet        08/26/21 2303    predniSONE (STERAPRED UNI-PAK 21 TAB) 10 MG (21) TBPK tablet        08/26/21 2303    albuterol (VENTOLIN HFA) 108 (90 Base) MCG/ACT inhaler  Every 6 hours PRN        08/26/21 2304    benzonatate (TESSALON PERLES) 100 MG capsule  3 times daily PRN        08/26/21 2304             Note:  This document was prepared using Dragon voice recognition software and may include unintentional dictation errors.   Vallarie Mare [place], PA-C 08/26/21 2316    Vladimir Crofts, MD 08/26/21 [phone]

## 2021-08-26 NOTE — Telephone Encounter (Signed)
Noted, will await ER report.  Thanks.

## 2021-08-26 NOTE — ED Triage Notes (Signed)
Pt to ED for shob since Monday.  States had covid in November and has had cough since.  Pt able to speak in complete sentences.  Reports chest tightness Dry cough noted in triage

## 2021-08-26 NOTE — ED Provider Triage Note (Signed)
Emergency Medicine Provider Triage Evaluation Note  Deborah Brandt, a 56 y.o. female  was evaluated in triage.  Pt complains of shortness of breath since Monday.  Patient was diagnosed with COVID back in November.  She has been using albuterol inhaler intermittently.  She denies any fevers, emesis, or syncope.  Review of Systems  Positive: Cough, SOB Negative: FCS, hemoptysis  Physical Exam  BP (!) 149/81    Pulse 93    Temp 98.2 F (36.8 C) (Oral)    Resp 20    Ht 5' (1.524 m)    Wt 97.5 kg    LMP 08/24/2015 (Approximate)    SpO2 98%    BMI 41.99 kg/m  Gen:   Awake, no distress   Resp:  Normal effort CTA, intermittent cough MSK:   Moves extremities without difficulty  Other:  CVS: RRR  Medical Decision Making  Medically screening exam initiated at 4:18 PM.  Appropriate orders placed.  Deborah Brandt was informed that the remainder of the evaluation will be completed by another provider, this initial triage assessment does not replace that evaluation, and the importance of remaining in the ED until their evaluation is complete.  Patient with intermittent nonproductive cough since Monday.  Patient reports associated shortness of breath.   Melvenia Needles, PA-C 08/26/21 1624

## 2021-08-26 NOTE — Telephone Encounter (Signed)
Per chart review tab pt is presently at [facility] ED. Sending note to DR Damita Dunnings who is out of office, Dr Darnell Level who is in office and Janett Billow CMA.

## 2021-08-26 NOTE — Telephone Encounter (Signed)
[facility] - Client TELEPHONE ADVICE RECORD [facility] Patient Name: Deborah Brandt [facility] Gender: Female DOB: October 12, 1965 Age: 56 Y 47 M 8 D Return Phone Number: [phone] (Primary) Address: City/ State/ Zip: [place]  [address] Client [facility] - Client Client Site [place] - Day Provider Renford Dills - MD Contact Type Call Who Is Calling Patient / Member / Family / Caregiver Call Type Triage / Clinical Relationship To Patient Self Return Phone Number [phone] (Primary) Chief Complaint BREATHING - shortness of breath or sounds breathless Reason for Call Symptomatic / Request for [place] states she is calling from Dr. Damita Dunnings office with a pt on her back line. Caller states she has covid a month ago but still having coughing issues and couldn't breath for the past 2 days. Translation No Nurse Assessment Nurse: [place], RN, Estill Bamberg Date/Time (Eastern Time): 08/26/2021 2:59:48 PM Confirm and document reason for call. If symptomatic, describe symptoms. ---caller states she had covid a month ago. it went away but she still had a cough. pcp called in an inhaler and it helped. she woke up monday with her chest tight and wheezing and cough. no fever Does the patient have any new or worsening symptoms? ---Yes Will a triage be completed? ---Yes Related visit to physician within the last 2 weeks? ---Yes Does the PT have any chronic conditions? (i.e. diabetes, asthma, this includes High risk factors for pregnancy, etc.) ---Yes List chronic conditions. ---heart murmur Is this a behavioral health or substance abuse call? ---No Guidelines Guideline Title Affirmed Question Affirmed Notes Nurse Date/Time (Eastern Time) COVID-19 - Persisting Symptoms Follow-up Call [1] MODERATE difficulty breathing (e.g., speaks in phrases, SOB even at rest, pulse  100-120) AND [2] new-onset or WORSE Humfleet, RN, Estill Bamberg 08/26/2021 3:01:11 PM PLEASE NOTE: All timestamps contained within this report are represented as [place] Standard Time. CONFIDENTIALTY NOTICE: This fax transmission is intended only for the addressee. It contains information that is legally privileged, confidential or otherwise protected from use or disclosure. If you are not the intended recipient, you are strictly prohibited from reviewing, disclosing, copying using or disseminating any of this information or taking any action in reliance on or regarding this information. If you have received this fax in error, please notify us immediately by telephone so that we can arrange for its return to [place]. Phone: [phone], Toll-Free: [phone], Fax: [phone] Page: 2 of 2 Call Id: [id number] [place]. Time Eilene Ghazi Time) Disposition Final User 08/26/2021 2:55:37 PM Send to Urgent Queue Cathlean Sauer 08/26/2021 3:02:04 PM Go to ED Now Yes Humfleet, RN, Shelly Coss Disagree/Comply Comply Caller Understands Yes PreDisposition InappropriateToAsk Care Advice Given Per Guideline GO TO ED NOW: * You need to be seen in the Emergency Department. * Go to the ED at ___________ [place] now. Drive carefully. CARE ADVICE given per COVID-19 - Persisting Symptoms Follow-Up Call (Adult) guideline. ANOTHER ADULT SHOULD DRIVE: * It is better and safer if another adult drives instead of you

## 2021-11-06 ENCOUNTER — Encounter: Payer: Self-pay | Admitting: Family Medicine

## 2021-11-06 NOTE — Telephone Encounter (Signed)
I spoke with pt;symptoms started on 11/04/21 with wet cough but nothing comes up. Pt has not taken covid test yet. Pt said her cough is a barking cough and at night wheezing is so bad that it sounds like kittens in her chest. Pt hears rattling in chest during the day.pt has not taken temp but feels warm and pt taking ibuprofen 400 mg q6h. No CP or SOB. Pt denies head congestion or runny nose, no H/A or body aches; No vomiting or diarrhea. Pt does have scratchy throat. Inhaler helps slightly. No available appts at [facility] this afternoon; offered pt appt at [facility] on [address] at 5:30 today but pt does not want to go to [facility] and pt will go to [place] when she gets off work at [employer] today. Spoke with Dr Damita Dunnings and sending note to Dr Damita Dunnings. ?

## 2021-11-08 ENCOUNTER — Telehealth: Payer: Self-pay | Admitting: Family Medicine

## 2021-11-08 NOTE — Telephone Encounter (Signed)
Please get update on patient about her cough.  Thanks. ?

## 2021-11-08 NOTE — Telephone Encounter (Signed)
Noted.  See following phone note. ?

## 2021-11-09 ENCOUNTER — Ambulatory Visit
Admission: EM | Admit: 2021-11-09 | Discharge: 2021-11-09 | Disposition: A | Discharge status: Home / Self Care | Payer: 59 | Attending: Emergency Medicine | Admitting: Emergency Medicine

## 2021-11-09 ENCOUNTER — Other Ambulatory Visit: Payer: Self-pay

## 2021-11-09 ENCOUNTER — Encounter: Payer: Self-pay | Admitting: Emergency Medicine

## 2021-11-09 DIAGNOSIS — J209 Acute bronchitis, unspecified: Secondary | ICD-10-CM

## 2021-11-09 DIAGNOSIS — Z1152 Encounter for screening for COVID-19: Secondary | ICD-10-CM

## 2021-11-09 MED ORDER — AZITHROMYCIN 250 MG PO TABS
250.0000 mg | ORAL_TABLET | Freq: Every day | ORAL | 0 refills | Status: DC
Start: 2021-11-09 — End: 2021-11-19

## 2021-11-09 MED ORDER — PREDNISONE 10 MG (21) PO TBPK: ORAL_TABLET | Freq: Every day | ORAL | 0 refills | Status: DC

## 2021-11-09 NOTE — ED Triage Notes (Signed)
Pt presents with cough, chest congestion, chest pressure, SOB, and wheezing x 1 week.  ?

## 2021-11-09 NOTE — ED Provider Notes (Signed)
?[facility] ? ? ? ?CSN: [id number] ?Arrival date & time: 11/09/21  6948 ? ? ?  ? ?History   ?Chief Complaint ?Chief Complaint  ?Patient presents with  ? Cough  ? Wheezing  ? ? ?HPI ?Deborah Brandt is a 56 y.o. female.   Patient presents with 1 week history of nonproductive cough, chest congestion, wheezing, shortness of breath.  She states this is similar to previous episode of bronchitis.  No fever, chills, or other symptoms.  Treatment at home with albuterol inhaler.  Her medical history includes hyperlipidemia, migraine headaches, kidney stones, diverticulitis, obesity. ? ? ?The history is provided by the patient and medical records.  ? ?Past Medical History:  ?Diagnosis Date  ? Complication of anesthesia   ? after laparoscopy(1988) BP dropped.  Low BP after epidurals for childbirth  ? Diverticulitis   ? Gestational diabetes 1990, 2001  ? Heart murmur   ? at birth. no recent mention  ? Hepatic steatosis   ? History of kidney stones   ? HLD (hyperlipidemia)   ? Migraine   ? Motion sickness   ? boats, back seat of car  ? Nephrolithiasis   ? Pneumonia   ? Sensorineural hearing loss (SNHL) of left ear   ? ? ?Patient Active Problem List  ? Diagnosis Date Noted  ? S/P laparoscopic-assisted sigmoidectomy 12/23/2020  ? Heart murmur 11/24/2020  ? Hyperglycemia 04/14/2020  ? Hearing loss 08/08/2019  ? Thumb pain 07/26/2018  ? Rash 07/26/2018  ? Cough 07/26/2018  ? ASCUS of cervix with negative high risk HPV 04/02/2018  ? Health care maintenance 04/02/2018  ? Nasal obstruction 04/02/2018  ? Toe pain 04/02/2018  ? Obesity, morbid, BMI 40.0-49.9 ([place]) 04/02/2018  ? HLD (hyperlipidemia) 04/02/2018  ? Special screening for malignant neoplasms, colon   ? Rectal polyp   ? Drug-induced urticaria 09/20/2017  ? Routine general medical examination at a health care facility 02/08/2017  ? Abdominal pain, LLQ 01/21/2017  ? History of migraine 11/24/2016  ? Renal stones 11/24/2016  ? Diverticulitis 11/24/2016  ? Menopause  11/24/2016  ? Advance care planning 11/24/2016  ? ? ?Past Surgical History:  ?Procedure Laterality Date  ? BREAST BIOPSY Left 08/22/2017  ? neg, PREDOMINANTLY FATTY BREAST TISSUE  ? COLONOSCOPY  2004  ? COLONOSCOPY WITH PROPOFOL N/A 12/02/2017  ? Procedure: COLONOSCOPY WITH PROPOFOL;  Surgeon: Lucilla Lame, MD;  Location: [facility];  Service: Endoscopy;  Laterality: N/A;  ? DIAGNOSTIC LAPAROSCOPY  1988  ? checking for endometriosis but negative [place], [place]  ? [place] x2  ? [place], [place]  ? POLYPECTOMY  12/02/2017  ? Procedure: POLYPECTOMY;  Surgeon: Lucilla Lame, MD;  Location: [facility];  Service: Endoscopy;;  ? SINUS EXPLORATION  1998  ? [place], [place]   ? TUBAL LIGATION    ? VAGINAL DELIVERY  1990  ? [place], [place]  ? ? ?OB History   ? ? Gravida  ?3  ? Para  ?   ? Term  ?   ? Preterm  ?   ? AB  ?1  ? Living  ?2  ?  ? ? SAB  ?   ? IAB  ?   ? Ectopic  ?   ? Multiple  ?   ? Live Births  ?   ?   ?  ?  ? ? ? ?Home Medications   ? ?Prior to Admission medications   ?  Medication Sig Start Date End Date Taking? Authorizing Provider  ?azithromycin (ZITHROMAX) 250 MG tablet Take 1 tablet (250 mg total) by mouth daily. Take first 2 tablets together, then 1 every day until finished. 11/09/21  Yes Sharion Balloon, NP  ?predniSONE (STERAPRED UNI-PAK 21 TAB) 10 MG (21) TBPK tablet Take by mouth daily. As directed 11/09/21  Yes Sharion Balloon, NP  ?acetaminophen (TYLENOL) 500 MG tablet Take 1,000 mg by mouth every 6 (six) hours as needed for mild pain or moderate pain.    [provider]  ?albuterol (VENTOLIN HFA) 108 (90 Base) MCG/ACT inhaler Inhale 2 puffs into the lungs every 6 (six) hours as needed for wheezing or shortness of breath. 08/26/21   Lannie Fields, PA-C  ?diphenhydrAMINE (BENADRYL) 25 MG tablet Take 25 mg by mouth every 6 (six) hours as needed.    [provider]  ?ibuprofen (ADVIL)  400 MG tablet Take 400 mg by mouth every 6 (six) hours as needed.    [provider]  ? ? ?Family History ?Family History  ?Problem Relation Age of Onset  ? Breast cancer Mother 67  ? Stroke Mother   ? Diabetes Father   ? Heart disease Father   ? Colon cancer Paternal Grandmother   ?     dx'd at age 54  ? Diabetes Paternal Grandmother   ? ? ?Social History ?Social History  ? ?Tobacco Use  ? Smoking status: Never  ? Smokeless tobacco: Never  ?Vaping Use  ? Vaping Use: Never used  ?Substance Use Topics  ? Alcohol use: Yes  ?  Comment: rarely  ? Drug use: No  ? ? ? ?Allergies   ?Ciprofloxacin, Levaquin [levofloxacin], Sulfa antibiotics, Augmentin [amoxicillin-pot clavulanate], Codeine, Doxycycline, Eucalyptus oil, and Keflex [cephalexin] ? ? ?Review of Systems ?Review of Systems  ?Constitutional:  Negative for chills and fever.  ?HENT:  Negative for ear pain and sore throat.   ?Respiratory:  Positive for cough, shortness of breath and wheezing.   ?Cardiovascular:  Negative for chest pain and palpitations.  ?Gastrointestinal:  Negative for diarrhea and vomiting.  ?Skin:  Negative for color change and rash.  ?All other systems reviewed and are negative. ? ? ?Physical Exam ?Triage Vital Signs ?ED Triage Vitals  ?Enc Vitals Group  ?   BP   ?   Pulse   ?   Resp   ?   Temp   ?   Temp src   ?   SpO2   ?   Weight   ?   Height   ?   Head Circumference   ?   Peak Flow   ?   Pain Score   ?   Pain Loc   ?   Pain Edu?   ?   Excl. in [place]?   ? ?No data found. ? ?Updated Vital Signs ?BP (!) 146/89   Pulse 100   Temp 98.8 ?F (37.1 ?C)   Resp 20   LMP 08/24/2015 (Approximate)   SpO2 96%  ? ?Visual Acuity ?Right Eye Distance:   ?Left Eye Distance:   ?Bilateral Distance:   ? ?Right Eye Near:   ?Left Eye Near:    ?Bilateral Near:    ? ?Physical Exam ?Vitals and nursing note reviewed.  ?Constitutional:   ?   General: She is not in acute distress. ?   Appearance: She is well-developed. She is obese. She is not ill-appearing.   ?HENT:  ?   Right Ear:  Tympanic membrane normal.  ?   Left Ear: Tympanic membrane normal.  ?   Nose: Rhinorrhea present.  ?   Mouth/Throat:  ?   Mouth: Mucous membranes are moist.  ?   Pharynx: Oropharynx is clear.  ?Cardiovascular:  ?   Rate and Rhythm: Normal rate and regular rhythm.  ?   Heart sounds: Normal heart sounds.  ?Pulmonary:  ?   Effort: Pulmonary effort is normal. No respiratory distress.  ?   Breath sounds: Normal breath sounds. No wheezing.  ?Musculoskeletal:  ?   Cervical back: Neck supple.  ?Skin: ?   General: Skin is warm and dry.  ?Neurological:  ?   Mental Status: She is alert.  ?Psychiatric:     ?   Mood and Affect: Mood normal.     ?   Behavior: Behavior normal.  ? ? ? ?UC Treatments / Results  ?Labs ?(all labs ordered are listed, but only abnormal results are displayed) ?Labs Reviewed  ?NOVEL CORONAVIRUS, NAA  ? ? ?EKG ? ? ?Radiology ?No results found. ? ?Procedures ?Procedures (including critical care time) ? ?Medications Ordered in UC ?Medications - No data to display ? ?Initial Impression / Assessment and Plan / UC Course  ?I have reviewed the triage vital signs and the nursing notes. ? ?Pertinent labs & imaging results that were available during my care of the patient were reviewed by me and considered in my medical decision making (see chart for details). ? ? Acute bronchitis.  COVID pending.  Instructed patient to self quarantine per CDC guidelines.  Treating with Zithromax, prednisone, albuterol inhaler.  Discussed symptomatic treatment including Tylenol or ibuprofen, rest, hydration.  Instructed patient to follow up with PCP if symptoms are not improving.  Patient agrees to plan of care. ? ? ? ?Final Clinical Impressions(s) / UC Diagnoses  ? ?Final diagnoses:  ?Encounter for screening for COVID-19  ?Acute bronchitis, unspecified organism  ? ? ? ?Discharge Instructions   ? ?  ?Continue using the albuterol inhaler.  Take the Zithromax and prednisone as directed.  Follow up with your  primary care provider.   ? ? ? ? ? ?ED Prescriptions   ? ? Medication Sig Dispense Auth. Provider  ? azithromycin (ZITHROMAX) 250 MG tablet Take 1 tablet (250 mg total) by mouth daily. Take first 2 tablets together, the

## 2021-11-09 NOTE — Telephone Encounter (Signed)
Called patient and she went to UC this morning. Patient states they gave her a zpack and prednisone to take; hasn't started yet but will once she picks them up. Patient states cough is no better. UC suggested she may need a pulmonary referral done and some testing with how many different times she has been sick in the past few months.  ?

## 2021-11-09 NOTE — Discharge Instructions (Addendum)
Continue using the albuterol inhaler.  Take the Zithromax and prednisone as directed.  Follow up with your primary care provider.   ? ?

## 2021-11-10 LAB — NOVEL CORONAVIRUS, NAA: SARS-CoV-2, NAA: NOT DETECTED

## 2021-11-10 NOTE — Telephone Encounter (Signed)
Called and spoke with patient and she wanted to go ahead and make an appt for 11/19/21 at 10:00 am to discuss everything with Dr. Damita Dunnings. Appt has been made.  ?

## 2021-11-10 NOTE — Telephone Encounter (Signed)
I think it makes sense to proceed with the medicines/treatment given in urgent care for now.  If she is not improving then let me know.  I do not know if it makes sense to refer her to pulmonary right now since it may not change the plan in the very near future.  However if she wants to see pulmonary after she is feeling better from this episode, then let me know and I can put in the referral.  It might make more sense to see them at that point.  Thanks. ?

## 2021-11-19 ENCOUNTER — Ambulatory Visit (INDEPENDENT_AMBULATORY_CARE_PROVIDER_SITE_OTHER)
Admission: RE | Admit: 2021-11-19 | Discharge: 2021-11-19 | Disposition: A | Discharge status: Home / Self Care | Payer: 59 | Source: Ambulatory Visit | Attending: Family Medicine | Admitting: Family Medicine

## 2021-11-19 ENCOUNTER — Encounter: Payer: Self-pay | Admitting: Family Medicine

## 2021-11-19 ENCOUNTER — Ambulatory Visit: Payer: 59 | Admitting: Family Medicine

## 2021-11-19 VITALS — BP 122/70 | HR 100 | Temp 97.3°F | Ht 60.0 in | Wt 216.0 lb

## 2021-11-19 DIAGNOSIS — R059 Cough, unspecified: Secondary | ICD-10-CM | POA: Diagnosis not present

## 2021-11-19 MED ORDER — FLUTICASONE PROPIONATE HFA 110 MCG/ACT IN AERO: 1.0000 | INHALATION_SPRAY | Freq: Two times a day (BID) | RESPIRATORY_TRACT | 12 refills | Status: DC

## 2021-11-19 NOTE — Patient Instructions (Addendum)
Go to the lab on the way out.   If you have mychart we'll likely use that to update you.    ?Take care.  Glad to see you. ?Use albuterol if needed.  In the meantime, start flovent.  If it is really expensive, let me know.  Rinse after use.  ?

## 2021-11-19 NOTE — Progress Notes (Signed)
Still fatigued.  Cough is occ now, episodic barking cough.  She is clearly better than 1 week ago.  Recent UC note d/w pt.  Covid neg at that point.  She was treated with zmax and prednisone.  Mucinex seemed to help, unclear if there was a lag time prior to prednisone effect.  No fevers now.  No sputum now.  Sleeping better.  Using SABA prn, less now, not in a few days.  She has a wheeze at night, only noted since recent illness/since she had covid.   ? ?She had an episode in 10/2021, 08/2021, and with covid in 06/2021.   ? ?She didn't smoke but had sig 2nd hand exposure in childhood.  She is having to use aftrin about 2x/week.  She is trying to limit use, d/w pt.   ? ?Meds, vitals, and allergies reviewed.  ? ?ROS: Per HPI unless specifically indicated in ROS section  ? ?Nad ?Ncat ?Neck supple no LA ?Ctab ?Rrr ?Dry cough noted.  ?Abdomen soft.  Nontender ?Extremities well perfused. ? ?Chest x-ray pending.  See notes on imaging. ?

## 2021-11-22 NOTE — Assessment & Plan Note (Signed)
Discussed options.  She could have a postinfectious cough.  She has significant secondhand smoke exposure from childhood.  She can use albuterol as needed in the meantime.  Prescription sent for Flonvent----> we can refer to pulmonary if not better.  Routine albuterol and Flovent cautions discussed with patient.  Advised to rinse after use of Flovent.  She agrees with plan.  See after visit summary. ?

## 2022-01-25 ENCOUNTER — Other Ambulatory Visit: Payer: Self-pay | Admitting: Family Medicine

## 2022-01-25 DIAGNOSIS — Z1231 Encounter for screening mammogram for malignant neoplasm of breast: Secondary | ICD-10-CM

## 2022-03-04 ENCOUNTER — Ambulatory Visit
Admission: RE | Admit: 2022-03-04 | Discharge: 2022-03-04 | Disposition: A | Discharge status: Home / Self Care | Payer: 59 | Source: Ambulatory Visit | Attending: Family Medicine | Admitting: Family Medicine

## 2022-03-04 DIAGNOSIS — Z1231 Encounter for screening mammogram for malignant neoplasm of breast: Secondary | ICD-10-CM | POA: Diagnosis present

## 2022-04-12 ENCOUNTER — Ambulatory Visit: Payer: 59 | Admitting: Surgery

## 2022-05-03 ENCOUNTER — Ambulatory Visit (INDEPENDENT_AMBULATORY_CARE_PROVIDER_SITE_OTHER): Payer: 59 | Admitting: Surgery

## 2022-05-03 ENCOUNTER — Encounter: Payer: Self-pay | Admitting: Surgery

## 2022-05-03 VITALS — BP 133/89 | HR 73 | Temp 98.0°F | Ht 60.0 in | Wt 215.0 lb

## 2022-05-03 DIAGNOSIS — N6011 Diffuse cystic mastopathy of right breast: Secondary | ICD-10-CM

## 2022-05-03 DIAGNOSIS — N6019 Diffuse cystic mastopathy of unspecified breast: Secondary | ICD-10-CM

## 2022-05-03 NOTE — Patient Instructions (Addendum)
Patient will be asked to return to the office in one year with a bilateral screening mammogram.   We will send you a letter about these appointments.

## 2022-05-04 ENCOUNTER — Encounter: Payer: Self-pay | Admitting: Surgery

## 2022-05-04 NOTE — Progress Notes (Signed)
Outpatient Surgical Follow Up  05/04/2022  Deborah Brandt is an 56 y.o. female.   Chief Complaint  Patient presents with   Follow-up    HPI: Deborah Brandt is a 56 year old female well-known to me with a prior history of diverticulitis requiring sigmoid colectomy.  No complaints from that perspective. She also had a cystic lesion showing evidence of 8 o'clock cystic mass right breast and underwent biopsy last year w benign changes, no dysplasia.  She denies any breast tenderness, no pain, no masses no nipple discharges.  She does self examinations without any issues. She did have recent mammogram that I personally reviewed showing no evidence of any suspicious lesions    Past Medical History:  Diagnosis Date   Complication of anesthesia    after laparoscopy(1988) BP dropped.  Low BP after epidurals for childbirth   Diverticulitis    Gestational diabetes 1990, 2001   Heart murmur    at birth. no recent mention   Hepatic steatosis    History of kidney stones    HLD (hyperlipidemia)    Migraine    Motion sickness    boats, back seat of car   Nephrolithiasis    Pneumonia    Sensorineural hearing loss (SNHL) of left ear     Past Surgical History:  Procedure Laterality Date   BREAST BIOPSY Left 08/22/2017   neg, PREDOMINANTLY FATTY BREAST TISSUE   COLONOSCOPY  2004   COLONOSCOPY WITH PROPOFOL N/A 12/02/2017   Procedure: COLONOSCOPY WITH PROPOFOL;  Surgeon: Lucilla Lame, MD;  Location: [facility];  Service: Endoscopy;  Laterality: N/A;   DIAGNOSTIC LAPAROSCOPY  1988   checking for endometriosis but negative [place] x2   [place], [place]   POLYPECTOMY  12/02/2017   Procedure: POLYPECTOMY;  Surgeon: Lucilla Lame, MD;  Location: [facility];  Service: Endoscopy;;   [place], [place], [place]    Family History  Problem Relation Age of Onset   Breast cancer Mother 71   Stroke Mother    Diabetes Father    Heart disease Father    Colon cancer Paternal Grandmother        dx'd at age 21   Diabetes Paternal Grandmother     Social History:  reports that she has never smoked. She has never been exposed to tobacco smoke. She has never used smokeless tobacco. She reports current alcohol use. She reports that she does not use drugs.  Allergies:  Allergies  Allergen Reactions   Ciprofloxacin Hives and Nausea And Vomiting    During IV cipro at ER   Levaquin [Levofloxacin] Hives   Sulfa Antibiotics Hives   Augmentin [Amoxicillin-Pot Clavulanate] Hives and Itching   Codeine Nausea And Vomiting   Doxycycline Hives   Eucalyptus Oil Swelling    Eyes swell shut   Keflex [Cephalexin] Other (See Comments)    Swelling of face, burning sensation in face, face is red,swollen, skin is cracking on cheeks,nose and chin and bleeding.     Medications reviewed.    ROS Full ROS performed and is otherwise negative other than what is stated in HPI   BP 133/89   Pulse 73   Temp 98 F (36.7 C)   Ht 5' (1.524 m)   Wt 215 lb (97.5 kg)   LMP 08/24/2015 (  Approximate)   SpO2 97%   BMI 41.99 kg/m   Physical Exam Vitals and nursing note reviewed. Exam conducted with a chaperone present.  Constitutional:      General: She is not in acute distress.    Appearance: Normal appearance.  Cardiovascular:     Rate and Rhythm: Normal rate and regular rhythm.  Pulmonary:     Effort: Pulmonary effort is normal. No respiratory distress.     Breath sounds: Normal breath sounds. No stridor. No rhonchi.     Comments: BREAST: No evidence of palpable lesions.  No skin changes.  No evidence of any abnormalities on either breast.  No evidence of axillary lymphadenopathy Abdominal:     General: Abdomen is flat. There is no distension.     Palpations: Abdomen is soft. There is no mass.      Tenderness: There is no abdominal tenderness. There is no guarding or rebound.     Hernia: No hernia is present.  Musculoskeletal:        General: Normal range of motion.     Cervical back: Normal range of motion and neck supple. No rigidity or tenderness.  Lymphadenopathy:     Cervical: No cervical adenopathy.  Skin:    General: Skin is warm and dry.     Capillary Refill: Capillary refill takes less than 2 seconds.  Neurological:     General: No focal deficit present.     Mental Status: She is alert and oriented to person, place, and time.  Psychiatric:        Mood and Affect: Mood normal.        Behavior: Behavior normal.        Thought Content: Thought content normal.        Judgment: Judgment normal.     Assessment/Plan: 56 year old female with prior breast cyst without evidence of malignancy.  Current physical exam is benign without any suspicious lesions and consistent with recent mammogram.  Recommend yearly follow-up with mammograms.  No need for any further diagnostic test. I Spent 30 minutes in this encounter including personally reviewing imaging studies, reviewing her medical records, coordinating her care, counseling the patient and performing appropriate documentation  Deborah Hamman, MD [facility] Sur[facility]

## 2022-05-24 ENCOUNTER — Ambulatory Visit: Payer: Self-pay | Admitting: Family Medicine

## 2022-06-09 ENCOUNTER — Ambulatory Visit: Payer: Self-pay | Admitting: Dermatology

## 2022-06-29 ENCOUNTER — Ambulatory Visit: Payer: 59 | Admitting: Dermatology

## 2022-06-29 DIAGNOSIS — D492 Neoplasm of unspecified behavior of bone, soft tissue, and skin: Secondary | ICD-10-CM

## 2022-06-29 DIAGNOSIS — L821 Other seborrheic keratosis: Secondary | ICD-10-CM | POA: Diagnosis not present

## 2022-06-29 DIAGNOSIS — D485 Neoplasm of uncertain behavior of skin: Secondary | ICD-10-CM | POA: Diagnosis not present

## 2022-06-29 DIAGNOSIS — L820 Inflamed seborrheic keratosis: Secondary | ICD-10-CM

## 2022-06-29 DIAGNOSIS — L918 Other hypertrophic disorders of the skin: Secondary | ICD-10-CM

## 2022-06-29 NOTE — Progress Notes (Signed)
   Follow-Up Visit   Subjective  Deborah Brandt is a 56 y.o. female who presents for the following: Skin Tag (Patient here for skin tag removal at neck and legs.).  The following portions of the chart were reviewed this encounter and updated as appropriate:   Tobacco  Allergies  Meds  Problems  Med Hx  Surg Hx  Fam Hx      Review of Systems:  No other skin or systemic complaints except as noted in HPI or Assessment and Plan.  Objective  Well appearing patient in no apparent distress; mood and affect are within normal limits.  A focused examination was performed including neck, legs. Relevant physical exam findings are noted in the Assessment and Plan.  neck (15) fleshy, skin-colored pedunculated papules.    left posterior neck 0.4 cm tan papule R/o Skin Tag vs Nevus      right anterior thigh 0.7 cm erythematous skin colored papule R/o nevus vs lipofibroma vs NF or other        Assessment & Plan  Skin tag (15) neck  Benign-appearing.   Pt desires removal. Reviewed cosmetic and price. Pt acknowledges.  Destruction of lesion - neck  Destruction method: cryotherapy   Informed consent: discussed and consent obtained   Lesion destroyed using liquid nitrogen: Yes   Cryotherapy cycles:  2 Outcome: patient tolerated procedure well with no complications   Post-procedure details: wound care instructions given   Additional details:  Prior to procedure, discussed risks of blister formation, small wound, skin dyspigmentation, or rare scar following cryotherapy. Recommend Vaseline ointment to treated areas while healing.   Neoplasm of skin (2) left posterior neck  Epidermal / dermal shaving  Lesion diameter (cm):  0.4 Informed consent: discussed and consent obtained   Timeout: patient name, date of birth, surgical site, and procedure verified   Anesthesia: the lesion was anesthetized in a standard fashion   Anesthetic:  1% lidocaine w/ epinephrine 1-100,000  local infiltration Instrument used: scissors   Hemostasis achieved with: aluminum chloride   Outcome: patient tolerated procedure well   Post-procedure details: wound care instructions given   Additional details:  Mupirocin and a bandage applied  Specimen 1 - Surgical pathology Differential Diagnosis: R/o Skin Tag vs Nevus  Check Margins: No 0.4 cm tan papule   right anterior thigh  Epidermal / dermal shaving  Lesion diameter (cm):  0.7 Informed consent: discussed and consent obtained   Timeout: patient name, date of birth, surgical site, and procedure verified   Anesthesia: the lesion was anesthetized in a standard fashion   Anesthetic:  1% lidocaine w/ epinephrine 1-100,000 local infiltration Instrument used: scissors   Hemostasis achieved with: aluminum chloride   Outcome: patient tolerated procedure well   Post-procedure details: wound care instructions given   Additional details:  Mupirocin and a bandage applied  Specimen 2 - Surgical pathology Differential Diagnosis: R/o nevus vs lipofibroma vs NF or other  Check Margins: No 0.7 cm erythematous skin colored papule    Seborrheic Keratoses - Stuck-on, waxy, tan-brown papules and/or plaques  - Benign-appearing - Discussed benign etiology and prognosis. - Observe - Call for any changes    Return for 4-8 week ISK, skin tags.  Graciella Belton, RMA, am acting as scribe for Forest Gleason, MD .  Documentation: I have reviewed the above documentation for accuracy and completeness, and I agree with the above.  Forest Gleason, MD

## 2022-06-29 NOTE — Patient Instructions (Addendum)
Wound Care Instructions  Cleanse wound gently with soap and water once a day then pat dry with clean gauze. Apply a thin coat of Petrolatum (petroleum jelly, "Vaseline") over the wound (unless you have an allergy to this). We recommend that you use a new, sterile tube of Vaseline. Do not pick or remove scabs. Do not remove the yellow or white "healing tissue" from the base of the wound.  Cover the wound with fresh, clean, nonstick gauze and secure with paper tape. You may use Band-Aids in place of gauze and tape if the wound is small enough, but would recommend trimming much of the tape off as there is often too much. Sometimes Band-Aids can irritate the skin.  You should call the office for your biopsy report after 1 week if you have not already been contacted.  If you experience any problems, such as abnormal amounts of bleeding, swelling, significant bruising, significant pain, or evidence of infection, please call the office immediately.  FOR ADULT SURGERY PATIENTS: If you need something for pain relief you may take 1 extra strength Tylenol (acetaminophen) AND 2 Ibuprofen ('200mg'$  each) together every 4 hours as needed for pain. (do not take these if you are allergic to them or if you have a reason you should not take them.) Typically, you may only need pain medication for 1 to 3 days.   Recommend OTC Gold Bond Rapid Relief Anti-Itch cream (pramoxine + menthol), CeraVe Anti-itch cream or lotion (pramoxine), Sarna lotion (Original- menthol + camphor or Sensitive- pramoxine) or Eucerin 12 hour Itch Relief lotion (menthol) up to 3 times per day to areas on body that are itchy.  Due to recent changes in healthcare laws, you may see results of your pathology and/or laboratory studies on MyChart before the doctors have had a chance to review them. We understand that in some cases there may be results that are confusing or concerning to you. Please understand that not all results are received at the same  time and often the doctors may need to interpret multiple results in order to provide you with the best plan of care or course of treatment. Therefore, we ask that you please give [place] 2 business days to thoroughly review all your results before contacting the office for clarification. Should we see a critical lab result, you will be contacted sooner.   If You Need Anything After Your Visit  If you have any questions or concerns for your doctor, please call our main line at [phone] and press option 4 to reach your doctor's medical assistant. If no one answers, please leave a voicemail as directed and we will return your call as soon as possible. Messages left after 4 pm will be answered the following business day.   You may also send [place] a message via [place]. We typically respond to MyChart messages within 1-2 business days.  For prescription refills, please ask your pharmacy to contact our office. Our fax number is [phone].  If you have an urgent issue when the clinic is closed that cannot wait until the next business day, you can page your doctor at the number below.    Please note that while we do our best to be available for urgent issues outside of office hours, we are not available 24/7.   If you have an urgent issue and are unable to reach [place], you may choose to seek medical care at your doctor's office, retail clinic, urgent care center, or emergency room.  If  you have a medical emergency, please immediately call 911 or go to the emergency department.  Pager Numbers  - Dr. Nehemiah Massed: [phone]  - Dr. Laurence Ferrari: [phone]  - Dr. Nicole Kindred: [phone]  In the event of inclement weather, please call our main line at [phone] for an update on the status of any delays or closures.  Dermatology Medication Tips: Please keep the boxes that topical medications come in in order to help keep track of the instructions about where and how to use these. Pharmacies typically print  the medication instructions only on the boxes and not directly on the medication tubes.   If your medication is too expensive, please contact our office at [phone] option 4 or send [place] a message through [place].   We are unable to tell what your co-pay for medications will be in advance as this is different depending on your insurance coverage. However, we may be able to find a substitute medication at lower cost or fill out paperwork to get insurance to cover a needed medication.   If a prior authorization is required to get your medication covered by your insurance company, please allow [place] 1-2 business days to complete this process.  Drug prices often vary depending on where the prescription is filled and some pharmacies may offer cheaper prices.  The website www.goodrx.com contains coupons for medications through different pharmacies. The prices here do not account for what the cost may be with help from insurance (it may be cheaper with your insurance), but the website can give you the price if you did not use any insurance.  - You can print the associated coupon and take it with your prescription to the pharmacy.  - You may also stop by our office during regular business hours and pick up a GoodRx coupon card.  - If you need your prescription sent electronically to a different pharmacy, notify our office through [facility] or by phone at [phone] option 4.     Si Usted Necesita Algo Despus de Su Visita  Tambin puede enviarnos un mensaje a travs de Pharmacist, community. Por lo general respondemos a los mensajes de MyChart en el transcurso de 1 a 2 das hbiles.  Para renovar recetas, por favor pida a su farmacia que se ponga en contacto con nuestra oficina. Harland Dingwall de fax es [place] [phone].  Si tiene un asunto urgente cuando la clnica est cerrada y que no puede esperar hasta el siguiente da hbil, puede llamar/localizar a su doctor(a) al nmero que aparece a  continuacin.   Por favor, tenga en cuenta que aunque hacemos todo lo posible para estar disponibles para asuntos urgentes fuera del horario de [place], no estamos disponibles las 24 horas del da, los 7 das de la Progress.   Si tiene un problema urgente y no puede comunicarse con nosotros, puede optar por buscar atencin mdica  en el consultorio de su doctor(a), en una clnica privada, en un centro de atencin urgente o en una sala de emergencias.  Si tiene Engineering geologist, por favor llame inmediatamente al 911 o vaya a la sala de emergencias.  Nmeros de bper  - Dr. Nehemiah Massed: [phone]  - Dra. Moye: [phone]  - Dra. Nicole Kindred: [phone]  En caso de inclemencias del [place], por favor llame a Johnsie Kindred principal al [phone] para una actualizacin sobre el [place] de cualquier retraso o cierre.  Consejos para la medicacin en dermatologa: Por favor, guarde las cajas en las que vienen los medicamentos de [place]  tpico para ayudarle a seguir las instrucciones sobre dnde y cmo usarlos. Las farmacias generalmente imprimen las instrucciones del medicamento slo en las cajas y no directamente en los tubos del [place].   Si su medicamento es muy caro, por favor, pngase en contacto con Zigmund Daniel llamando al [phone] y presione la opcin 4 o envenos un mensaje a travs de Pharmacist, community.   No podemos decirle cul ser su copago por los medicamentos por adelantado ya que esto es diferente dependiendo de la cobertura de su seguro. Sin embargo, es posible que podamos encontrar un medicamento sustituto a Electrical engineer un formulario para que el seguro cubra el medicamento que se considera necesario.   Si se requiere una autorizacin previa para que su compaa de seguros [place] su medicamento, por favor permtanos de 1 a 2 das hbiles para completar este proceso.  Los precios de los medicamentos varan con frecuencia dependiendo del Environmental consultant de dnde se surte la receta  y alguna farmacias pueden ofrecer precios ms baratos.  El sitio web www.goodrx.com tiene cupones para medicamentos de Airline pilot. Los precios aqu no tienen en cuenta lo que podra costar con la ayuda del seguro (puede ser ms barato con su seguro), pero el sitio web puede darle el precio si no utiliz Research scientist (physical sciences).  - Puede imprimir el cupn correspondiente y llevarlo con su receta a la farmacia.  - Tambin puede pasar por nuestra oficina durante el horario de atencin regular y Charity fundraiser una tarjeta de cupones de GoodRx.  - Si necesita que su receta se enve electrnicamente a una farmacia diferente, informe a nuestra oficina a travs de MyChart de [place] o por telfono llamando al [phone] y presione la opcin 4.

## 2022-07-03 ENCOUNTER — Encounter: Payer: Self-pay | Admitting: Dermatology

## 2022-07-05 ENCOUNTER — Telehealth: Payer: Self-pay

## 2022-07-05 NOTE — Telephone Encounter (Signed)
Discussed pathology results. Patient voiced understanding.  

## 2022-07-05 NOTE — Telephone Encounter (Signed)
-----   Message from Alfonso Patten, MD sent at 07/03/2022  3:38 PM EST ----- 1. Skin , left posterior neck ACROCHORDON  Skin tag, benign, no additional treatment needed.   2. Skin , right anterior thigh NEVUS LIPOMATOSUS SUPERFICIALIS  This is a benign growth.  No additional treatment is needed.  MAs please call. Thank you!

## 2022-07-18 ENCOUNTER — Other Ambulatory Visit: Payer: Self-pay | Admitting: Family Medicine

## 2022-07-18 DIAGNOSIS — E785 Hyperlipidemia, unspecified: Secondary | ICD-10-CM

## 2022-07-18 DIAGNOSIS — R739 Hyperglycemia, unspecified: Secondary | ICD-10-CM

## 2022-07-26 ENCOUNTER — Other Ambulatory Visit (INDEPENDENT_AMBULATORY_CARE_PROVIDER_SITE_OTHER): Payer: 59

## 2022-07-26 DIAGNOSIS — R739 Hyperglycemia, unspecified: Secondary | ICD-10-CM

## 2022-07-26 DIAGNOSIS — E785 Hyperlipidemia, unspecified: Secondary | ICD-10-CM | POA: Diagnosis not present

## 2022-07-26 LAB — COMPREHENSIVE METABOLIC PANEL
ALT: 24 U/L (ref 0–35)
AST: 17 U/L (ref 0–37)
Albumin: 4.6 g/dL (ref 3.5–5.2)
Alkaline Phosphatase: 54 U/L (ref 39–117)
BUN: 16 mg/dL (ref 6–23)
CO2: 28 mEq/L (ref 19–32)
Calcium: 9.7 mg/dL (ref 8.4–10.5)
Chloride: 104 mEq/L (ref 96–112)
Creatinine, Ser: 0.84 mg/dL (ref 0.40–1.20)
GFR: 77.08 mL/min (ref >60.00)
Glucose, Bld: 119 mg/dL — ABNORMAL HIGH (ref 70–99)
Potassium: 4.5 mEq/L (ref 3.5–5.1)
Sodium: 139 mEq/L (ref 135–145)
Total Bilirubin: 0.4 mg/dL (ref 0.2–1.2)
Total Protein: 6.9 g/dL (ref 6.0–8.3)

## 2022-07-26 LAB — LIPID PANEL
Cholesterol: 221 mg/dL — ABNORMAL HIGH (ref 0–200)
HDL: 48.1 mg/dL (ref >39.00)
LDL Cholesterol: 141 mg/dL — ABNORMAL HIGH (ref 0–99)
NonHDL: 172.97
Total CHOL/HDL Ratio: 5
Triglycerides: 162 mg/dL — ABNORMAL HIGH (ref 0.0–149.0)
VLDL: 32.4 mg/dL (ref 0.0–40.0)

## 2022-07-26 LAB — HEMOGLOBIN A1C: Hgb A1c MFr Bld: 6.9 % — ABNORMAL HIGH (ref 4.6–6.5)

## 2022-08-02 ENCOUNTER — Ambulatory Visit (INDEPENDENT_AMBULATORY_CARE_PROVIDER_SITE_OTHER)
Admission: RE | Admit: 2022-08-02 | Discharge: 2022-08-02 | Disposition: A | Discharge status: Home / Self Care | Payer: 59 | Source: Ambulatory Visit | Attending: Family Medicine | Admitting: Family Medicine

## 2022-08-02 ENCOUNTER — Ambulatory Visit (INDEPENDENT_AMBULATORY_CARE_PROVIDER_SITE_OTHER): Payer: 59 | Admitting: Family Medicine

## 2022-08-02 ENCOUNTER — Encounter: Payer: Self-pay | Admitting: Family Medicine

## 2022-08-02 VITALS — BP 122/82 | HR 83 | Temp 97.6°F | Ht 60.0 in | Wt 214.0 lb

## 2022-08-02 DIAGNOSIS — Z0000 Encounter for general adult medical examination without abnormal findings: Secondary | ICD-10-CM | POA: Diagnosis not present

## 2022-08-02 DIAGNOSIS — M25559 Pain in unspecified hip: Secondary | ICD-10-CM

## 2022-08-02 DIAGNOSIS — E119 Type 2 diabetes mellitus without complications: Secondary | ICD-10-CM

## 2022-08-02 DIAGNOSIS — Z23 Encounter for immunization: Secondary | ICD-10-CM

## 2022-08-02 DIAGNOSIS — M545 Low back pain, unspecified: Secondary | ICD-10-CM | POA: Diagnosis not present

## 2022-08-02 DIAGNOSIS — Z7189 Other specified counseling: Secondary | ICD-10-CM

## 2022-08-02 DIAGNOSIS — Z8639 Personal history of other endocrine, nutritional and metabolic disease: Secondary | ICD-10-CM | POA: Insufficient documentation

## 2022-08-02 NOTE — Progress Notes (Unsigned)
CPE- See plan.  Routine anticipatory guidance given to patient.  See health maintenance.  The possibility exists that previously documented standard health maintenance information may have been brought forward from a previous encounter into this note.  If needed, that same information has been updated to reflect the current situation based on today's encounter.    Tetanus 2018 Flu 2023.   PNA d/w pt.  Shingles d/w pt.  covid vaccine 2021.   Colonoscopy 2019 Mammogram 2023 Pap up to date.  DXA not due.   Living will d/w pt. Boyfriend Clent Demark designated if patient were incapacitated. Diet and exercise.  D/w pt.   Diabetes:  No meds.  D/w pt about dx.  Affected by hip pain, ie dec in exercise.  Labs d/w pt.  H/o GDM.  D/w pt about low sugar diet and handout given/discussed.    Hip pain. Ongoing.  Pain since prev childbirth, with prev steroid injections done years ago.  No pain sitting.  Pain walking and standing.  Sitting helps the pain immediately.  No radiation down the legs. Gradually worse in the last year.    Diverticulitis improved after sigmoid colectomy.    Cough resolved, off flovent and albuterol now.    PMH and SH reviewed  Meds, vitals, and allergies reviewed.   ROS: Per HPI.  Unless specifically indicated otherwise in HPI, the patient denies:  General: fever. Eyes: acute vision changes ENT: sore throat Cardiovascular: chest pain Respiratory: SOB GI: vomiting GU: dysuria Musculoskeletal: acute back pain Derm: acute rash Neuro: acute motor dysfunction Psych: worsening mood Endocrine: polydipsia Heme: bleeding Allergy: hayfever  GEN: nad, alert and oriented HEENT: ncat NECK: supple w/o LA CV: rrr. PULM: ctab, no inc wob ABD: soft, +bs EXT: no edema SKIN: no acute rash Lower back ttp.  B greater troch ttp SLR neg B Neg int rotation B hips.

## 2022-08-02 NOTE — Patient Instructions (Addendum)
Use the eat right diet.  Recheck A1c at a visit in about 3 months.   Go to the lab on the way out.   If you have mychart we'll likely use that to update you.    Take care.  Glad to see you. Ice your hips, on the sides.  5 minutes at a time.  Possible trochanteric bursitis.

## 2022-08-04 DIAGNOSIS — M25559 Pain in unspecified hip: Secondary | ICD-10-CM | POA: Insufficient documentation

## 2022-08-04 NOTE — Assessment & Plan Note (Signed)
Tetanus 2018 Flu 2023.   PNA d/w pt.  Shingles d/w pt.  covid vaccine 2021.   Colonoscopy 2019 Mammogram 2023 Pap up to date.  DXA not due.   Living will d/w pt. Boyfriend Clent Demark designated if patient were incapacitated. Diet and exercise.  D/w pt.

## 2022-08-04 NOTE — Assessment & Plan Note (Addendum)
Possible trochanteric bursitis as opposed to intrinsic hip pathology.  Discussed with patient.  She also has lower back pain which could be a separate issue where she could be altering her gait due to trochanteric bursitis.  Discussed checking plain films and ice and trochanteric area for 5 minutes at a time as needed.  If she continues to have pain then we need to give consideration to potential spinal stenosis and potentially get an MRI of her back.

## 2022-08-04 NOTE — Assessment & Plan Note (Signed)
No meds.  D/w pt about dx.  Affected by hip pain, ie dec in exercise.  Labs d/w pt.  H/o GDM.  D/w pt about low sugar diet and handout given/discussed. Recheck A1c at a visit in about 3 months.

## 2022-08-04 NOTE — Assessment & Plan Note (Signed)
Living will d/w pt. Boyfriend Clent Demark designated if patient were incapacitated.

## 2022-08-05 ENCOUNTER — Telehealth: Payer: 59 | Admitting: Emergency Medicine

## 2022-08-05 DIAGNOSIS — M545 Low back pain, unspecified: Secondary | ICD-10-CM | POA: Diagnosis not present

## 2022-08-05 DIAGNOSIS — G8929 Other chronic pain: Secondary | ICD-10-CM | POA: Diagnosis not present

## 2022-08-05 MED ORDER — CYCLOBENZAPRINE HCL 10 MG PO TABS: 10.0000 mg | ORAL_TABLET | Freq: Three times a day (TID) | ORAL | 0 refills | Status: DC | PRN

## 2022-08-05 NOTE — Progress Notes (Signed)
We are sorry that you are not feeling well.  Here is how we plan to help!  Based on what you have shared with me it looks like you mostly have acute back pain.  Acute back pain is defined as musculoskeletal pain that can resolve in 1-3 weeks with conservative treatment. .  I have prescribed Flexeril 10 mg every eight hours as needed which is a muscle relaxer. You can take tylenol or ibuprofen for pain per package directions. I see both medicines on your medication list. If you need a prescription for ibuprofen, please let me know.   Some patients experience stomach irritation or in increased heartburn with anti-inflammatory drugs.  Please keep in mind that muscle relaxer's can cause fatigue and should not be taken while at work or driving.  Back pain is very common.  The pain often gets better over time.  The cause of back pain is usually not dangerous.  Most people can learn to manage their back pain on their own.  Home Care Stay active.  Start with short walks on flat ground if you can.  Try to walk farther each day. Do not sit, drive or stand in one place for more than 30 minutes.  Do not stay in bed. Do not avoid exercise or work.  Activity can help your back heal faster. Be careful when you bend or lift an object.  Bend at your knees, keep the object close to you, and do not twist. Sleep on a firm mattress.  Lie on your side, and bend your knees.  If you lie on your back, put a pillow under your knees. Only take medicines as told by your doctor. Put ice on the injured area. Put ice in a plastic bag Place a towel between your skin and the bag Leave the ice on for 15-20 minutes, 3-4 times a day for the first 2-3 days. 210 After that, you can switch between ice and heat packs. Ask your doctor about back exercises or massage. Avoid feeling anxious or stressed.  Find good ways to deal with stress, such as exercise.  Get Help Right Way If: Your pain does not go away with rest or  medicine. Your pain does not go away in 1 week. You have new problems. You do not feel well. The pain spreads into your legs. You cannot control when you poop (bowel movement) or pee (urinate) You feel sick to your stomach (nauseous) or throw up (vomit) You have belly (abdominal) pain. You feel like you may pass out (faint). If you develop a fever.  Make Sure you: Understand these instructions. Will watch your condition Will get help right away if you are not doing well or get worse.  Your e-visit answers were reviewed by a board certified advanced clinical practitioner to complete your personal care plan.  Depending on the condition, your plan could have included both over the counter or prescription medications.  If there is a problem please reply  once you have received a response from your provider.  Your safety is important to [place].  If you have drug allergies check your prescription carefully.    You can use MyChart to ask questions about today's visit, request a non-urgent call back, or ask for a work or school excuse for 24 hours related to this e-Visit. If it has been greater than 24 hours you will need to follow up with your provider, or enter a new e-Visit to address those concerns.  You will  get an e-mail in the next two days asking about your experience.  I hope that your e-visit has been valuable and will speed your recovery. Thank you for using e-visits.  I have spent 5 minutes in review of e-visit questionnaire, review and updating patient chart, medical decision making and response to patient.   Willeen Cass, PhD, FNP-BC

## 2022-09-02 ENCOUNTER — Ambulatory Visit (INDEPENDENT_AMBULATORY_CARE_PROVIDER_SITE_OTHER): Payer: 59 | Admitting: Dermatology

## 2022-09-02 ENCOUNTER — Encounter: Payer: Self-pay | Admitting: Dermatology

## 2022-09-02 VITALS — BP 131/73 | HR 70

## 2022-09-02 DIAGNOSIS — B029 Zoster without complications: Secondary | ICD-10-CM

## 2022-09-02 DIAGNOSIS — B078 Other viral warts: Secondary | ICD-10-CM

## 2022-09-02 DIAGNOSIS — L219 Seborrheic dermatitis, unspecified: Secondary | ICD-10-CM | POA: Diagnosis not present

## 2022-09-02 DIAGNOSIS — L820 Inflamed seborrheic keratosis: Secondary | ICD-10-CM

## 2022-09-02 MED ORDER — VALACYCLOVIR HCL 1 G PO TABS: 1000.0000 mg | ORAL_TABLET | Freq: Three times a day (TID) | ORAL | 0 refills | Status: AC

## 2022-09-02 MED ORDER — HYDROCORTISONE 2.5 % EX CREA: TOPICAL_CREAM | CUTANEOUS | 2 refills | Status: DC

## 2022-09-02 NOTE — Progress Notes (Signed)
Follow-Up Visit   Subjective  Deborah Brandt is a 57 y.o. female who presents for the following: Skin Tag (Here for removal of more skin tags. Patient states she was recently diagnosed with diabetes).  The patient has spots, moles and lesions to be evaluated, some may be new or changing and the patient has concerns that these could be cancer.  The following portions of the chart were reviewed this encounter and updated as appropriate:  Tobacco  Allergies  Meds  Problems  Med Hx  Surg Hx  Fam Hx      Review of Systems: No other skin or systemic complaints except as noted in HPI or Assessment and Plan.   Objective  Well appearing patient in no apparent distress; mood and affect are within normal limits.  A focused examination was performed including face, neck. Relevant physical exam findings are noted in the Assessment and Plan.  Head - Anterior (Face) No obvious signs of zoster on clinical exam  eyebrows, forehead Pink patches with greasy scale.   Left pretibia x1 Erythematous keratotic or waxy stuck-on papule or plaque.  left clavicle x1, right base of neck x1 (2) Verrucous papules -- Discussed viral etiology and contagion.    Assessment & Plan  Herpes zoster without complication Head - Anterior (Face)  History of occult zoster - had erythema but never developed vesicles.  Patient c/o tingling sensation left forehead V1 dermatome without any rash  Start Valacyclovir 1 gm take 1 tablet 3 times a day for 7 days with a glass of water  Recommend Shingrix vaccine when not taking valacyclovir and when she has no signs of active shingles.    valACYclovir (VALTREX) 1000 MG tablet - Head - Anterior (Face) Take 1 tablet (1,000 mg total) by mouth 3 (three) times daily for 7 days.  Seborrheic dermatitis eyebrows, forehead  Chronic and persistent condition with duration or expected duration over one year. Condition is bothersome/symptomatic for patient. Currently  flared.  Start Hydrocortisone 2.5% cream twice daily for up to 1-2 weeks as needed for rash/itching  Topical steroids (such as triamcinolone, fluocinolone, fluocinonide, mometasone, clobetasol, halobetasol, betamethasone, hydrocortisone) can cause thinning and lightening of the skin if they are used for too long in the same area. Your physician has selected the right strength medicine for your problem and area affected on the body. Please use your medication only as directed by your physician to prevent side effects.    hydrocortisone 2.5 % cream - eyebrows, forehead Apply twice daily 1-2 weeks as needed for rash/itching on face  Inflamed seborrheic keratosis Left pretibia x1  Symptomatic, irritating, patient would like treated. Gets cut when shaving.  Benign-appearing.  Observation.  Call clinic for new or changing lesions.    Destruction of lesion - Left pretibia x1  Destruction method: cryotherapy   Informed consent: discussed and consent obtained   Lesion destroyed using liquid nitrogen: Yes   Region frozen until ice ball extended beyond lesion: Yes   Outcome: patient tolerated procedure well with no complications   Post-procedure details: wound care instructions given   Additional details:  Prior to procedure, discussed risks of blister formation, small wound, skin dyspigmentation, or rare scar following cryotherapy. Recommend Vaseline ointment to treated areas while healing.   Other viral warts (2) left clavicle x1, right base of neck x1  Filiform warts  Viral Wart (HPV) Counseling  Discussed viral / HPV (Human Papilloma Virus) etiology and risk of spread /infectivity to other areas of body as well  as to other people.  Multiple treatments and methods may be required to clear warts and it is possible treatment may not be successful.  Treatment risks include discoloration; scarring and there is still potential for wart recurrence.  Destruction of lesion - left clavicle x1,  right base of neck x1  Destruction method: cryotherapy   Informed consent: discussed and consent obtained   Lesion destroyed using liquid nitrogen: Yes   Region frozen until ice ball extended beyond lesion: Yes   Outcome: patient tolerated procedure well with no complications   Post-procedure details: wound care instructions given   Additional details:  Prior to procedure, discussed risks of blister formation, small wound, skin dyspigmentation, or rare scar following cryotherapy. Recommend Vaseline ointment to treated areas while healing.    Return if symptoms worsen or fail to improve.  I, Emelia Salisbury, CMA, am acting as scribe for Forest Gleason, MD.  Documentation: I have reviewed the above documentation for accuracy and completeness, and I agree with the above.  Forest Gleason, MD

## 2022-09-02 NOTE — Patient Instructions (Addendum)
Cryotherapy Aftercare  Wash gently with soap and water everyday.   Apply Vaseline and Band-Aid daily until healed.   Start Hydrocortisone 2.5% cream twice daily 1-2 weeks as needed for rash/itching  Topical steroids (such as triamcinolone, fluocinolone, fluocinonide, mometasone, clobetasol, halobetasol, betamethasone, hydrocortisone) can cause thinning and lightening of the skin if they are used for too long in the same area. Your physician has selected the right strength medicine for your problem and area affected on the body. Please use your medication only as directed by your physician to prevent side effects.    Valacyclovir 1 gm take 1 tablet 3 times a day for 7 days   Due to recent changes in healthcare laws, you may see results of your pathology and/or laboratory studies on MyChart before the doctors have had a chance to review them. We understand that in some cases there may be results that are confusing or concerning to you. Please understand that not all results are received at the same time and often the doctors may need to interpret multiple results in order to provide you with the best plan of care or course of treatment. Therefore, we ask that you please give [place] 2 business days to thoroughly review all your results before contacting the office for clarification. Should we see a critical lab result, you will be contacted sooner.   If You Need Anything After Your Visit  If you have any questions or concerns for your doctor, please call our main line at [phone] and press option 4 to reach your doctor's medical assistant. If no one answers, please leave a voicemail as directed and we will return your call as soon as possible. Messages left after 4 pm will be answered the following business day.   You may also send [place] a message via [place]. We typically respond to MyChart messages within 1-2 business days.  For prescription refills, please ask your pharmacy to contact our office.  Our fax number is [phone].  If you have an urgent issue when the clinic is closed that cannot wait until the next business day, you can page your doctor at the number below.    Please note that while we do our best to be available for urgent issues outside of office hours, we are not available 24/7.   If you have an urgent issue and are unable to reach [place], you may choose to seek medical care at your doctor's office, retail clinic, urgent care center, or emergency room.  If you have a medical emergency, please immediately call 911 or go to the emergency department.  Pager Numbers  - Dr. Nehemiah Massed: [phone]  - Dr. Laurence Ferrari: [phone]  - Dr. Nicole Kindred: [phone]  In the event of inclement weather, please call our main line at [phone] for an update on the status of any delays or closures.  Dermatology Medication Tips: Please keep the boxes that topical medications come in in order to help keep track of the instructions about where and how to use these. Pharmacies typically print the medication instructions only on the boxes and not directly on the medication tubes.   If your medication is too expensive, please contact our office at [phone] option 4 or send [place] a message through [place].   We are unable to tell what your co-pay for medications will be in advance as this is different depending on your insurance coverage. However, we may be able to find a substitute medication at lower cost or fill out paperwork to  get insurance to cover a needed medication.   If a prior authorization is required to get your medication covered by your insurance company, please allow [place] 1-2 business days to complete this process.  Drug prices often vary depending on where the prescription is filled and some pharmacies may offer cheaper prices.  The website www.goodrx.com contains coupons for medications through different pharmacies. The prices here do not account for what the cost may be with  help from insurance (it may be cheaper with your insurance), but the website can give you the price if you did not use any insurance.  - You can print the associated coupon and take it with your prescription to the pharmacy.  - You may also stop by our office during regular business hours and pick up a GoodRx coupon card.  - If you need your prescription sent electronically to a different pharmacy, notify our office through [facility] or by phone at [phone] option 4.     Si Usted Necesita Algo Despus de Su Visita  Tambin puede enviarnos un mensaje a travs de Pharmacist, community. Por lo general respondemos a los mensajes de MyChart en el transcurso de 1 a 2 das hbiles.  Para renovar recetas, por favor pida a su farmacia que se ponga en contacto con nuestra oficina. Harland Dingwall de fax es [place] [phone].  Si tiene un asunto urgente cuando la clnica est cerrada y que no puede esperar hasta el siguiente da hbil, puede llamar/localizar a su doctor(a) al nmero que aparece a continuacin.   Por favor, tenga en cuenta que aunque hacemos todo lo posible para estar disponibles para asuntos urgentes fuera del horario de [place], no estamos disponibles las 24 horas del da, los 7 das de la [place].   Si tiene un problema urgente y no puede comunicarse con nosotros, puede optar por buscar atencin mdica  en el consultorio de su doctor(a), en una clnica privada, en un centro de atencin urgente o en una sala de emergencias.  Si tiene Engineering geologist, por favor llame inmediatamente al 911 o vaya a la sala de emergencias.  Nmeros de bper  - Dr. Nehemiah Massed: [phone]  - Dra. Moye: [phone]  - Dra. Nicole Kindred: [phone]  En caso de inclemencias del [place], por favor llame a Johnsie Kindred principal al [phone] para una actualizacin sobre el [place] de cualquier retraso o cierre.  Consejos para la medicacin en dermatologa: Por favor, guarde las cajas en las que vienen  los medicamentos de uso tpico para ayudarle a seguir las instrucciones sobre dnde y cmo usarlos. Las farmacias generalmente imprimen las instrucciones del medicamento slo en las cajas y no directamente en los tubos del [place].   Si su medicamento es muy caro, por favor, pngase en contacto con Zigmund Daniel llamando al [phone] y presione la opcin 4 o envenos un mensaje a travs de Pharmacist, community.   No podemos decirle cul ser su copago por los medicamentos por adelantado ya que esto es diferente dependiendo de la cobertura de su seguro. Sin embargo, es posible que podamos encontrar un medicamento sustituto a Electrical engineer un formulario para que el seguro cubra el medicamento que se considera necesario.   Si se requiere una autorizacin previa para que su compaa de seguros [place] su medicamento, por favor permtanos de 1 a 2 das hbiles para completar este proceso.  Los precios de los medicamentos varan con frecuencia dependiendo del Environmental consultant de dnde se surte la receta y Environmental health practitioner pueden  ofrecer precios ms baratos.  El sitio web www.goodrx.com tiene cupones para medicamentos de Airline pilot. Los precios aqu no tienen en cuenta lo que podra costar con la ayuda del seguro (puede ser ms barato con su seguro), pero el sitio web puede darle el precio si no utiliz Research scientist (physical sciences).  - Puede imprimir el cupn correspondiente y llevarlo con su receta a la farmacia.  - Tambin puede pasar por nuestra oficina durante el horario de atencin regular y Charity fundraiser una tarjeta de cupones de GoodRx.  - Si necesita que su receta se enve electrnicamente a una farmacia diferente, informe a nuestra oficina a travs de MyChart de [place] o por telfono llamando al [phone] y presione la opcin 4.

## 2022-09-13 ENCOUNTER — Encounter: Payer: Self-pay | Admitting: Dermatology

## 2022-11-01 ENCOUNTER — Encounter: Payer: Self-pay | Admitting: Family Medicine

## 2022-11-01 ENCOUNTER — Ambulatory Visit: Payer: 59 | Admitting: Family Medicine

## 2022-11-01 VITALS — BP 104/66 | HR 82 | Temp 97.3°F | Ht 60.0 in | Wt 192.0 lb

## 2022-11-01 DIAGNOSIS — Z8639 Personal history of other endocrine, nutritional and metabolic disease: Secondary | ICD-10-CM | POA: Diagnosis not present

## 2022-11-01 DIAGNOSIS — E119 Type 2 diabetes mellitus without complications: Secondary | ICD-10-CM

## 2022-11-01 LAB — POCT GLYCOSYLATED HEMOGLOBIN (HGB A1C): Hemoglobin A1C: 5.9 % — AB (ref 4.0–5.6)

## 2022-11-01 NOTE — Patient Instructions (Addendum)
Please get a nonfasting last visit in about 4 months to get an A1c done.  Then plan on recheck labs prior to physical at the end of 2024 or early 2025.  Update me as needed. Thanks for your effort.  Take care.  Glad to see you.

## 2022-11-01 NOTE — Progress Notes (Unsigned)
H/o DM but not currently based on A1c.   No meds  Hypoglycemic episodes: cautions d/w pt.  If prolonged fasting.   Hyperglycemic episodes: no  Feet problems: no Blood Sugars averaging: not checked.   She is down 22 lbs.  She has been working limiting carbs.  D/w pt.  She though her diet was sustainable.  Work situation d/w pt.    Meds, vitals, and allergies reviewed.  ROS: Per HPI unless specifically indicated in ROS section   GEN: nad, alert and oriented HEENT: ncat NECK: supple w/o LA CV: rrr. PULM: ctab, no inc wob ABD: soft, +bs EXT: no edema SKIN: no acute rash

## 2022-11-03 NOTE — Assessment & Plan Note (Signed)
H/o DM but not currently based on A1c.  I thanked her for her effort.  Continue work on diet and exercise.  Update me as needed.  Reasonable to get a nonfasting last visit in about 4 months to get an A1c done.  Then plan on recheck labs prior to physical at the end of 2024 or early 2025.

## 2023-01-25 ENCOUNTER — Other Ambulatory Visit: Payer: Self-pay

## 2023-01-25 DIAGNOSIS — Z1231 Encounter for screening mammogram for malignant neoplasm of breast: Secondary | ICD-10-CM

## 2023-01-28 ENCOUNTER — Encounter: Payer: Self-pay | Admitting: Family Medicine

## 2023-01-30 NOTE — Telephone Encounter (Signed)
Please see message from patient and see about rescheduling her visit here.  Thanks.

## 2023-02-24 ENCOUNTER — Other Ambulatory Visit: Payer: Self-pay | Admitting: Family Medicine

## 2023-02-24 DIAGNOSIS — Z1231 Encounter for screening mammogram for malignant neoplasm of breast: Secondary | ICD-10-CM

## 2023-03-03 ENCOUNTER — Other Ambulatory Visit (INDEPENDENT_AMBULATORY_CARE_PROVIDER_SITE_OTHER): Payer: 59

## 2023-03-03 DIAGNOSIS — E119 Type 2 diabetes mellitus without complications: Secondary | ICD-10-CM | POA: Diagnosis not present

## 2023-03-03 LAB — POCT GLYCOSYLATED HEMOGLOBIN (HGB A1C): Hemoglobin A1C: 5.8 % — AB (ref 4.0–5.6)

## 2023-03-14 ENCOUNTER — Ambulatory Visit: Payer: 59 | Admitting: Surgery

## 2023-03-16 ENCOUNTER — Ambulatory Visit
Admission: RE | Admit: 2023-03-16 | Discharge: 2023-03-16 | Disposition: A | Discharge status: Home / Self Care | Payer: 59 | Source: Ambulatory Visit | Attending: Family Medicine | Admitting: Family Medicine

## 2023-03-16 DIAGNOSIS — Z1231 Encounter for screening mammogram for malignant neoplasm of breast: Secondary | ICD-10-CM | POA: Diagnosis present

## 2023-07-19 ENCOUNTER — Ambulatory Visit (INDEPENDENT_AMBULATORY_CARE_PROVIDER_SITE_OTHER): Payer: 59 | Admitting: Family Medicine

## 2023-07-19 ENCOUNTER — Encounter: Payer: Self-pay | Admitting: Family Medicine

## 2023-07-19 VITALS — BP 112/70 | HR 71 | Temp 99.0°F | Ht 60.0 in | Wt 199.8 lb

## 2023-07-19 DIAGNOSIS — M5416 Radiculopathy, lumbar region: Secondary | ICD-10-CM | POA: Diagnosis not present

## 2023-07-19 MED ORDER — PREDNISONE 20 MG PO TABS: ORAL_TABLET | ORAL | 0 refills | Status: DC

## 2023-07-19 MED ORDER — GABAPENTIN 100 MG PO CAPS: 100.0000 mg | ORAL_CAPSULE | Freq: Three times a day (TID) | ORAL | 2 refills | Status: DC | PRN

## 2023-07-19 NOTE — Patient Instructions (Signed)
Prednisone with food, not with aleve or ibuprofen.  Gradually increase gabapentin, sedation caution.  You should get a call about the MRI.  Take care.  Glad to see you.

## 2023-07-19 NOTE — Progress Notes (Unsigned)
L>R leg pain.  This flare started about 1 week ago.  It was sore but then it got worse.  "This has been going on for years."  H/o steroid injections in the past.  Has been taking tylenol and ibuprofen.  She has B buttock pain then to the lateral hip area.  Doesn't radiate down the leg.  Not worse standing.  More pain sitting in the car.  Less pain laying down but then can wake up in pain.  She noted L foot drop recently, unclear if from weakness vs pain.  She noted more pronation on the L foot.    Meds, vitals, and allergies reviewed.   ROS: Per HPI unless specifically indicated in ROS section   Nad Ncat Neck supple, no LA Rrr ctab Back not ttp but lower buttock upper hamstring ttp B SLR neg B  B int hip rotation wnl.  S/S wnl BLE, no foot drop on exam.

## 2023-07-20 DIAGNOSIS — M5416 Radiculopathy, lumbar region: Secondary | ICD-10-CM | POA: Insufficient documentation

## 2023-07-20 NOTE — Assessment & Plan Note (Signed)
The concern is for a lumbar radiculopathy radiating into the B buttocks.   Prednisone with food, not with aleve or ibuprofen.  Steroid cautions given to patient. Gradually increase gabapentin, sedation caution.  MRI ordered.  Patient agrees to plan.  Routine cautions given to patient.

## 2023-07-24 ENCOUNTER — Other Ambulatory Visit: Payer: Self-pay | Admitting: Family Medicine

## 2023-07-24 DIAGNOSIS — R739 Hyperglycemia, unspecified: Secondary | ICD-10-CM

## 2023-07-28 ENCOUNTER — Other Ambulatory Visit (INDEPENDENT_AMBULATORY_CARE_PROVIDER_SITE_OTHER): Payer: 59

## 2023-07-28 DIAGNOSIS — R739 Hyperglycemia, unspecified: Secondary | ICD-10-CM | POA: Diagnosis not present

## 2023-07-28 LAB — COMPREHENSIVE METABOLIC PANEL
ALT: 23 U/L (ref 0–35)
AST: 13 U/L (ref 0–37)
Albumin: 4.4 g/dL (ref 3.5–5.2)
Alkaline Phosphatase: 55 U/L (ref 39–117)
BUN: 23 mg/dL (ref 6–23)
CO2: 31 meq/L (ref 19–32)
Calcium: 9.7 mg/dL (ref 8.4–10.5)
Chloride: 101 meq/L (ref 96–112)
Creatinine, Ser: 0.8 mg/dL (ref 0.40–1.20)
GFR: 81.16 mL/min (ref >60.00)
Glucose, Bld: 107 mg/dL — ABNORMAL HIGH (ref 70–99)
Potassium: 4 meq/L (ref 3.5–5.1)
Sodium: 138 meq/L (ref 135–145)
Total Bilirubin: 0.4 mg/dL (ref 0.2–1.2)
Total Protein: 7.2 g/dL (ref 6.0–8.3)

## 2023-07-28 LAB — HEMOGLOBIN A1C: Hgb A1c MFr Bld: 6.3 % (ref 4.6–6.5)

## 2023-07-28 LAB — LIPID PANEL
Cholesterol: 229 mg/dL — ABNORMAL HIGH (ref 0–200)
HDL: 52.4 mg/dL (ref >39.00)
LDL Cholesterol: 141 mg/dL — ABNORMAL HIGH (ref 0–99)
NonHDL: 176.33
Total CHOL/HDL Ratio: 4
Triglycerides: 176 mg/dL — ABNORMAL HIGH (ref 0.0–149.0)
VLDL: 35.2 mg/dL (ref 0.0–40.0)

## 2023-07-29 ENCOUNTER — Encounter: Payer: Self-pay | Admitting: *Deleted

## 2023-08-04 ENCOUNTER — Ambulatory Visit (INDEPENDENT_AMBULATORY_CARE_PROVIDER_SITE_OTHER): Payer: 59 | Admitting: Family Medicine

## 2023-08-04 ENCOUNTER — Encounter: Payer: Self-pay | Admitting: Family Medicine

## 2023-08-04 VITALS — BP 128/80 | HR 66 | Temp 99.3°F | Ht 59.71 in | Wt 201.2 lb

## 2023-08-04 DIAGNOSIS — M5416 Radiculopathy, lumbar region: Secondary | ICD-10-CM

## 2023-08-04 DIAGNOSIS — R739 Hyperglycemia, unspecified: Secondary | ICD-10-CM

## 2023-08-04 DIAGNOSIS — Z0000 Encounter for general adult medical examination without abnormal findings: Secondary | ICD-10-CM | POA: Diagnosis not present

## 2023-08-04 DIAGNOSIS — Z7189 Other specified counseling: Secondary | ICD-10-CM

## 2023-08-04 NOTE — Patient Instructions (Signed)
MRI back and then we'll go from there.  Take care.  Glad to see you.

## 2023-08-04 NOTE — Progress Notes (Signed)
CPE- See plan.  Routine anticipatory guidance given to patient.  See health maintenance.  The possibility exists that previously documented standard health maintenance information may have been brought forward from a previous encounter into this note.  If needed, that same information has been updated to reflect the current situation based on today's encounter.    Tetanus 2018 Flu 2024 PNA d/w pt.  Shingles d/w pt.  covid vaccine 2024 Colonoscopy 2019 Mammogram 2024 Pap 2021- defer 2024 due to back pain.   DXA not due.   Living will d/w pt. Boyfriend Tawanna Sat designated if patient were incapacitated. Diet and exercise.  D/w pt.  [facility]    Her back pain is better but not resolved.  Done with prednisone.  Hasn't needed gabapentin.  Taking prn ibuprofen.  Tylenol used rarely. Less radicular pain.  Pain squatting down.  No FCNAVD.  No new weakness.  Hyperglycemia.  She had prev cut out carbs and gluten and had sig weight loss.  She retained some abdominal fatty tissue.  D/w pt about trying get her back addressed to help with exercise.  She is already working on her diet.    PMH and SH reviewed  Meds, vitals, and allergies reviewed.   ROS: Per HPI.  Unless specifically indicated otherwise in HPI, the patient denies:  General: fever. Eyes: acute vision changes ENT: sore throat Cardiovascular: chest pain Respiratory: SOB GI: vomiting GU: dysuria Musculoskeletal: acute back pain Derm: acute rash Neuro: acute motor dysfunction Psych: worsening mood Endocrine: polydipsia Heme: bleeding Allergy: hayfever  GEN: nad, alert and oriented HEENT: ncat NECK: supple w/o LA CV: rrr. PULM: ctab, no inc wob ABD: soft, +bs EXT: no edema SKIN: no acute rash

## 2023-08-07 NOTE — Assessment & Plan Note (Signed)
Tetanus 2018 Flu 2024 PNA d/w pt.  Shingles d/w pt.  covid vaccine 2024 Colonoscopy 2019 Mammogram 2024 Pap 2021- defer 2024 due to back pain.   DXA not due.   Living will d/w pt. Boyfriend Tawanna Sat designated if patient were incapacitated. Diet and exercise.  D/w pt.  [facility]

## 2023-08-07 NOTE — Assessment & Plan Note (Signed)
Will await her MRI.  Will go from there.  She has improved some in the meantime but her symptoms have not completely resolved.

## 2023-08-07 NOTE — Assessment & Plan Note (Signed)
She had prev cut out carbs and gluten and had sig weight loss.  She retained some abdominal fatty tissue.  D/w pt about trying get her back addressed to help with exercise.  She is already working on her diet.

## 2023-08-07 NOTE — Assessment & Plan Note (Signed)
Living will d/w pt. Boyfriend Clent Demark designated if patient were incapacitated.

## 2023-08-25 ENCOUNTER — Ambulatory Visit
Admission: RE | Admit: 2023-08-25 | Discharge: 2023-08-25 | Disposition: A | Discharge status: Home / Self Care | Payer: 59 | Source: Ambulatory Visit | Attending: Family Medicine | Admitting: Family Medicine

## 2023-08-25 DIAGNOSIS — M5416 Radiculopathy, lumbar region: Secondary | ICD-10-CM | POA: Insufficient documentation

## 2023-09-04 ENCOUNTER — Encounter: Payer: Self-pay | Admitting: Family Medicine

## 2023-09-08 ENCOUNTER — Other Ambulatory Visit: Payer: Self-pay | Admitting: Family Medicine

## 2023-09-08 DIAGNOSIS — M5416 Radiculopathy, lumbar region: Secondary | ICD-10-CM

## 2023-09-09 ENCOUNTER — Encounter: Payer: Self-pay | Admitting: Family Medicine

## 2023-09-11 ENCOUNTER — Other Ambulatory Visit: Payer: Self-pay | Admitting: Family Medicine

## 2023-09-11 MED ORDER — FLUTICASONE PROPIONATE HFA 110 MCG/ACT IN AERO: 1.0000 | INHALATION_SPRAY | Freq: Two times a day (BID) | RESPIRATORY_TRACT | 2 refills | Status: DC

## 2023-09-11 MED ORDER — ALBUTEROL SULFATE HFA 108 (90 BASE) MCG/ACT IN AERS: 2.0000 | INHALATION_SPRAY | Freq: Four times a day (QID) | RESPIRATORY_TRACT | 2 refills | Status: AC | PRN

## 2023-09-19 NOTE — Progress Notes (Unsigned)
Referring Physician:  Joaquim Nam, MD [address] [place],  [place] [address]  Primary Physician:  Joaquim Nam, MD  History of Present Illness: 09/21/2023 Deborah Brandt is here today with a chief complaint of buttock pain bilaterally.  She has pain both at the top of her hips and deep in her lower part of her buttock.  This has been ongoing for over 20 years.  However at times it flares she is eager to have a solution.  She states that it has been over a decade since she underwent an injection for this pain.  The previous injection years ago did help.  She is trying to avoid taking medication is much as possible.  She denies pain that radiates down her legs.  Pain increases when sitting for prolonged period of time.  She feels as though her buttock area is extremely tight and hard to stretch out.  She denies any bowel or bladder dysfunction.  She denies any saddle anesthesia.  Conservative measures:  Physical therapy: has not participated in PT  Multimodal medical therapy including regular antiinflammatories: Tylenol, Ibuprofen, Gabapentin, Prednisone  Injections: has received epidural steroid injections in the past injections helped  Past Surgery: none  Deborah Brandt has no symptoms of cervical myelopathy.  The symptoms are causing a significant impact on the patient's life.   Review of Systems:  A 10 point review of systems is negative, except for the pertinent positives and negatives detailed in the HPI.  Past Medical History: Past Medical History:  Diagnosis Date   Complication of anesthesia    after laparoscopy(1988) BP dropped.  Low BP after epidurals for childbirth   Diverticulitis    Gestational diabetes 1990, 2001   Heart murmur    at birth. no recent mention   Hepatic steatosis    History of kidney stones    HLD (hyperlipidemia)    Migraine    Motion sickness    boats, back seat of car   Nephrolithiasis    Pneumonia    Sensorineural hearing  loss (SNHL) of left ear     Past Surgical History: Past Surgical History:  Procedure Laterality Date   BREAST BIOPSY Left 08/22/2017   neg, PREDOMINANTLY FATTY BREAST TISSUE   COLON RESECTION SIGMOID     COLONOSCOPY  2004   COLONOSCOPY WITH PROPOFOL N/A 12/02/2017   Procedure: COLONOSCOPY WITH PROPOFOL;  Surgeon: Midge Minium, MD;  Location[facility];  Service: Endoscopy;  Laterality: N/A;   DIAGNOSTIC LAPAROSCOPY  1988   checking for endometriosis but negative [facility] [place], [place]   LUMBAR EPIDURAL INJECTION  1990 x2   [facility] [place], [place]   POLYPECTOMY  12/02/2017   Procedure: POLYPECTOMY;  Surgeon: Midge Minium, MD;  Location: [facility];  Service: Endoscopy;;   SINUS EXPLORATION  1998   [place], [place]    TUBAL LIGATION     VAGINAL DELIVERY  1990   [facility] [place], [place]    Allergies: Allergies as of 09/21/2023 - Review Complete 09/21/2023  Allergen Reaction Noted   Amoxicillin Hives 09/12/2020   Ciprofloxacin Hives and Nausea And Vomiting 09/20/2017   Levaquin [levofloxacin] Hives 04/04/2015   Sulfa antibiotics Hives 04/04/2015   Augmentin [amoxicillin-pot clavulanate] Hives and Itching 09/20/2017   Codeine Nausea And Vomiting 04/04/2015   Doxycycline Hives 04/04/2015   Eucalyptus oil Swelling 11/28/2017   Keflex [cephalexin] Other (See Comments) 08/10/2019    Medications: Outpatient Encounter Medications as of 09/21/2023  Medication Sig   acetaminophen (TYLENOL) 500 MG tablet Take 1,000 mg by mouth every 6 (six) hours as needed for mild pain or moderate pain.   albuterol (VENTOLIN HFA) 108 (90 Base) MCG/ACT inhaler Inhale 2 puffs into the lungs every 6 (six) hours as needed for wheezing or shortness of breath.   ibuprofen (ADVIL) 400 MG tablet Take 400 mg by mouth every 6 (six) hours as needed.   [DISCONTINUED] fluticasone (FLOVENT HFA) 110 MCG/ACT inhaler Inhale 1 puff into the lungs in the  morning and at bedtime. Rinse after use   No facility-administered encounter medications on file as of 09/21/2023.    Social History: Social History   Tobacco Use   Smoking status: Never    Passive exposure: Never   Smokeless tobacco: Never  Vaping Use   Vaping status: Never Used  Substance Use Topics   Alcohol use: Yes    Comment: rarely   Drug use: No    Family Medical History: Family History  Problem Relation Age of Onset   Breast cancer Mother 31   Stroke Mother    Diabetes Father    Heart disease Father    Colon cancer Paternal Grandmother        dx'd at age 37   Diabetes Paternal Grandmother     Physical Examination: @VITALWITHPAIN @  General: Patient is well developed, well nourished, calm, collected, and in no apparent distress. Attention to examination is appropriate.  Psychiatric: Patient is non-anxious.  Head:  Pupils equal, round, and reactive to light.  ENT:  Oral mucosa appears well hydrated.  Neck:   Supple.  Full range of motion.  Respiratory: Patient is breathing without any difficulty.  Extremities: No edema.  Vascular: Palpable dorsal pedal pulses.  Skin:   On exposed skin, there are no abnormal skin lesions.  NEUROLOGICAL:     Awake, alert, oriented to person, place, and time.  Speech is clear and fluent. Fund of knowledge is appropriate.   Cranial Nerves: Pupils equal round and reactive to light.  Facial tone is symmetric.  Facial sensation is symmetric.   Patient with pinpoint tenderness to the superior insertion of her hamstrings bilaterally as well as to her SI joint.  Strength:  Side Iliopsoas Quads Hamstring PF DF EHL  R 5 5 5 5 5 5   L 5 5 5 5 5 5    Reflexes are 1+ and symmetric at the patella and achilles.    Clonus is not present.  Toes are down-going.  Bilateral lower extremity sensation is intact to light touch.    Gait is normal.   No difficulty with tandem gait.   No evidence of dysmetria noted.  Medical Decision  Making  Imaging: MRI Lumbar Spine (08/25/23):  IMPRESSION: 1. L2-3: Degeneration of the disc with desiccation and mild bulging. Slight indentation of the thecal sac but no neural compression. 2. L4-5: Minimal desiccation and bulging of the disc. Minimal facet hypertrophy. No stenosis. The facet arthritis could possibly contribute to low back pain. 3. L5-S1: Bilateral facet osteoarthritis without slippage or encroachment. This could contribute to low back pain.  I have personally reviewed the images and agree with the above interpretation.  Assessment and Plan: Ms. Manka is a pleasant 58 y.o. female is here today with a chief complaint of buttock pain bilaterally.  She has pain both at the top of her hips and deep in her lower part of her buttock.  This has been ongoing for over 20 years.  However at  times it flares she is eager to have a solution.  She states that it has been over a decade since she underwent an injection for this pain.  The previous injection years ago did help.  She is trying to avoid taking medication is much as possible.  She denies pain that radiates down her legs.  Pain increases when sitting for prolonged period of time.  She feels as though her buttock area is extremely tight and hard to stretch out.  She denies any bowel or bladder dysfunction.  She denies any saddle anesthesia.  On examination, Patient with pinpoint tenderness to the superior insertion of her hamstrings bilaterally as well as to her SI joint.  She has full strength.  No ankle clonus.  MRI of her lumbar spine reviewed which does show some degeneration without significant stenosis.  She does have some facet arthritis which could potentially be contributing to pain although less likely.  Concern for SI joint pain as well as high hamstring strain.  I do think she would benefit greatly from physical therapy and referral has been placed.  In addition, I would like her to see our pain medicine group for  evaluation for potential injections as she saw significant relief from these in the past.  She is encouraged reach out to me as needed.  Thank you for involving me in the care of this patient.   Joan Flores, PA-C Dept. of Neurosurgery

## 2023-09-21 ENCOUNTER — Ambulatory Visit (INDEPENDENT_AMBULATORY_CARE_PROVIDER_SITE_OTHER): Payer: 59 | Admitting: Physician Assistant

## 2023-09-21 ENCOUNTER — Ambulatory Visit (INDEPENDENT_AMBULATORY_CARE_PROVIDER_SITE_OTHER)
Admission: RE | Admit: 2023-09-21 | Discharge: 2023-09-21 | Disposition: A | Discharge status: Home / Self Care | Payer: 59 | Source: Ambulatory Visit | Attending: Nurse Practitioner

## 2023-09-21 ENCOUNTER — Encounter: Payer: Self-pay | Admitting: Physician Assistant

## 2023-09-21 ENCOUNTER — Ambulatory Visit: Payer: Self-pay | Admitting: Family Medicine

## 2023-09-21 ENCOUNTER — Ambulatory Visit: Payer: 59 | Admitting: Nurse Practitioner

## 2023-09-21 ENCOUNTER — Encounter: Payer: Self-pay | Admitting: Nurse Practitioner

## 2023-09-21 VITALS — BP 120/82 | HR 64 | Temp 98.2°F | Ht 60.0 in | Wt 206.4 lb

## 2023-09-21 VITALS — BP 138/80 | Ht 60.0 in | Wt 205.0 lb

## 2023-09-21 DIAGNOSIS — S76312A Strain of muscle, fascia and tendon of the posterior muscle group at thigh level, left thigh, initial encounter: Secondary | ICD-10-CM

## 2023-09-21 DIAGNOSIS — S76311A Strain of muscle, fascia and tendon of the posterior muscle group at thigh level, right thigh, initial encounter: Secondary | ICD-10-CM | POA: Diagnosis not present

## 2023-09-21 DIAGNOSIS — R0602 Shortness of breath: Secondary | ICD-10-CM

## 2023-09-21 DIAGNOSIS — G8929 Other chronic pain: Secondary | ICD-10-CM

## 2023-09-21 DIAGNOSIS — R059 Cough, unspecified: Secondary | ICD-10-CM

## 2023-09-21 DIAGNOSIS — M461 Sacroiliitis, not elsewhere classified: Secondary | ICD-10-CM

## 2023-09-21 DIAGNOSIS — S76319D Strain of muscle, fascia and tendon of the posterior muscle group at thigh level, unspecified thigh, subsequent encounter: Secondary | ICD-10-CM

## 2023-09-21 DIAGNOSIS — M7918 Myalgia, other site: Secondary | ICD-10-CM

## 2023-09-21 MED ORDER — FLUTICASONE PROPIONATE 50 MCG/ACT NA SUSP: 2.0000 | Freq: Every day | NASAL | 0 refills | Status: AC

## 2023-09-21 MED ORDER — PREDNISONE 20 MG PO TABS: ORAL_TABLET | ORAL | 0 refills | Status: AC

## 2023-09-21 NOTE — Progress Notes (Addendum)
Acute Office Visit  Subjective:     Patient ID: Deborah Brandt, female    DOB: 15-Dec-1965, 58 y.o.   MRN: [id number]  Chief Complaint  Patient presents with   Cough    Pt complains of having dry cough for about a month. Pt states of chest soreness from constant coughing. States she feels like she has pressure on her neck. Walking too far makes pt wheeze. States Albuterol usually helps but not this time.     Patient is in today for persistent dry cough for 1 month. On 1/1 pt started having cough, fever, fatigue x 1.5 weeks. Pt reports she tested negative for COVID-19 and flu. Pt had + sick contacts. Pt took Mucinex when she was acutely ill. Pt symptoms started to resolve, but was left with dry non-productive cough. Coughing keeps her awake at night. Pt has associated globus sensation in her throat and an achy mid-sternal CP that is worse with cough and without radiation.   Review of Systems  Constitutional:  Negative for chills, fever and weight loss.  Respiratory:  Positive for cough and shortness of breath. Negative for sputum production.   Cardiovascular:  Positive for chest pain. Negative for palpitations.  Gastrointestinal:  Negative for abdominal pain, nausea and vomiting.  Genitourinary:  Negative for dysuria.  Musculoskeletal:  Negative for myalgias.  Skin:  Negative for rash.  Neurological:  Negative for dizziness and headaches.        Objective:    BP 120/82   Pulse 64   Temp 98.2 F (36.8 C) (Oral)   Ht 5' (1.524 m)   Wt 93.6 kg   LMP 08/24/2015 (Approximate)   SpO2 95%   BMI 40.31 kg/m  BP Readings from Last 3 Encounters:  09/21/23 120/82  09/21/23 138/80  08/04/23 128/80   Wt Readings from Last 3 Encounters:  09/21/23 93.6 kg  09/21/23 93 kg  08/04/23 91.3 kg   SpO2 Readings from Last 3 Encounters:  09/21/23 95%  08/04/23 98%  07/19/23 97%      Physical Exam Constitutional:      Appearance: Normal appearance.  HENT:     Right Ear: Tympanic  membrane, ear canal and external ear normal.     Left Ear: Tympanic membrane, ear canal and external ear normal.     Nose: Nose normal.     Mouth/Throat:     Mouth: Mucous membranes are moist.     Pharynx: Oropharynx is clear.  Cardiovascular:     Rate and Rhythm: Normal rate and regular rhythm.     Heart sounds: Normal heart sounds. No murmur heard.    No friction rub. No gallop.  Pulmonary:     Effort: Pulmonary effort is normal.     Breath sounds: Normal breath sounds.  Musculoskeletal:     Cervical back: Neck supple.     Right lower leg: No edema.     Left lower leg: No edema.  Lymphadenopathy:     Cervical: No cervical adenopathy.  Neurological:     General: No focal deficit present.     Mental Status: She is alert and oriented to person, place, and time.  Psychiatric:        Mood and Affect: Mood normal.        Behavior: Behavior normal.     No results found for any visits on 09/21/23.      Assessment & Plan:   Problem List Items Addressed This Visit     Cough -  Primary   Likely post viral cough. Other differentials include but not limited to pneumonia, asthma, COPD, cardiac etiologies, and neoplasm. Prednisone taper 20 mg. Flonase 50 mcg/act nasal spray. CXR pending. Return precautions given.       Relevant Medications   fluticasone (FLONASE) 50 MCG/ACT nasal spray   predniSONE (DELTASONE) 20 MG tablet   Other Relevant Orders   DG Chest 2 View   Shortness of breath   Prednisone taper 20 mg. Flonase 50 mcg/act nasal spray. CXR pending.       Relevant Medications   predniSONE (DELTASONE) 20 MG tablet   Other Relevant Orders   DG Chest 2 View    Meds ordered this encounter  Medications   fluticasone (FLONASE) 50 MCG/ACT nasal spray    Sig: Place 2 sprays into both nostrils daily.    Dispense:  16 g    Refill:  0   predniSONE (DELTASONE) 20 MG tablet    Sig: Take 2 tablets (40 mg total) by mouth daily with breakfast for 3 days, THEN 1 tablet (20 mg  total) daily with breakfast for 3 days. Avoid NSAIDs.    Dispense:  9 tablet    Refill:  0    Return if symptoms worsen or fail to improve.  Murvin Donning, RN

## 2023-09-21 NOTE — Patient Instructions (Signed)
It was a pleasure seeing you today.  We will follow up with you once your chest x-ray results.  A prescription for prednisone was sent to your pharmacy. Take with food and make sure you complete the full course.  If you develop worsening chest pain or shortness of breath please be seen immediately. Follow up if you do not improve.

## 2023-09-21 NOTE — Telephone Encounter (Signed)
Noted. Thanks.

## 2023-09-21 NOTE — Telephone Encounter (Signed)
Noted will evaluate in office

## 2023-09-21 NOTE — Telephone Encounter (Signed)
Daffeney RN with [facility] called and said pt declined disposition of ED.I spoke with pt; stating 4 wks ago pt started with dry cough; now dry hacky cough, wheeze with exertion,some SOB and constant tightness in chest. Pt denies fever. And has not had covid or flu testing. Pt said she is not bad enough to go to ED or UC. I offered pt multiple appts at [facility] this morning but pt has a Pt has NP appt with neurosurgery this morning at 11 AM and pt asked me to call neuro [phone] to see if time could be changed for later today. I spoke with Patty at neurosurgery and next available appt there would be 2 wks.pt declined changing neuro appt. And Audria Nine NP had cancellation and pt scheduled to see [facility] 09/21/23 at 2 pm; pt will be at [facility] at 1:45 with UC & ED precautions and pt voiced understanding and appreciative of appt., sending note to Audria Nine NP.

## 2023-09-21 NOTE — Patient Instructions (Signed)
LOCAL PHYSICAL THERAPY  [facility]  [address]  [place], [place] [address]  [phone]  [facility]  [address] [place], [place] [address]  [phone]  [facility] (2 locations)  [address]  [place], [place] [address]  [phone]          or  [address]  [place], [place] [address]  [phone]  [facility]  [address]  [place], [place] [address]  [phone]  **dry needling**  [facility] [place] [facility]  [address]  [place], [place] [address]  [phone]  Fax: [phone]  ** Aquatic therapy[address] [place], [place] [address] [phone] **Aquatic therapy**  [facility]  [facility]  [address]  [place], [place] [address]  [phone]  [facility]  [address]  [place], [place] [address]  [phone]  [facility] Therapy  [address]   Janora Norlander  [place], [place] [address]  [phone]  [facility]  [address]  [place], [place] [address]  [phone]  **dry needling**   PELVIC FLOOR/SI JOINT  [facility]  Deborah Brandt, PT  [email or web address]   [facility]  [address]  [place], [place] [address]  [phone]   [facility]  [address], [place] [address]  [phone]   [facility], [place]  [facility]  Deborah Brandt, PT  [address]  [place], [place] [address] [phone]   Deborah Brandt  [facility]  [address]  [phone]   [facility]  [address]  [phone]   [facility]  Deborah Brandt [facility]  [address]  [place], [place] [address]  [phone]   **No Aetna or medicaid**  [facility]  [phone] [address] [place] [address]  [phone]  [facility]  [address]  [place], [place] [address]  [phone]  Deborah Brandt, DC  [address], [place] [address]  [phone]  [facility]  [address], [place] [address]  [phone]  [facility]  [address]  [place], [place] [address]  [phone]  [facility]  (3 locations)  [address]  [place], [place] [address]  [phone]  **dry needling**           or  [address] [place], [place] [address]  [phone]  **Additionally has Gloris [place], OT**           or  [address]  [place], [place] [address]  [phone]  **Pediatric therapy**  [facility]  [address]  [phone]  **dry needlingVerdie [facility]t [facility]  [address]  [place], [place] [address]  [phone]  [facility]   (Inside [address] Fitness)  [address]  [place], [place] [address]  [phone]  **dry needling**  **MEDICAID or UNINSURED** [facility] dept. Of Physical Therapy [place], [place] [address] [phone]  [facility]  [address] [place], [place] [address]  [phone]   [facility]  [address]  [place], [place] [address]  [phone]   Deborah Brandt  [facility]  [address] [place], [place] [address]  [phone]   [facility]  [address]  [place], [place] [address]  [phone]   [facility]  [address] [place], [place] [address]  [phone]  [facility]  [address]  [place], [place] [address]  [phone]  AQUATIC  [facility]  [place] [facility]  [facility]  [place]  *Residents only*   [facility] [employer]  *[facility]  Exercise class  [facility]  Exercise class  [facility]  [address]  [place], [place]   [phone]  [facility]  [address]  [place], [place] [address]  [phone]   [place], [place]  [facility]  [address]  [phone]   [facility]  [facility]  [address]  [phone]           or  [address]  [phone]   [facility] Support [facility]   Provides education and support and practical information for coping with arthritis for arthritis sufferers and their families.   When: 12:15 - 1:30 p.m. the second Monday of each month, March through December  Info: Call Rehabilitation Services at [phone]

## 2023-09-21 NOTE — Progress Notes (Addendum)
Acute Office Visit  Subjective:     Patient ID: Deborah Brandt, female    DOB: 1966/01/18, 58 y.o.   MRN: [id number]  Chief Complaint  Patient presents with   Cough    Pt complains of having dry cough for about a month. Pt states of chest soreness from constant coughing. States she feels like she has pressure on her neck. Walking too far makes pt wheeze. States Albuterol usually helps but not this time.      Patient is in today for cough with a history of morbid obesity.  Rest of the medical history is noncontributory  Symptoms startred on 08/24/2023. She was exposed to sick contacts and was tested for flu and covid that was negative. She improved but the cough never resolved. She states that it was worse in afternoons. She states that she feels like there is something in the bottom of her throat. She does have an albuterol inhaler that she has tried without relief.  When she was acutely ill she did use mucinex. She has not had this in the past.  Looking back she was seen 11/19/2021.    Review of Systems  Constitutional:  Negative for chills and fever.  HENT:  Positive for sore throat. Negative for ear discharge and ear pain.   Respiratory:  Positive for cough and shortness of breath. Negative for sputum production.   Cardiovascular:  Negative for chest pain.        Objective:    BP 120/82   Pulse 64   Temp 98.2 F (36.8 C) (Oral)   Ht 5' (1.524 m)   Wt 206 lb 6.4 oz (93.6 kg)   LMP 08/24/2015 (Approximate)   SpO2 95%   BMI 40.31 kg/m    Physical Exam Vitals and nursing note reviewed.  Constitutional:      Appearance: Normal appearance.  HENT:     Right Ear: Tympanic membrane, ear canal and external ear normal.     Left Ear: Tympanic membrane, ear canal and external ear normal.     Nose:     Right Sinus: No maxillary sinus tenderness or frontal sinus tenderness.     Left Sinus: No maxillary sinus tenderness or frontal sinus tenderness.     Mouth/Throat:      Mouth: Mucous membranes are moist.     Pharynx: Oropharynx is clear.  Cardiovascular:     Rate and Rhythm: Normal rate and regular rhythm.     Heart sounds: Normal heart sounds.  Pulmonary:     Effort: Pulmonary effort is normal.     Breath sounds: Normal breath sounds.  Lymphadenopathy:     Cervical: No cervical adenopathy.  Neurological:     Mental Status: She is alert.     No results found for any visits on 09/21/23.      Assessment & Plan:   Problem List Items Addressed This Visit       Other   Cough - Primary   Likely post viral cough. Other differentials include but not limited to pneumonia, asthma, COPD, cardiac etiologies, and neoplasm. Prednisone taper 20 mg. Flonase 50 mcg/act nasal spray. CXR pending. Return precautions given.   I evaluated patient, was consulted regarding treatment, and agree with assessment and plan per Denice Bors RN, FNP Student   Audria Nine, DNP, AGNP-C       Relevant Medications   fluticasone (FLONASE) 50 MCG/ACT nasal spray   predniSONE (DELTASONE) 20 MG tablet   Other Relevant Orders  DG Chest 2 View   Shortness of breath   Prednisone taper 20 mg. Flonase 50 mcg/act nasal spray. CXR pending. Albuterol inhaler PRN  I evaluated patient, was consulted regarding treatment, and agree with assessment and plan per Denice Bors RN, FNP Student   Audria Nine, DNP, AGNP-C       Relevant Medications   predniSONE (DELTASONE) 20 MG tablet   Other Relevant Orders   DG Chest 2 View    Meds ordered this encounter  Medications   fluticasone (FLONASE) 50 MCG/ACT nasal spray    Sig: Place 2 sprays into both nostrils daily.    Dispense:  16 g    Refill:  0   predniSONE (DELTASONE) 20 MG tablet    Sig: Take 2 tablets (40 mg total) by mouth daily with breakfast for 3 days, THEN 1 tablet (20 mg total) daily with breakfast for 3 days. Avoid NSAIDs.    Dispense:  9 tablet    Refill:  0    Return if symptoms worsen or fail to improve.  Audria Nine, NP

## 2023-09-21 NOTE — Telephone Encounter (Signed)
  Chief Complaint: SOB, cough Symptoms: dry, barking cough; fatigue, tightness in chest, wheezing with exertion, moderate SOB Frequency: x 4 weeks, constant Pertinent Negatives: Patient denies runny nose Disposition: [x] ED /[] Urgent Care (no appt availability in office) / [] Appointment(In office/virtual)/ []  [facility]/ [] Home Care/ [x] Refused Recommended Disposition /[] [facility]/ []  Follow-up with PCP Additional Notes: Patient contacted PCP and requested inhalers. She states she did not receive the flovent and has just been using the albuterol without improvement. Patient refused ED disposition and states this happens for her normally with respiratory infections where it lingers. Patient requesting appointment with PCP. Called [facility] line and spoke with RN Rena and aware of patient's ED refusal. Reason for Disposition  [1] MODERATE difficulty breathing (e.g., speaks in phrases, SOB even at rest, pulse 100-120) AND [2] still present when not coughing  Answer Assessment - Initial Assessment Questions 1. ONSET: "When did the cough begin?"      X 4 weeks.  2. SEVERITY: "How bad is the cough today?"      Worse at night and first thing in the morning. Moderate. Barking cough.  3. SPUTUM: "Describe the color of your sputum" (none, dry cough; clear, white, yellow, green)     Denies.  4. HEMOPTYSIS: "Are you coughing up any blood?" If so ask: "How much?" (flecks, streaks, tablespoons, etc.)     Denies.  5. DIFFICULTY BREATHING: "Are you having difficulty breathing?" If Yes, ask: "How bad is it?" (e.g., mild, moderate, severe)    - MILD: No SOB at rest, mild SOB with walking, speaks normally in sentences, can lie down, no retractions, pulse < 100.    - MODERATE: SOB at rest, SOB with minimal exertion and prefers to sit, cannot lie down flat, speaks in phrases, mild retractions, audible wheezing, pulse 100-120.    - SEVERE: Very SOB at rest, speaks in single words, struggling  to breathe, sitting hunched forward, retractions, pulse > 120      Moderate.  6. FEVER: "Do you have a fever?" If Yes, ask: "What is your temperature, how was it measured, and when did it start?"     Denies. She states she did weeks ago.  7. CARDIAC HISTORY: "Do you have any history of heart disease?" (e.g., heart attack, congestive heart failure)      Denies.  8. LUNG HISTORY: "Do you have any history of lung disease?"  (e.g., pulmonary embolus, asthma, emphysema)     Denies.  9. PE RISK FACTORS: "Do you have a history of blood clots?" (or: recent major surgery, recent prolonged travel, bedridden)     Denies.  10. OTHER SYMPTOMS: "Do you have any other symptoms?" (e.g., runny nose, wheezing, chest pain)       Tightness in chest (all the time), fatigue, wheezing with exertion  11. TRAVEL: "Have you traveled out of the country in the last month?" (e.g., travel history, exposures)       Denies travel, states she has been around everybody that is sick.  Protocols used: Cough - Acute Non-Productive-A-AH

## 2023-09-21 NOTE — Assessment & Plan Note (Addendum)
Likely post viral cough. Other differentials include but not limited to pneumonia, asthma, COPD, cardiac etiologies, and neoplasm. Prednisone taper 20 mg. Flonase 50 mcg/act nasal spray. CXR pending. Return precautions given.   I evaluated patient, was consulted regarding treatment, and agree with assessment and plan per Denice Bors RN, FNP Student   Audria Nine, DNP, AGNP-C

## 2023-09-21 NOTE — Assessment & Plan Note (Addendum)
Prednisone taper 20 mg. Flonase 50 mcg/act nasal spray. CXR pending. Albuterol inhaler PRN  I evaluated patient, was consulted regarding treatment, and agree with assessment and plan per Denice Bors RN, FNP Student   Audria Nine, DNP, AGNP-C

## 2023-09-22 ENCOUNTER — Encounter: Payer: Self-pay | Admitting: Nurse Practitioner

## 2023-10-25 ENCOUNTER — Ambulatory Visit: Payer: 59 | Admitting: Physical Therapy

## 2023-10-27 ENCOUNTER — Ambulatory Visit: Payer: 59 | Admitting: Physical Therapy

## 2023-10-31 NOTE — Therapy (Unsigned)
 OUTPATIENT PHYSICAL THERAPY THORACOLUMBAR EVALUATION   Patient Name: Deborah Brandt MRN: [id number] DOB:1966/07/17, 58 y.o., female Today's Date: 11/01/2023  END OF SESSION:  PT End of Session - 11/03/23 0558     Visit Number 1    Number of Visits 13    Date for PT Re-Evaluation 12/15/23    PT Start Time 0730    PT Stop Time 0811    PT Time Calculation (min) 41 min    Activity Tolerance Patient tolerated treatment well    Behavior During Therapy [facility] for tasks assessed/performed              Past Medical History:  Diagnosis Date   Complication of anesthesia    after laparoscopy(1988) BP dropped.  Low BP after epidurals for childbirth   Diverticulitis    Gestational diabetes 1990, 2001   Heart murmur    at birth. no recent mention   Hepatic steatosis    History of kidney stones    HLD (hyperlipidemia)    Migraine    Motion sickness    boats, back seat of car   Nephrolithiasis    Pneumonia    Sensorineural hearing loss (SNHL) of left ear    Past Surgical History:  Procedure Laterality Date   BREAST BIOPSY Left 08/22/2017   neg, PREDOMINANTLY FATTY BREAST TISSUE   COLON RESECTION SIGMOID     COLONOSCOPY  2004   COLONOSCOPY WITH PROPOFOL N/A 12/02/2017   Procedure: COLONOSCOPY WITH PROPOFOL;  Surgeon: Midge Minium, MD;  Location: [facility];  Service: Endoscopy;  Laterality: N/A;   DIAGNOSTIC LAPAROSCOPY  1988   checking for endometriosis but negative [facility] [place], [place]   LUMBAR EPIDURAL INJECTION  1990 x2   [facility] [place], [place]   POLYPECTOMY  12/02/2017   Procedure: POLYPECTOMY;  Surgeon: Midge Minium, MD;  Location: [facility];  Service: Endoscopy;;   SINUS EXPLORATION  1998   [place], [place]    TUBAL LIGATION     VAGINAL DELIVERY  1990   [facility] [place], [place]   Patient Active Problem List   Diagnosis Date Noted   Shortness of breath 09/21/2023   Lumbar radiculopathy 07/20/2023    Hip pain 08/04/2022   Shingles 04/21/2021   S/P laparoscopic-assisted sigmoidectomy 12/23/2020   Heart murmur 11/24/2020   Hyperglycemia 04/14/2020   Hearing loss 08/08/2019   Thumb pain 07/26/2018   Rash 07/26/2018   Cough 07/26/2018   Nasal obstruction 04/02/2018   Toe pain 04/02/2018   Obesity, morbid, BMI 40.0-49.9 (HCC) 04/02/2018   HLD (hyperlipidemia) 04/02/2018   Special screening for malignant neoplasms, colon    Rectal polyp    Drug-induced urticaria 09/20/2017   Routine general medical examination at a health care facility 02/08/2017   Abdominal pain, LLQ 01/21/2017   History of migraine 11/24/2016   Renal stones 11/24/2016   Diverticulitis 11/24/2016   Menopause 11/24/2016   Advance care planning 11/24/2016    PCP: Joaquim Nam, MD  REFERRING PROVIDER: Joan Flores, PA-C  REFERRING DIAG:  [phone] (ICD-10-CM) - Buttock pain  M54.50,G89.29 (ICD-10-CM) - Chronic midline low back pain without sciatica    RATIONALE FOR EVALUATION AND TREATMENT: Rehabilitation  THERAPY DIAG: Bilateral hip pain  Other low back pain  Difficulty in walking, not elsewhere classified  Muscle weakness (generalized)  ONSET DATE: 30 years + of episodic low back pain   FOLLOW-UP APPT SCHEDULED WITH REFERRING PROVIDER: No ; no formal f/u scheduled   SUBJECTIVE:  SUBJECTIVE STATEMENT:  Pt is a 58 year old female with Hx of chronic episodic pain in bilateral buttock and low back  PERTINENT HISTORY: Pt is a 58 year old female with Hx of chronic episodic pain in bilateral buttock and low back. Patient reports pain in buttocks. Pt reports fear of falling secondary to pain. Pt is having cortisone shot in April. She wants to work in garden, but she is unable to at this time due to pain. Patient reports no  numbness/tingling/paresthesias. Patient reports difficulty sleeping - this is unusual for her. Pt reports notable sensation of tightness in glutes. Pt is getting married 12/11/23.   PAIN:    Pain Intensity: Present: 8/10, Best: 3/10, Worst: 10/10 Pain location: Gluteal region L>R with pain down to ischial region Pain Quality: burning, dull, and aching  Radiating: No  Numbness/Tingling: No Focal Weakness: No Aggravating factors: picking up items from ground, lifting boxes up/down Relieving factors: heating pad (not sure if it helps) 24-hour pain behavior: worse in evening/PM, notable pain in AM and difficulty with bending to turn off her fan  History of prior back injury, pain, surgery, or therapy: Yes; longstanding back pain, Hx of epidurals with both pregnancies  Imaging: Yes ;  Narrative & Impression  CLINICAL DATA:  Lumbar radiculopathy. Symptoms persist with greater than 6 weeks of treatment. Low back pain over the last 30 years which is worsening over time   EXAM: MRI LUMBAR SPINE WITHOUT CONTRAST   TECHNIQUE: Multiplanar, multisequence MR imaging of the lumbar spine was performed. No intravenous contrast was administered.   COMPARISON:  Radiography 08/02/2022   FINDINGS: Segmentation:  5 lumbar type vertebral bodies.   Alignment:  Normal   Vertebrae:  No fracture or focal bone lesion.   Conus medullaris and cauda equina: Conus extends to the L1 level. Conus and cauda equina appear normal.   Paraspinal and other soft tissues: Negative   Disc levels:   T10-11: Bilateral facet degeneration. No compressive stenosis. No disc pathology.   T11-12 through L1-2: Normal   L2-3: Degeneration of the disc with desiccation and mild bulging. Slight indentation of the thecal sac but no neural compression. No edematous endplate marrow changes or facet arthritis.   L3-4: Minimal desiccation and bulging of the disc.  No stenosis.   L4-5: Minimal desiccation and bulging of the  disc. Minimal facet hypertrophy. No stenosis. The facet arthritis could possibly contribute to low back pain.   L5-S1: No disc abnormality. Bilateral facet osteoarthritis without slippage or encroachment. This could contribute to low back pain.   IMPRESSION: 1. L2-3: Degeneration of the disc with desiccation and mild bulging. Slight indentation of the thecal sac but no neural compression. 2. L4-5: Minimal desiccation and bulging of the disc. Minimal facet hypertrophy. No stenosis. The facet arthritis could possibly contribute to low back pain. 3. L5-S1: Bilateral facet osteoarthritis without slippage or encroachment. This could contribute to low back pain.    Red flags: Negative for bowel/bladder changes, saddle paresthesia, personal history of cancer, h/o spinal tumors, h/o compression fx, h/o abdominal aneurysm, abdominal pain, chills/fever, night sweats, nausea, vomiting, unrelenting pain, first onset of insidious LBP <20 y/o  -Night pain    PRECAUTIONS: None  WEIGHT BEARING RESTRICTIONS: No  FALLS: Has patient fallen in last 6 months? No  Living Environment Lives with:  lives with fiance Lives in: House/apartment Stairs: Yes: Internal: 2 steps; none 2 steps down to den. 4 steps in. Notable difficulty with stair negotiation.  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 Mostly standing, moving items. Lifts up to 30 lbs.   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  Recent memory is intact.  Remote memory is intact.  Attention span and concentration are intact.  Expressive speech is intact.  Patient's fund of knowledge is within  normal limits for educational level.    Gross Musculoskeletal Assessment Tremor: None Bulk: Normal Tone: Normal No visible step-off along spinal column, no signs of scoliosis  GAIT: Distance walked: 40 ft  Assistive device utilized: None Level of assistance: SBA Comments: Wide BOS, ipsilateral thoracolumbar sidebend during stance phase of gait, mild pelvic drop bilat  Posture: Lumbar lordosis: Decreased Iliac crest height: Equal bilaterally Lumbar lateral shift: Negative  AROM AROM (Normal range in degrees) AROM  11/01/23  Lumbar   Flexion (65) 50%*  Extension (30) 50% (not hurting, "stiff")  Right lateral flexion (25) WNL  Left lateral flexion (25) WNL (slower to access ROM)*  Right rotation (30) WNL  Left rotation (30) WNL      Hip Right Left  Flexion (125) 100* 120  Extension (15)    Abduction (40) 40   Adduction     Internal Rotation (45) 20*   External Rotation (45) WNL*       (* = pain; Blank rows = not tested)  LE MMT: MMT (out of 5) Right 11/01/23 Left 11/01/23  Hip flexion 5 4  Hip extension    Hip abduction 4-* 4-  Hip adduction 5 5  Hip internal rotation    Hip external rotation    Knee flexion 5 5  Knee extension 5 5  Ankle dorsiflexion    Ankle plantarflexion    Ankle inversion    Ankle eversion    (* = pain; Blank rows = not tested)  Sensation Deferred  Reflexes Deferred  Muscle Length Hamstrings: R: Positive L: Positive [place] (quadriceps): R: Not examined L: Not examined   Palpation Location Right Left         Lumbar paraspinals    Quadratus Lumborum 0   Gluteus Maximus 1   Gluteus Medius 2   Deep hip external rotators 2   PSIS    Fortin's Area (SIJ) 2   Greater Trochanter    (Blank rows = not tested) Graded on 0-4 scale (0 = no pain, 1 = pain, 2 = pain with wincing/grimacing/flinching, 3 = pain with withdrawal, 4 = unwilling to allow palpation)  Passive Accessory Intervertebral Motion Pt denies reproduction of back pain  with CPA L1-L5 and UPA bilaterally L1-L5. Generally, hypomobile throughout  Special Tests Lumbar Radiculopathy and Discogenic: Centralization and Peripheralization (SN 92, -LR 0.12): Not examined Slump (SN 83, -LR 0.32): R: Negative L: Positive SLR (SN 92, -LR 0.29): R: Negative L:  Negative Crossed SLR (SP 90): R: Negative L: Negative General lumbar traction: Moderate relief of gluteal pain  Facet Joint: Extension-Rotation (SN 100, -LR 0.0): R: Negative L: Negative  Lumbar Foraminal Stenosis: Lumbar quadrant (SN 70): R: Negative L: Negative  Hip: FABER (SN 81): R: Negative L: Positive FADIR (SN 94): R: Not examined L: Not examined Hip scour (SN 50): R: Negative L: Positive  SIJ:  Thigh Thrust (SN 88, -  LR 0.18) : R: Negative L: Positive Anterior Primary Stress: Positive Posterior Primary Stress: Positive      TODAY'S TREATMENT: DATE: 11/01/23     Therapeutic Exercise - for HEP establishment, discussion on appropriate exercise/activity modification, PT education   Reviewed baseline home exercises and provided handout for MedBridge program (see Access Code); tactile cueing and therapist demonstration utilized as needed for carryover of proper technique to HEP.    Patient education on current condition, anatomy involved, prognosis, plan of care. Discussion on activity modification to prevent flare-up of condition, including modifying technique to retrieve items from floor, avoidance of repeated bending as able.    PATIENT EDUCATION:  Education details: see above for patient education details Person educated: Patient Education method: Explanation, Demonstration, and Handouts Education comprehension: verbalized understanding and returned demonstration   HOME EXERCISE PROGRAM:  Access Code: [email or web address] URL: [email or web address] Date: 11/03/2023 Prepared by: Consuela Mimes  Exercises - Hooklying Lumbar Traction  - 2 x daily - 7 x weekly - 10 reps -  5-10sec hold - Supine Piriformis Stretch with Foot on Ground  - 2 x daily - 7 x weekly - 3 sets - 30sec hold - Supine Hamstring Stretch  - 2 x daily - 7 x weekly - 2 sets - 10 reps - 1sec hold   ASSESSMENT:  CLINICAL IMPRESSION: Patient is a 58 y.o. female who was seen today for physical therapy evaluation and treatment for L>R buttock pain. Pt has pain down to gluteal fold without c/o radiating LE pain or numbness/tingling. Negative SLR/cross SLR. Cannot rule out SIJ pain. Pt has reproduction of pain with palpation of gluteus medius/minimus and deep hip external rotators. Moderate relief with use of traction. Current deficits in thoracolumbar flexion/extension and L lateral flexion ROM, gait changes, gluteal strength, and taut/tender gluteal and deep hip ER mm. Pt will continue to benefit from skilled PT services to address deficits and improve function.   OBJECTIVE IMPAIRMENTS: Abnormal gait, difficulty walking, decreased ROM, decreased strength, impaired flexibility, and pain.   ACTIVITY LIMITATIONS: carrying, lifting, bending, squatting, sleeping, stairs, transfers, and bed mobility  PARTICIPATION LIMITATIONS: meal prep, cleaning, laundry, shopping, community activity, and occupation  PERSONAL FACTORS: Past/current experiences, Time since onset of injury/illness/exacerbation, and 1-2+ comorbidities: (HLD, migraine disorder) are also affecting patient's functional outcome.   REHAB POTENTIAL: Good  CLINICAL DECISION MAKING: Unstable/unpredictable  EVALUATION COMPLEXITY: High   GOALS: Goals reviewed with patient? Yes  SHORT TERM GOALS: Target date: 11/24/2023  Pt will be independent with HEP in order to improve strength and decrease back pain to improve pain-free function at home and work. Baseline: 11/01/23: Baseline HEP initiated/MedBridge handout given.  Goal status: INITIAL   LONG TERM GOALS: Target date: 12/15/2023  Patient will have full thoracolumbar AROM without  reproduction of pain as needed for reaching items on ground, household chores, bending. Baseline: 11/01/22: Pain and motion loss with flexion and extension, pain with L lateral flexion  Goal status: INITIAL  2.  Pt will decrease worst back pain by at least 2 points on the NPRS in order to demonstrate clinically significant reduction in back pain. Baseline: 11/01/22: 10/10 pain at worst Goal status: INITIAL  3.  Pt will decrease mODI score by at least 13 points in order demonstrate clinically significant reduction in back pain/disability.       Baseline: 11/01/22: 20/50 Goal status: INITIAL  4.  Patient will demonstrate box lift with 30 lbs from floor to waist without increase in pain > 1-2/10 and proper body mechanics  indicative of ability to maintain physical demands of her work.  Baseline: 11/01/22: Notable pain with lifting, reaching to floor.  Goal status: INITIAL   PLAN: PT FREQUENCY: 1-2x/week  PT DURATION: 6 weeks  PLANNED INTERVENTIONS: Therapeutic exercises, Therapeutic activity, Neuromuscular re-education, Balance training, Gait training, Patient/Family education, Self Care, Joint mobilization, Joint manipulation, Vestibular training, Canalith repositioning, Orthotic/Fit training, DME instructions, Dry Needling, Electrical stimulation, Spinal manipulation, Spinal mobilization, Cryotherapy, Moist heat, Taping, Traction, Ultrasound, Ionotophoresis 4mg /ml Dexamethasone, Manual therapy, and Re-evaluation.  PLAN FOR NEXT SESSION: Begin manual therapy with traction/distraction techniques, STM for gluteal mm and consideration of dry needling. Initiate isometrics and gluteal strengthening/trunk stabilization as tolerated.     Consuela Mimes, PT, DPT #[id number]  Gertie Exon, PT 11/03/2023, 5:58 AM

## 2023-11-01 ENCOUNTER — Ambulatory Visit: Payer: 59 | Attending: Physician Assistant | Admitting: Physical Therapy

## 2023-11-01 ENCOUNTER — Encounter: Payer: Self-pay | Admitting: Physical Therapy

## 2023-11-01 DIAGNOSIS — M7918 Myalgia, other site: Secondary | ICD-10-CM | POA: Diagnosis not present

## 2023-11-01 DIAGNOSIS — M545 Low back pain, unspecified: Secondary | ICD-10-CM | POA: Insufficient documentation

## 2023-11-01 DIAGNOSIS — R262 Difficulty in walking, not elsewhere classified: Secondary | ICD-10-CM | POA: Insufficient documentation

## 2023-11-01 DIAGNOSIS — M25551 Pain in right hip: Secondary | ICD-10-CM | POA: Diagnosis present

## 2023-11-01 DIAGNOSIS — M5459 Other low back pain: Secondary | ICD-10-CM | POA: Insufficient documentation

## 2023-11-01 DIAGNOSIS — M25552 Pain in left hip: Secondary | ICD-10-CM | POA: Insufficient documentation

## 2023-11-01 DIAGNOSIS — M6281 Muscle weakness (generalized): Secondary | ICD-10-CM | POA: Insufficient documentation

## 2023-11-01 DIAGNOSIS — G8929 Other chronic pain: Secondary | ICD-10-CM | POA: Diagnosis not present

## 2023-11-03 ENCOUNTER — Ambulatory Visit: Payer: 59 | Admitting: Physical Therapy

## 2023-11-03 DIAGNOSIS — M6281 Muscle weakness (generalized): Secondary | ICD-10-CM

## 2023-11-03 DIAGNOSIS — M25551 Pain in right hip: Secondary | ICD-10-CM | POA: Diagnosis not present

## 2023-11-03 DIAGNOSIS — M5459 Other low back pain: Secondary | ICD-10-CM

## 2023-11-03 DIAGNOSIS — R262 Difficulty in walking, not elsewhere classified: Secondary | ICD-10-CM

## 2023-11-03 NOTE — Therapy (Signed)
 OUTPATIENT PHYSICAL THERAPY THORACOLUMBAR TREATMENT   Patient Name: Deborah Brandt MRN: [id number] DOB:1966-06-09, 58 y.o., female Today's Date: 11/03/2023  END OF SESSION:  PT End of Session - 11/07/23 0834     Visit Number 2    Number of Visits 13    Date for PT Re-Evaluation 12/15/23    PT Start Time 0730    PT Stop Time 0815    PT Time Calculation (min) 45 min    Activity Tolerance Patient tolerated treatment well    Behavior During Therapy [facility] for tasks assessed/performed              Past Medical History:  Diagnosis Date   Complication of anesthesia    after laparoscopy(1988) BP dropped.  Low BP after epidurals for childbirth   Diverticulitis    Gestational diabetes 1990, 2001   Heart murmur    at birth. no recent mention   Hepatic steatosis    History of kidney stones    HLD (hyperlipidemia)    Migraine    Motion sickness    boats, back seat of car   Nephrolithiasis    Pneumonia    Sensorineural hearing loss (SNHL) of left ear    Past Surgical History:  Procedure Laterality Date   BREAST BIOPSY Left 08/22/2017   neg, PREDOMINANTLY FATTY BREAST TISSUE   COLON RESECTION SIGMOID     COLONOSCOPY  2004   COLONOSCOPY WITH PROPOFOL N/A 12/02/2017   Procedure: COLONOSCOPY WITH PROPOFOL;  Surgeon: Midge Minium, MD;  Location: [facility];  Service: Endoscopy;  Laterality: N/A;   DIAGNOSTIC LAPAROSCOPY  1988   checking for endometriosis but negative [facility] [place], [place]   LUMBAR EPIDURAL INJECTION  1990 x2   [facility] [place], [place]   POLYPECTOMY  12/02/2017   Procedure: POLYPECTOMY;  Surgeon: Midge Minium, MD;  Location: [facility];  Service: Endoscopy;;   SINUS EXPLORATION  1998   [place], [place]    TUBAL LIGATION     VAGINAL DELIVERY  1990   [facility] [place], [place]   Patient Active Problem List   Diagnosis Date Noted   Shortness of breath 09/21/2023   Lumbar radiculopathy 07/20/2023    Hip pain 08/04/2022   Shingles 04/21/2021   S/P laparoscopic-assisted sigmoidectomy 12/23/2020   Heart murmur 11/24/2020   Hyperglycemia 04/14/2020   Hearing loss 08/08/2019   Thumb pain 07/26/2018   Rash 07/26/2018   Cough 07/26/2018   Nasal obstruction 04/02/2018   Toe pain 04/02/2018   Obesity, morbid, BMI 40.0-49.9 (HCC) 04/02/2018   HLD (hyperlipidemia) 04/02/2018   Special screening for malignant neoplasms, colon    Rectal polyp    Drug-induced urticaria 09/20/2017   Routine general medical examination at a health care facility 02/08/2017   Abdominal pain, LLQ 01/21/2017   History of migraine 11/24/2016   Renal stones 11/24/2016   Diverticulitis 11/24/2016   Menopause 11/24/2016   Advance care planning 11/24/2016    PCP: Joaquim Nam, MD  REFERRING PROVIDER: Joan Flores, PA-C  REFERRING DIAG:  [phone] (ICD-10-CM) - Buttock pain  M54.50,G89.29 (ICD-10-CM) - Chronic midline low back pain without sciatica    RATIONALE FOR EVALUATION AND TREATMENT: Rehabilitation  THERAPY DIAG: Bilateral hip pain  Other low back pain  Difficulty in walking, not elsewhere classified  Muscle weakness (generalized)  ONSET DATE: 30 years + of episodic low back pain   FOLLOW-UP APPT SCHEDULED WITH REFERRING PROVIDER: No ; no formal f/u scheduled  PERTINENT HISTORY: Pt is  a 58 year old female with Hx of chronic episodic pain in bilateral buttock and low back. Patient reports pain in buttocks. Pt reports fear of falling secondary to pain. Pt is having cortisone shot in April. She wants to work in garden, but she is unable to at this time due to pain. Patient reports no numbness/tingling/paresthesias. Patient reports difficulty sleeping - this is unusual for her. Pt reports notable sensation of tightness in glutes. Pt is getting married 12/11/23.   PAIN:    Pain Intensity: Present: 8/10, Best: 3/10, Worst: 10/10 Pain location: Gluteal region L>R with pain down to ischial  region Pain Quality: burning, dull, and aching  Radiating: No  Numbness/Tingling: No Focal Weakness: No Aggravating factors: picking up items from ground, lifting boxes up/down Relieving factors: heating pad (not sure if it helps) 24-hour pain behavior: worse in evening/PM, notable pain in AM and difficulty with bending to turn off her fan  History of prior back injury, pain, surgery, or therapy: Yes; longstanding back pain, Hx of epidurals with both pregnancies  Imaging: Yes ;  Narrative & Impression  CLINICAL DATA:  Lumbar radiculopathy. Symptoms persist with greater than 6 weeks of treatment. Low back pain over the last 30 years which is worsening over time   EXAM: MRI LUMBAR SPINE WITHOUT CONTRAST   TECHNIQUE: Multiplanar, multisequence MR imaging of the lumbar spine was performed. No intravenous contrast was administered.   COMPARISON:  Radiography 08/02/2022   FINDINGS: Segmentation:  5 lumbar type vertebral bodies.   Alignment:  Normal   Vertebrae:  No fracture or focal bone lesion.   Conus medullaris and cauda equina: Conus extends to the L1 level. Conus and cauda equina appear normal.   Paraspinal and other soft tissues: Negative   Disc levels:   T10-11: Bilateral facet degeneration. No compressive stenosis. No disc pathology.   T11-12 through L1-2: Normal   L2-3: Degeneration of the disc with desiccation and mild bulging. Slight indentation of the thecal sac but no neural compression. No edematous endplate marrow changes or facet arthritis.   L3-4: Minimal desiccation and bulging of the disc.  No stenosis.   L4-5: Minimal desiccation and bulging of the disc. Minimal facet hypertrophy. No stenosis. The facet arthritis could possibly contribute to low back pain.   L5-S1: No disc abnormality. Bilateral facet osteoarthritis without slippage or encroachment. This could contribute to low back pain.   IMPRESSION: 1. L2-3: Degeneration of the disc with  desiccation and mild bulging. Slight indentation of the thecal sac but no neural compression. 2. L4-5: Minimal desiccation and bulging of the disc. Minimal facet hypertrophy. No stenosis. The facet arthritis could possibly contribute to low back pain. 3. L5-S1: Bilateral facet osteoarthritis without slippage or encroachment. This could contribute to low back pain.    Red flags: Negative for bowel/bladder changes, saddle paresthesia, personal history of cancer, h/o spinal tumors, h/o compression fx, h/o abdominal aneurysm, abdominal pain, chills/fever, night sweats, nausea, vomiting, unrelenting pain, first onset of insidious LBP <20 y/o  -Night pain    PRECAUTIONS: None  WEIGHT BEARING RESTRICTIONS: No  FALLS: Has patient fallen in last 6 months? No  Living Environment Lives with:  lives with fiance Lives in: House/apartment Stairs: Yes: Internal: 2 steps; none 2 steps down to den. 4 steps in. Notable difficulty with stair negotiation.  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 Mostly standing, moving items. Lifts up  to 30 lbs.   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   Patient Goals: Able to move better without hurting significantly    OBJECTIVE (data from initial evaluation unless otherwise dated):    Patient Surveys  Modified Oswestry 20/50  = 40%  GAIT: Distance walked: 40 ft  Assistive device utilized: None Level of assistance: SBA Comments: Wide BOS, ipsilateral thoracolumbar sidebend during stance phase of gait, mild pelvic drop bilat   AROM AROM (Normal range in degrees) AROM  11/01/23  Lumbar   Flexion (65) 50%*  Extension (30) 50% (not hurting, "stiff")  Right lateral flexion (25) WNL  Left lateral flexion (25) WNL (slower to access ROM)*  Right rotation (30) WNL  Left rotation (30) WNL      Hip  Right Left  Flexion (125) 100* 120  Extension (15)    Abduction (40) 40   Adduction     Internal Rotation (45) 20*   External Rotation (45) WNL*       (* = pain; Blank rows = not tested)  LE MMT: MMT (out of 5) Right 11/01/23 Left 11/01/23  Hip flexion 5 4  Hip extension    Hip abduction 4-* 4-  Hip adduction 5 5  Hip internal rotation    Hip external rotation    Knee flexion 5 5  Knee extension 5 5  Ankle dorsiflexion    Ankle plantarflexion    Ankle inversion    Ankle eversion    (* = pain; Blank rows = not tested)  Muscle Length Hamstrings: R: Positive L: Positive E[place] (quadriceps): R: Not examined L: Not examined   Palpation Location Right Left         Lumbar paraspinals    Quadratus Lumborum 0   Gluteus Maximus 1   Gluteus Medius 2   Deep hip external rotators 2   PSIS    Fortin's Area (SIJ) 2   Greater Trochanter    (Blank rows = not tested) Graded on 0-4 scale (0 = no pain, 1 = pain, 2 = pain with wincing/grimacing/flinching, 3 = pain with withdrawal, 4 = unwilling to allow palpation)  Special Tests Lumbar Radiculopathy and Discogenic: Centralization and Peripheralization (SN 92, -LR 0.12): Not examined Slump (SN 83, -LR 0.32): R: Negative L: Positive SLR (SN 92, -LR 0.29): R: Negative L:  Negative Crossed SLR (SP 90): R: Negative L: Negative General lumbar traction: Moderate relief of gluteal pain  Facet Joint: Extension-Rotation (SN 100, -LR 0.0): R: Negative L: Negative  Lumbar Foraminal Stenosis: Lumbar quadrant (SN 70): R: Negative L: Negative  Hip: FABER (SN 81): R: Negative L: Positive FADIR (SN 94): R: Not examined L: Not examined Hip scour (SN 50): R: Negative L: Positive  SIJ:  Thigh Thrust (SN 88, -LR 0.18) : R: Negative L: Positive Anterior Primary Stress: Positive Posterior Primary Stress: Positive     TODAY'S TREATMENT: DATE: 11/03/23     SUBJECTIVE STATEMENT:   Pt reports 7/10 pain at arrival to clinic. Patient reports  pain along buttocks; more tightness on R side. Patient reports compliance with HEP.    MHP (unbilled) utilized prior to manual therapy for analgesic effect and improved soft tissue extensibility, along glutes and lower lumbar region in prone lying; x 5 minutes   Manual Therapy - for symptom modulation, soft tissue sensitivity and mobility, joint mobility, ROM   Manual lumbar traction, 10 sec intermittent holds; x 5 min  STM and IASTM with Hypervolt along R>L L4-S1  longissimus lumborum, emphasis on gluteal musculature; x 10 minutes   Trigger Point Dry Needling  Initial Treatment: Pt instructed on Dry Needling rational, procedures, and possible side effects. Pt instructed to expect mild to moderate muscle soreness later in the day and/or into the next day.  Pt instructed in methods to reduce muscle soreness. Pt instructed to continue prescribed HEP. Patient verbalized understanding of these instructions and education.   Patient Verbal Consent Given: Yes Education Handout Provided: Yes Muscles Treated: R gluteus maximus x 1 and R piriformis  x 1with 0.30 x 70 needles Electrical Stimulation Performed: No Treatment Response/Outcome: Mild post-treatment soreness     Therapeutic Exercise - for improved soft tissue flexibility and extensibility as needed for ROM, improved strength as needed to improve performance of CKC activities/functional movements  Lower trunk rotations; 1 x 10 alternating R/L Piriformis stretch; 2 x 30 sec, focus on RLE today Supine active hamstring stretch (nerve floss technique); reviewed  PATIENT EDUCATION: discussed expectations following dry needling/treatment today, continued HEP and activity modification.      PATIENT EDUCATION:  Education details: see above for patient education details Person educated: Patient Education method: Explanation, Demonstration, and Handouts Education comprehension: verbalized understanding and returned  demonstration   HOME EXERCISE PROGRAM:  Access Code: [email or web address] URL: [email or web address] Date: 11/03/2023 Prepared by: Consuela Mimes  Exercises - Hooklying Lumbar Traction  - 2 x daily - 7 x weekly - 10 reps - 5-10sec hold - Supine Piriformis Stretch with Foot on Ground  - 2 x daily - 7 x weekly - 3 sets - 30sec hold - Supine Hamstring Stretch  - 2 x daily - 7 x weekly - 2 sets - 10 reps - 1sec hold   ASSESSMENT:  CLINICAL IMPRESSION: Patient has relatively unchanged symptoms at baseline. Pt has largely R>L gluteal pain with pain down to gluteal fold with no LE radiating pain or paresthesias. We utilized dry needling today for symptom modulation in setting of persistent pain. Pt to continue with gluteal stretching at home as well as specific stretching exercises; self-traction prn. Patient has remaining deficits in thoracolumbar flexion/extension and L lateral flexion ROM, gait changes, gluteal strength, and taut/tender gluteal and deep hip ER mm. Pt will continue to benefit from skilled PT services to address deficits and improve function.   OBJECTIVE IMPAIRMENTS: Abnormal gait, difficulty walking, decreased ROM, decreased strength, impaired flexibility, and pain.   ACTIVITY LIMITATIONS: carrying, lifting, bending, squatting, sleeping, stairs, transfers, and bed mobility  PARTICIPATION LIMITATIONS: meal prep, cleaning, laundry, shopping, community activity, and occupation  PERSONAL FACTORS: Past/current experiences, Time since onset of injury/illness/exacerbation, and 1-2+ comorbidities: (HLD, migraine disorder) are also affecting patient's functional outcome.   REHAB POTENTIAL: Good  CLINICAL DECISION MAKING: Unstable/unpredictable  EVALUATION COMPLEXITY: High   GOALS: Goals reviewed with patient? Yes  SHORT TERM GOALS: Target date: 11/24/2023  Pt will be independent with HEP in order to improve strength and decrease back pain to improve pain-free function at  home and work. Baseline: 11/01/23: Baseline HEP initiated/MedBridge handout given.  Goal status: INITIAL   LONG TERM GOALS: Target date: 12/15/2023  Patient will have full thoracolumbar AROM without reproduction of pain as needed for reaching items on ground, household chores, bending. Baseline: 11/01/22: Pain and motion loss with flexion and extension, pain with L lateral flexion  Goal status: INITIAL  2.  Pt will decrease worst back pain by at least 2 points on the NPRS in order to demonstrate clinically significant reduction in back pain. Baseline: 11/01/22: 10/10  pain at worst Goal status: INITIAL  3.  Pt will decrease mODI score by at least 13 points in order demonstrate clinically significant reduction in back pain/disability.       Baseline: 11/01/22: 20/50 Goal status: INITIAL  4.  Patient will demonstrate box lift with 30 lbs from floor to waist without increase in pain > 1-2/10 and proper body mechanics indicative of ability to maintain physical demands of her work.  Baseline: 11/01/22: Notable pain with lifting, reaching to floor.  Goal status: INITIAL   PLAN: PT FREQUENCY: 1-2x/week  PT DURATION: 6 weeks  PLANNED INTERVENTIONS: Therapeutic exercises, Therapeutic activity, Neuromuscular re-education, Balance training, Gait training, Patient/Family education, Self Care, Joint mobilization, Joint manipulation, Vestibular training, Canalith repositioning, Orthotic/Fit training, DME instructions, Dry Needling, Electrical stimulation, Spinal manipulation, Spinal mobilization, Cryotherapy, Moist heat, Taping, Traction, Ultrasound, Ionotophoresis 4mg /ml Dexamethasone, Manual therapy, and Re-evaluation.  PLAN FOR NEXT SESSION: Traction/distraction techniques for symptom modulation, STM for gluteal mm; dry needling at future visits prn. Initiate isometrics and gluteal strengthening/trunk stabilization as tolerated.    Consuela Mimes, PT, DPT #[id number] Gertie Exon, PT 11/07/2023,  8:34 AM

## 2023-11-07 ENCOUNTER — Encounter: Payer: Self-pay | Admitting: Physical Therapy

## 2023-11-08 ENCOUNTER — Ambulatory Visit: Payer: 59 | Admitting: Physical Therapy

## 2023-11-08 ENCOUNTER — Encounter: Payer: Self-pay | Admitting: Physical Therapy

## 2023-11-08 DIAGNOSIS — M6281 Muscle weakness (generalized): Secondary | ICD-10-CM

## 2023-11-08 DIAGNOSIS — M25551 Pain in right hip: Secondary | ICD-10-CM | POA: Diagnosis not present

## 2023-11-08 DIAGNOSIS — R262 Difficulty in walking, not elsewhere classified: Secondary | ICD-10-CM

## 2023-11-08 DIAGNOSIS — M5459 Other low back pain: Secondary | ICD-10-CM

## 2023-11-08 NOTE — Therapy (Unsigned)
 OUTPATIENT PHYSICAL THERAPY THORACOLUMBAR TREATMENT   Patient Name: Deborah Brandt MRN: [id number] DOB:[birth year], 58 y.o., female Today's Date: 11/03/2023  END OF SESSION:  PT End of Session - 11/08/23 0731     Visit Number 3    Number of Visits 13    Date for PT Re-Evaluation 12/15/23    PT Start Time 0732    PT Stop Time 0813    PT Time Calculation (min) 41 min    Activity Tolerance Patient tolerated treatment well    Behavior During Therapy [facility] for tasks assessed/performed              Past Medical History:  Diagnosis Date   Complication of anesthesia    after laparoscopy(1988) BP dropped.  Low BP after epidurals for childbirth   Diverticulitis    Gestational diabetes 1990, 2001   Heart murmur    at birth. no recent mention   Hepatic steatosis    History of kidney stones    HLD (hyperlipidemia)    Migraine    Motion sickness    boats, back seat of car   Nephrolithiasis    Pneumonia    Sensorineural hearing loss (SNHL) of left ear    Past Surgical History:  Procedure Laterality Date   BREAST BIOPSY Left 08/22/2017   neg, PREDOMINANTLY FATTY BREAST TISSUE   COLON RESECTION SIGMOID     COLONOSCOPY  2004   COLONOSCOPY WITH PROPOFOL N/A 12/02/2017   Procedure: COLONOSCOPY WITH PROPOFOL;  Surgeon: Midge Minium, MD;  Location: [facility];  Service: Endoscopy;  Laterality: N/A;   DIAGNOSTIC LAPAROSCOPY  1988   checking for endometriosis but negative [facility] [place], [place]   LUMBAR EPIDURAL INJECTION  1990 x2   [facility] [place], [place]   POLYPECTOMY  12/02/2017   Procedure: POLYPECTOMY;  Surgeon: Midge Minium, MD;  Location: [facility];  Service: Endoscopy;;   SINUS EXPLORATION  1998   [place], [place]    TUBAL LIGATION     VAGINAL DELIVERY  1990   [facility] [place], [place]   Patient Active Problem List   Diagnosis Date Noted   Shortness of breath 09/21/2023   Lumbar radiculopathy 07/20/2023    Hip pain 08/04/2022   Shingles 04/21/2021   S/P laparoscopic-assisted sigmoidectomy 12/23/2020   Heart murmur 11/24/2020   Hyperglycemia 04/14/2020   Hearing loss 08/08/2019   Thumb pain 07/26/2018   Rash 07/26/2018   Cough 07/26/2018   Nasal obstruction 04/02/2018   Toe pain 04/02/2018   Obesity, morbid, BMI 40.0-49.9 (HCC) 04/02/2018   HLD (hyperlipidemia) 04/02/2018   Special screening for malignant neoplasms, colon    Rectal polyp    Drug-induced urticaria 09/20/2017   Routine general medical examination at a health care facility 02/08/2017   Abdominal pain, LLQ 01/21/2017   History of migraine 11/24/2016   Renal stones 11/24/2016   Diverticulitis 11/24/2016   Menopause 11/24/2016   Advance care planning 11/24/2016    PCP: Joaquim Nam, MD  REFERRING PROVIDER: Joan Flores, PA-C  REFERRING DIAG:  [phone] (ICD-10-CM) - Buttock pain  M54.50,G89.29 (ICD-10-CM) - Chronic midline low back pain without sciatica    RATIONALE FOR EVALUATION AND TREATMENT: Rehabilitation  THERAPY DIAG: Bilateral hip pain  Other low back pain  Difficulty in walking, not elsewhere classified  Muscle weakness (generalized)  ONSET DATE: 30 years + of episodic low back pain   FOLLOW-UP APPT SCHEDULED WITH REFERRING PROVIDER: No ; no formal f/u scheduled  PERTINENT HISTORY: Pt is  a 58 year old female with Hx of chronic episodic pain in bilateral buttock and low back. Patient reports pain in buttocks. Pt reports fear of falling secondary to pain. Pt is having cortisone shot in April. She wants to work in garden, but she is unable to at this time due to pain. Patient reports no numbness/tingling/paresthesias. Patient reports difficulty sleeping - this is unusual for her. Pt reports notable sensation of tightness in glutes. Pt is getting married 12/11/23.   PAIN:    Pain Intensity: Present: 8/10, Best: 3/10, Worst: 10/10 Pain location: Gluteal region L>R with pain down to ischial  region Pain Quality: burning, dull, and aching  Radiating: No  Numbness/Tingling: No Focal Weakness: No Aggravating factors: picking up items from ground, lifting boxes up/down Relieving factors: heating pad (not sure if it helps) 24-hour pain behavior: worse in evening/PM, notable pain in AM and difficulty with bending to turn off her fan  History of prior back injury, pain, surgery, or therapy: Yes; longstanding back pain, Hx of epidurals with both pregnancies  Imaging: Yes ;  Narrative & Impression  CLINICAL DATA:  Lumbar radiculopathy. Symptoms persist with greater than 6 weeks of treatment. Low back pain over the last 30 years which is worsening over time   EXAM: MRI LUMBAR SPINE WITHOUT CONTRAST   TECHNIQUE: Multiplanar, multisequence MR imaging of the lumbar spine was performed. No intravenous contrast was administered.   COMPARISON:  Radiography 08/02/2022   FINDINGS: Segmentation:  5 lumbar type vertebral bodies.   Alignment:  Normal   Vertebrae:  No fracture or focal bone lesion.   Conus medullaris and cauda equina: Conus extends to the L1 level. Conus and cauda equina appear normal.   Paraspinal and other soft tissues: Negative   Disc levels:   T10-11: Bilateral facet degeneration. No compressive stenosis. No disc pathology.   T11-12 through L1-2: Normal   L2-3: Degeneration of the disc with desiccation and mild bulging. Slight indentation of the thecal sac but no neural compression. No edematous endplate marrow changes or facet arthritis.   L3-4: Minimal desiccation and bulging of the disc.  No stenosis.   L4-5: Minimal desiccation and bulging of the disc. Minimal facet hypertrophy. No stenosis. The facet arthritis could possibly contribute to low back pain.   L5-S1: No disc abnormality. Bilateral facet osteoarthritis without slippage or encroachment. This could contribute to low back pain.   IMPRESSION: 1. L2-3: Degeneration of the disc with  desiccation and mild bulging. Slight indentation of the thecal sac but no neural compression. 2. L4-5: Minimal desiccation and bulging of the disc. Minimal facet hypertrophy. No stenosis. The facet arthritis could possibly contribute to low back pain. 3. L5-S1: Bilateral facet osteoarthritis without slippage or encroachment. This could contribute to low back pain.    Red flags: Negative for bowel/bladder changes, saddle paresthesia, personal history of cancer, h/o spinal tumors, h/o compression fx, h/o abdominal aneurysm, abdominal pain, chills/fever, night sweats, nausea, vomiting, unrelenting pain, first onset of insidious LBP <20 y/o  -Night pain    PRECAUTIONS: None  WEIGHT BEARING RESTRICTIONS: No  FALLS: Has patient fallen in last 6 months? No  Living Environment Lives with:  lives with fiance Lives in: House/apartment Stairs: Yes: Internal: 2 steps; none 2 steps down to den. 4 steps in. Notable difficulty with stair negotiation.  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 Mostly standing, moving items. Lifts up  to 30 lbs.   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   Patient Goals: Able to move better without hurting significantly    OBJECTIVE (data from initial evaluation unless otherwise dated):    Patient Surveys  Modified Oswestry 20/50  = 40%  GAIT: Distance walked: 40 ft  Assistive device utilized: None Level of assistance: SBA Comments: Wide BOS, ipsilateral thoracolumbar sidebend during stance phase of gait, mild pelvic drop bilat   AROM AROM (Normal range in degrees) AROM  11/01/23  Lumbar   Flexion (65) 50%*  Extension (30) 50% (not hurting, "stiff")  Right lateral flexion (25) WNL  Left lateral flexion (25) WNL (slower to access ROM)*  Right rotation (30) WNL  Left rotation (30) WNL      Hip  Right Left  Flexion (125) 100* 120  Extension (15)    Abduction (40) 40   Adduction     Internal Rotation (45) 20*   External Rotation (45) WNL*       (* = pain; Blank rows = not tested)  LE MMT: MMT (out of 5) Right 11/01/23 Left 11/01/23  Hip flexion 5 4  Hip extension    Hip abduction 4-* 4-  Hip adduction 5 5  Hip internal rotation    Hip external rotation    Knee flexion 5 5  Knee extension 5 5  Ankle dorsiflexion    Ankle plantarflexion    Ankle inversion    Ankle eversion    (* = pain; Blank rows = not tested)  Muscle Length Hamstrings: R: Positive L: Positive E[place] (quadriceps): R: Not examined L: Not examined   Palpation Location Right Left         Lumbar paraspinals    Quadratus Lumborum 0   Gluteus Maximus 1   Gluteus Medius 2   Deep hip external rotators 2   PSIS    Fortin's Area (SIJ) 2   Greater Trochanter    (Blank rows = not tested) Graded on 0-4 scale (0 = no pain, 1 = pain, 2 = pain with wincing/grimacing/flinching, 3 = pain with withdrawal, 4 = unwilling to allow palpation)  Special Tests Lumbar Radiculopathy and Discogenic: Centralization and Peripheralization (SN 92, -LR 0.12): Not examined Slump (SN 83, -LR 0.32): R: Negative L: Positive SLR (SN 92, -LR 0.29): R: Negative L:  Negative Crossed SLR (SP 90): R: Negative L: Negative General lumbar traction: Moderate relief of gluteal pain  Facet Joint: Extension-Rotation (SN 100, -LR 0.0): R: Negative L: Negative  Lumbar Foraminal Stenosis: Lumbar quadrant (SN 70): R: Negative L: Negative  Hip: FABER (SN 81): R: Negative L: Positive FADIR (SN 94): R: Not examined L: Not examined Hip scour (SN 50): R: Negative L: Positive  SIJ:  Thigh Thrust (SN 88, -LR 0.18) : R: Negative L: Positive Anterior Primary Stress: Positive Posterior Primary Stress: Positive     TODAY'S TREATMENT: DATE: 11/08/2023    SUBJECTIVE STATEMENT:   Pt reports doing relatively well with getting up quickly  from her seat on Thursday. Pt reports spasm in her back on Friday afternoon. Patient reports 7/10 pain at arrival to PT. Patient reports compliance with HEP.   Thoracolumbar AROM: Flexion 75%*, Extension 75%, Lateral flexion R/L 75%*, Rotation WFL    Repeated movement screen Repeated extension in standing (2x10): no effect during, no worse afterward Repeated flexion in lying (2x10): increase during, worse afterward   Manual Therapy - for symptom modulation, soft tissue sensitivity and mobility, joint mobility,  ROM   Manual lumbar traction, 10 sec intermittent holds; x 5 min  STM and IASTM with Hypervolt along R>L L4-S1 longissimus lumborum, emphasis on gluteal musculature; x 10 minutes   Trigger Point Dry Needling  Subsequent Treatment: Instructions provided previously at initial dry needling treatment.   Patient Verbal Consent Given: Yes Education Handout Provided: Previously Provided Muscles Treated: R iliocostalis lumborum at L4-5, R gluteus maximus x 1 and R piriformis  x 1with 0.30 x 70 needles Electrical Stimulation Performed: No Treatment Response/Outcome: Mild post-treatment soreness     Therapeutic Exercise - for improved soft tissue flexibility and extensibility as needed for ROM, improved strength as needed to improve performance of CKC activities/functional movements  Piriformis stretch; 2 x 30 sec, focus on RLE today   PATIENT EDUCATION: discussed expectations following dry needling/treatment today, continued HEP and activity modification. We discussed trial of repeated extension as primary repeated movement at home.     *not today* Lower trunk rotations; 1 x 10 alternating R/L Supine active hamstring stretch (nerve floss technique); reviewed   PATIENT EDUCATION:  Education details: see above for patient education details Person educated: Patient Education method: Explanation, Demonstration, and Handouts Education comprehension: verbalized understanding and  returned demonstration   HOME EXERCISE PROGRAM:  Access Code: [email or web address] URL: [email or web address] Date: 11/08/2023 Prepared by: Consuela Mimes  Exercises - Standing Lumbar Extension with Counter  - 3 x daily - 7 x weekly - 2 sets - 10 reps - 1 hold - Hooklying Lumbar Traction  - 2 x daily - 7 x weekly - 10 reps - 5-10sec hold - Supine Piriformis Stretch with Foot on Ground  - 2 x daily - 7 x weekly - 3 sets - 30sec hold - Supine Hamstring Stretch  - 2 x daily - 7 x weekly - 2 sets - 10 reps - 1sec hold   ASSESSMENT:  CLINICAL IMPRESSION: Patient has ongoing pain that is worse with flexion and flexion-based postures. We completed repeated movement screen today, with no significant worsening of symptoms with repeated extension and apparent increase in back/gluteal pain with repeated flexion in lying. Modest improvement in frontal plane ROM is noted following repeated extension, but response is limited and will be further checked at next visit. Pt has responded well in short term with dry needling. We utilized this again today and encouraged continued HEP. Patient has remaining deficits in thoracolumbar flexion/extension and L lateral flexion ROM, gait changes, gluteal strength, and taut/tender gluteal and deep hip ER mm. Pt will continue to benefit from skilled PT services to address deficits and improve function.   OBJECTIVE IMPAIRMENTS: Abnormal gait, difficulty walking, decreased ROM, decreased strength, impaired flexibility, and pain.   ACTIVITY LIMITATIONS: carrying, lifting, bending, squatting, sleeping, stairs, transfers, and bed mobility  PARTICIPATION LIMITATIONS: meal prep, cleaning, laundry, shopping, community activity, and occupation  PERSONAL FACTORS: Past/current experiences, Time since onset of injury/illness/exacerbation, and 1-2+ comorbidities: (HLD, migraine disorder) are also affecting patient's functional outcome.   REHAB POTENTIAL: Good  CLINICAL  DECISION MAKING: Unstable/unpredictable  EVALUATION COMPLEXITY: High   GOALS: Goals reviewed with patient? Yes  SHORT TERM GOALS: Target date: 11/24/2023  Pt will be independent with HEP in order to improve strength and decrease back pain to improve pain-free function at home and work. Baseline: 11/01/23: Baseline HEP initiated/MedBridge handout given.  Goal status: INITIAL   LONG TERM GOALS: Target date: 12/15/2023  Patient will have full thoracolumbar AROM without reproduction of pain as needed for reaching items on ground, household chores, bending.  Baseline: 11/01/22: Pain and motion loss with flexion and extension, pain with L lateral flexion  Goal status: INITIAL  2.  Pt will decrease worst back pain by at least 2 points on the NPRS in order to demonstrate clinically significant reduction in back pain. Baseline: 11/01/22: 10/10 pain at worst Goal status: INITIAL  3.  Pt will decrease mODI score by at least 13 points in order demonstrate clinically significant reduction in back pain/disability.       Baseline: 11/01/22: 20/50 Goal status: INITIAL  4.  Patient will demonstrate box lift with 30 lbs from floor to waist without increase in pain > 1-2/10 and proper body mechanics indicative of ability to maintain physical demands of her work.  Baseline: 11/01/22: Notable pain with lifting, reaching to floor.  Goal status: INITIAL   PLAN: PT FREQUENCY: 1-2x/week  PT DURATION: 6 weeks  PLANNED INTERVENTIONS: Therapeutic exercises, Therapeutic activity, Neuromuscular re-education, Balance training, Gait training, Patient/Family education, Self Care, Joint mobilization, Joint manipulation, Vestibular training, Canalith repositioning, Orthotic/Fit training, DME instructions, Dry Needling, Electrical stimulation, Spinal manipulation, Spinal mobilization, Cryotherapy, Moist heat, Taping, Traction, Ultrasound, Ionotophoresis 4mg /ml Dexamethasone, Manual therapy, and Re-evaluation.  PLAN FOR  NEXT SESSION: Traction/distraction techniques for symptom modulation, STM for gluteal mm; dry needling at future visits prn. Initiate isometrics and gluteal strengthening/trunk stabilization as tolerated. F/u on response with repeated extension.    Consuela Mimes, PT, DPT #[id number] Gertie Exon, PT 11/08/2023, 7:32 AM

## 2023-11-10 ENCOUNTER — Ambulatory Visit: Payer: 59 | Admitting: Physical Therapy

## 2023-11-10 DIAGNOSIS — R262 Difficulty in walking, not elsewhere classified: Secondary | ICD-10-CM

## 2023-11-10 DIAGNOSIS — M5459 Other low back pain: Secondary | ICD-10-CM

## 2023-11-10 DIAGNOSIS — M6281 Muscle weakness (generalized): Secondary | ICD-10-CM

## 2023-11-10 DIAGNOSIS — M25552 Pain in left hip: Secondary | ICD-10-CM

## 2023-11-10 DIAGNOSIS — M25551 Pain in right hip: Secondary | ICD-10-CM | POA: Diagnosis not present

## 2023-11-10 NOTE — Therapy (Unsigned)
 OUTPATIENT PHYSICAL THERAPY THORACOLUMBAR TREATMENT   Patient Name: Deborah Brandt MRN: [id number] DOB:Apr 02, 1966, 58 y.o., female Today's Date: 11/10/2023   END OF SESSION:  PT End of Session - 11/10/23 0735     Visit Number 4    Number of Visits 13    Date for PT Re-Evaluation 12/15/23    PT Start Time 0735    PT Stop Time 0817    PT Time Calculation (min) 42 min    Activity Tolerance Patient tolerated treatment well    Behavior During Therapy [facility] for tasks assessed/performed              Past Medical History:  Diagnosis Date   Complication of anesthesia    after laparoscopy(1988) BP dropped.  Low BP after epidurals for childbirth   Diverticulitis    Gestational diabetes 1990, 2001   Heart murmur    at birth. no recent mention   Hepatic steatosis    History of kidney stones    HLD (hyperlipidemia)    Migraine    Motion sickness    boats, back seat of car   Nephrolithiasis    Pneumonia    Sensorineural hearing loss (SNHL) of left ear    Past Surgical History:  Procedure Laterality Date   BREAST BIOPSY Left 08/22/2017   neg, PREDOMINANTLY FATTY BREAST TISSUE   COLON RESECTION SIGMOID     COLONOSCOPY  2004   COLONOSCOPY WITH PROPOFOL N/A 12/02/2017   Procedure: COLONOSCOPY WITH PROPOFOL;  Surgeon: Midge Minium, MD;  Location: [facility];  Service: Endoscopy;  Laterality: N/A;   DIAGNOSTIC LAPAROSCOPY  1988   checking for endometriosis but negative [facility] [place], [place]   LUMBAR EPIDURAL INJECTION  1990 x2   [facility] [place], [place]   POLYPECTOMY  12/02/2017   Procedure: POLYPECTOMY;  Surgeon: Midge Minium, MD;  Location: [facility];  Service: Endoscopy;;   SINUS EXPLORATION  1998   [place], [place]    TUBAL LIGATION     VAGINAL DELIVERY  1990   [facility] [place], [place]   Patient Active Problem List   Diagnosis Date Noted   Shortness of breath 09/21/2023   Lumbar radiculopathy 07/20/2023    Hip pain 08/04/2022   Shingles 04/21/2021   S/P laparoscopic-assisted sigmoidectomy 12/23/2020   Heart murmur 11/24/2020   Hyperglycemia 04/14/2020   Hearing loss 08/08/2019   Thumb pain 07/26/2018   Rash 07/26/2018   Cough 07/26/2018   Nasal obstruction 04/02/2018   Toe pain 04/02/2018   Obesity, morbid, BMI 40.0-49.9 (HCC) 04/02/2018   HLD (hyperlipidemia) 04/02/2018   Special screening for malignant neoplasms, colon    Rectal polyp    Drug-induced urticaria 09/20/2017   Routine general medical examination at a health care facility 02/08/2017   Abdominal pain, LLQ 01/21/2017   History of migraine 11/24/2016   Renal stones 11/24/2016   Diverticulitis 11/24/2016   Menopause 11/24/2016   Advance care planning 11/24/2016    PCP: Joaquim Nam, MD  REFERRING PROVIDER: Joan Flores, PA-C  REFERRING DIAG:  [id number] (ICD-10-CM) - Buttock pain  M54.50,G89.29 (ICD-10-CM) - Chronic midline low back pain without sciatica    RATIONALE FOR EVALUATION AND TREATMENT: Rehabilitation  THERAPY DIAG: Bilateral hip pain  Other low back pain  Difficulty in walking, not elsewhere classified  Muscle weakness (generalized)  ONSET DATE: 30 years + of episodic low back pain   FOLLOW-UP APPT SCHEDULED WITH REFERRING PROVIDER: No ; no formal f/u scheduled  PERTINENT HISTORY: Pt  is a 58 year old female with Hx of chronic episodic pain in bilateral buttock and low back. Patient reports pain in buttocks. Pt reports fear of falling secondary to pain. Pt is having cortisone shot in April. She wants to work in garden, but she is unable to at this time due to pain. Patient reports no numbness/tingling/paresthesias. Patient reports difficulty sleeping - this is unusual for her. Pt reports notable sensation of tightness in glutes. Pt is getting married 12/11/23.   PAIN:    Pain Intensity: Present: 8/10, Best: 3/10, Worst: 10/10 Pain location: Gluteal region L>R with pain down to ischial  region Pain Quality: burning, dull, and aching  Radiating: No  Numbness/Tingling: No Focal Weakness: No Aggravating factors: picking up items from ground, lifting boxes up/down Relieving factors: heating pad (not sure if it helps) 24-hour pain behavior: worse in evening/PM, notable pain in AM and difficulty with bending to turn off her fan  History of prior back injury, pain, surgery, or therapy: Yes; longstanding back pain, Hx of epidurals with both pregnancies  Imaging: Yes ;  Narrative & Impression  CLINICAL DATA:  Lumbar radiculopathy. Symptoms persist with greater than 6 weeks of treatment. Low back pain over the last 30 years which is worsening over time   EXAM: MRI LUMBAR SPINE WITHOUT CONTRAST   TECHNIQUE: Multiplanar, multisequence MR imaging of the lumbar spine was performed. No intravenous contrast was administered.   COMPARISON:  Radiography 08/02/2022   FINDINGS: Segmentation:  5 lumbar type vertebral bodies.   Alignment:  Normal   Vertebrae:  No fracture or focal bone lesion.   Conus medullaris and cauda equina: Conus extends to the L1 level. Conus and cauda equina appear normal.   Paraspinal and other soft tissues: Negative   Disc levels:   T10-11: Bilateral facet degeneration. No compressive stenosis. No disc pathology.   T11-12 through L1-2: Normal   L2-3: Degeneration of the disc with desiccation and mild bulging. Slight indentation of the thecal sac but no neural compression. No edematous endplate marrow changes or facet arthritis.   L3-4: Minimal desiccation and bulging of the disc.  No stenosis.   L4-5: Minimal desiccation and bulging of the disc. Minimal facet hypertrophy. No stenosis. The facet arthritis could possibly contribute to low back pain.   L5-S1: No disc abnormality. Bilateral facet osteoarthritis without slippage or encroachment. This could contribute to low back pain.   IMPRESSION: 1. L2-3: Degeneration of the disc with  desiccation and mild bulging. Slight indentation of the thecal sac but no neural compression. 2. L4-5: Minimal desiccation and bulging of the disc. Minimal facet hypertrophy. No stenosis. The facet arthritis could possibly contribute to low back pain. 3. L5-S1: Bilateral facet osteoarthritis without slippage or encroachment. This could contribute to low back pain.    Red flags: Negative for bowel/bladder changes, saddle paresthesia, personal history of cancer, h/o spinal tumors, h/o compression fx, h/o abdominal aneurysm, abdominal pain, chills/fever, night sweats, nausea, vomiting, unrelenting pain, first onset of insidious LBP <20 y/o  -Night pain    PRECAUTIONS: None  WEIGHT BEARING RESTRICTIONS: No  FALLS: Has patient fallen in last 6 months? No  Living Environment Lives with:  lives with fiance Lives in: House/apartment Stairs: Yes: Internal: 2 steps; none 2 steps down to den. 4 steps in. Notable difficulty with stair negotiation.  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 Mostly standing, moving items. Lifts  up to 30 lbs.   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   Patient Goals: Able to move better without hurting significantly    OBJECTIVE (data from initial evaluation unless otherwise dated):    Patient Surveys  Modified Oswestry 20/50  = 40%  GAIT: Distance walked: 40 ft  Assistive device utilized: None Level of assistance: SBA Comments: Wide BOS, ipsilateral thoracolumbar sidebend during stance phase of gait, mild pelvic drop bilat   AROM AROM (Normal range in degrees) AROM  11/01/23  Lumbar   Flexion (65) 50%*  Extension (30) 50% (not hurting, "stiff")  Right lateral flexion (25) WNL  Left lateral flexion (25) WNL (slower to access ROM)*  Right rotation (30) WNL  Left rotation (30) WNL      Hip  Right Left  Flexion (125) 100* 120  Extension (15)    Abduction (40) 40   Adduction     Internal Rotation (45) 20*   External Rotation (45) WNL*       (* = pain; Blank rows = not tested)  LE MMT: MMT (out of 5) Right 11/01/23 Left 11/01/23  Hip flexion 5 4  Hip extension    Hip abduction 4-* 4-  Hip adduction 5 5  Hip internal rotation    Hip external rotation    Knee flexion 5 5  Knee extension 5 5  Ankle dorsiflexion    Ankle plantarflexion    Ankle inversion    Ankle eversion    (* = pain; Blank rows = not tested)  Muscle Length Hamstrings: R: Positive L: Positive E[place] (quadriceps): R: Not examined L: Not examined   Palpation Location Right Left         Lumbar paraspinals    Quadratus Lumborum 0   Gluteus Maximus 1   Gluteus Medius 2   Deep hip external rotators 2   PSIS    Fortin's Area (SIJ) 2   Greater Trochanter    (Blank rows = not tested) Graded on 0-4 scale (0 = no pain, 1 = pain, 2 = pain with wincing/grimacing/flinching, 3 = pain with withdrawal, 4 = unwilling to allow palpation)  Special Tests Lumbar Radiculopathy and Discogenic: Centralization and Peripheralization (SN 92, -LR 0.12): Not examined Slump (SN 83, -LR 0.32): R: Negative L: Positive SLR (SN 92, -LR 0.29): R: Negative L:  Negative Crossed SLR (SP 90): R: Negative L: Negative General lumbar traction: Moderate relief of gluteal pain  Facet Joint: Extension-Rotation (SN 100, -LR 0.0): R: Negative L: Negative  Lumbar Foraminal Stenosis: Lumbar quadrant (SN 70): R: Negative L: Negative  Hip: FABER (SN 81): R: Negative L: Positive FADIR (SN 94): R: Not examined L: Not examined Hip scour (SN 50): R: Negative L: Positive  SIJ:  Thigh Thrust (SN 88, -LR 0.18) : R: Negative L: Positive Anterior Primary Stress: Positive Posterior Primary Stress: Positive     TODAY'S TREATMENT: DATE: 11/10/2023    SUBJECTIVE STATEMENT:   Pt reports doing well with positional traction at home,  lying with legs in 90/90 position. Patient reports compliance with HEP. Pt reports compliance with HEP, though she is not fond of repeated extension exercise. Patient reports L>R sided pain at this time affecting low back and gluteal region.    Thoracolumbar AROM: Flexion 75%*, Extension 75%, Lateral flexion R/L 100%*, Rotation [facility]      Therapeutic Exercise - for improved soft tissue flexibility and extensibility as needed for ROM, improved strength as needed to improve performance of  [facility] activities/functional movements  Repeated extension in standing, pt leaning on table; 2x10 Piriformis stretch; 2 x 30 sec, focus on RLE today   PATIENT EDUCATION: discussed expectations with use of repeated extension program, continued HEP and increased hydration following DN.   *not today* Lower trunk rotations; 1 x 10 alternating R/L Supine active hamstring stretch (nerve floss technique); reviewed   Manual Therapy - for symptom modulation, soft tissue sensitivity and mobility, joint mobility, ROM   Manual lumbar traction, 10 sec intermittent holds; x 5 min  STM and IASTM with Hypervolt along R>L L4-S1 longissimus lumborum, emphasis on gluteal musculature; x 10 minutes   Trigger Point Dry Needling  Subsequent Treatment: Instructions provided previously at initial dry needling treatment.   Patient Verbal Consent Given: Yes Education Handout Provided: Previously Provided Muscles Treated: R and L iliocostalis lumborum at L4-5, Bilateral multifidus at L5 level, R gluteus maximus x 1, with 0.30 x 70 needles Electrical Stimulation Performed: No Treatment Response/Outcome: Mild post-treatment soreness     PATIENT EDUCATION:  Education details: see above for patient education details Person educated: Patient Education method: Explanation, Demonstration, and Handouts Education comprehension: verbalized understanding and returned demonstration   HOME EXERCISE PROGRAM:  Access Code:  [email or web address] URL: [email or web address] Date: 11/08/2023 Prepared by: Consuela Mimes  Exercises - Standing Lumbar Extension with Counter  - 3 x daily - 7 x weekly - 2 sets - 10 reps - 1 hold - Hooklying Lumbar Traction  - 2 x daily - 7 x weekly - 10 reps - 5-10sec hold - Supine Piriformis Stretch with Foot on Ground  - 2 x daily - 7 x weekly - 3 sets - 30sec hold - Supine Hamstring Stretch  - 2 x daily - 7 x weekly - 2 sets - 10 reps - 1sec hold   ASSESSMENT:  CLINICAL IMPRESSION: Patient does respond well in short-term with traction, positional traction, and use of dry needling. We utilized multifidus DN to treat via radiculopathic model to reduce referred symptoms from L-spine. Pt reports more pain affecting lower lumbar region this AM. We discussed potentially feeling more symptoms closer to spine with repeated movement program and less pain affecting iliolumbar/gluteal region potentially. Patient has remaining deficits in thoracolumbar flexion/extension and L lateral flexion ROM, gait changes, gluteal strength, and taut/tender gluteal and deep hip ER mm. Pt will continue to benefit from skilled PT services to address deficits and improve function.   OBJECTIVE IMPAIRMENTS: Abnormal gait, difficulty walking, decreased ROM, decreased strength, impaired flexibility, and pain.   ACTIVITY LIMITATIONS: carrying, lifting, bending, squatting, sleeping, stairs, transfers, and bed mobility  PARTICIPATION LIMITATIONS: meal prep, cleaning, laundry, shopping, community activity, and occupation  PERSONAL FACTORS: Past/current experiences, Time since onset of injury/illness/exacerbation, and 1-2+ comorbidities: (HLD, migraine disorder) are also affecting patient's functional outcome.   REHAB POTENTIAL: Good  CLINICAL DECISION MAKING: Unstable/unpredictable  EVALUATION COMPLEXITY: High   GOALS: Goals reviewed with patient? Yes  SHORT TERM GOALS: Target date: 11/24/2023  Pt will be  independent with HEP in order to improve strength and decrease back pain to improve pain-free function at home and work. Baseline: 11/01/23: Baseline HEP initiated/MedBridge handout given.  Goal status: INITIAL   LONG TERM GOALS: Target date: 12/15/2023  Patient will have full thoracolumbar AROM without reproduction of pain as needed for reaching items on ground, household chores, bending. Baseline: 11/01/22: Pain and motion loss with flexion and extension, pain with L lateral flexion  Goal status: INITIAL  2.  Pt will decrease worst back pain  by at least 2 points on the NPRS in order to demonstrate clinically significant reduction in back pain. Baseline: 11/01/22: 10/10 pain at worst Goal status: INITIAL  3.  Pt will decrease mODI score by at least 13 points in order demonstrate clinically significant reduction in back pain/disability.       Baseline: 11/01/22: 20/50 Goal status: INITIAL  4.  Patient will demonstrate box lift with 30 lbs from floor to waist without increase in pain > 1-2/10 and proper body mechanics indicative of ability to maintain physical demands of her work.  Baseline: 11/01/22: Notable pain with lifting, reaching to floor.  Goal status: INITIAL   PLAN: PT FREQUENCY: 1-2x/week  PT DURATION: 6 weeks  PLANNED INTERVENTIONS: Therapeutic exercises, Therapeutic activity, Neuromuscular re-education, Balance training, Gait training, Patient/Family education, Self Care, Joint mobilization, Joint manipulation, Vestibular training, Canalith repositioning, Orthotic/Fit training, DME instructions, Dry Needling, Electrical stimulation, Spinal manipulation, Spinal mobilization, Cryotherapy, Moist heat, Taping, Traction, Ultrasound, Ionotophoresis 4mg /ml Dexamethasone, Manual therapy, and Re-evaluation.  PLAN FOR NEXT SESSION: Traction/distraction techniques for symptom modulation, STM for gluteal mm; dry needling at future visits prn. Initiate isometrics and gluteal  strengthening/trunk stabilization as tolerated. F/u on response with repeated extension.    Consuela Mimes, PT, DPT #[id number] Gertie Exon, PT 11/11/2023, 11:12 AM

## 2023-11-11 ENCOUNTER — Encounter: Payer: Self-pay | Admitting: Physical Therapy

## 2023-11-15 ENCOUNTER — Ambulatory Visit: Payer: 59 | Admitting: Physical Therapy

## 2023-11-15 ENCOUNTER — Encounter: Payer: Self-pay | Admitting: Physical Therapy

## 2023-11-15 DIAGNOSIS — M5459 Other low back pain: Secondary | ICD-10-CM

## 2023-11-15 DIAGNOSIS — M25551 Pain in right hip: Secondary | ICD-10-CM | POA: Diagnosis not present

## 2023-11-15 DIAGNOSIS — M6281 Muscle weakness (generalized): Secondary | ICD-10-CM

## 2023-11-15 DIAGNOSIS — R262 Difficulty in walking, not elsewhere classified: Secondary | ICD-10-CM

## 2023-11-15 NOTE — Therapy (Signed)
 OUTPATIENT PHYSICAL THERAPY THORACOLUMBAR TREATMENT   Patient Name: Deborah Brandt MRN: [id number] DOB:04-26-1966, 58 y.o., female Today's Date: 11/15/2023   END OF SESSION:  PT End of Session - 11/15/23 0731     Visit Number 5    Number of Visits 13    Date for PT Re-Evaluation 12/15/23    PT Start Time 0731    PT Stop Time 0815    PT Time Calculation (min) 44 min    Activity Tolerance Patient tolerated treatment well    Behavior During Therapy [facility] for tasks assessed/performed              Past Medical History:  Diagnosis Date   Complication of anesthesia    after laparoscopy(1988) BP dropped.  Low BP after epidurals for childbirth   Diverticulitis    Gestational diabetes 1990, 2001   Heart murmur    at birth. no recent mention   Hepatic steatosis    History of kidney stones    HLD (hyperlipidemia)    Migraine    Motion sickness    boats, back seat of car   Nephrolithiasis    Pneumonia    Sensorineural hearing loss (SNHL) of left ear    Past Surgical History:  Procedure Laterality Date   BREAST BIOPSY Left 08/22/2017   neg, PREDOMINANTLY FATTY BREAST TISSUE   COLON RESECTION SIGMOID     COLONOSCOPY  2004   COLONOSCOPY WITH PROPOFOL N/A 12/02/2017   Procedure: COLONOSCOPY WITH PROPOFOL;  Surgeon: Midge Minium, MD;  Location: [facility];  Service: Endoscopy;  Laterality: N/A;   DIAGNOSTIC LAPAROSCOPY  1988   checking for endometriosis but negative [facility] [place], [place]   LUMBAR EPIDURAL INJECTION  1990 x2   [facility] [place], [place]   POLYPECTOMY  12/02/2017   Procedure: POLYPECTOMY;  Surgeon: Midge Minium, MD;  Location: [facility];  Service: Endoscopy;;   SINUS EXPLORATION  1998   [place], [place]    TUBAL LIGATION     VAGINAL DELIVERY  1990   [facility] [place], [place]   Patient Active Problem List   Diagnosis Date Noted   Shortness of breath 09/21/2023   Lumbar radiculopathy 07/20/2023    Hip pain 08/04/2022   Shingles 04/21/2021   S/P laparoscopic-assisted sigmoidectomy 12/23/2020   Heart murmur 11/24/2020   Hyperglycemia 04/14/2020   Hearing loss 08/08/2019   Thumb pain 07/26/2018   Rash 07/26/2018   Cough 07/26/2018   Nasal obstruction 04/02/2018   Toe pain 04/02/2018   Obesity, morbid, BMI 40.0-49.9 (HCC) 04/02/2018   HLD (hyperlipidemia) 04/02/2018   Special screening for malignant neoplasms, colon    Rectal polyp    Drug-induced urticaria 09/20/2017   Routine general medical examination at a health care facility 02/08/2017   Abdominal pain, LLQ 01/21/2017   History of migraine 11/24/2016   Renal stones 11/24/2016   Diverticulitis 11/24/2016   Menopause 11/24/2016   Advance care planning 11/24/2016    PCP: Joaquim Nam, MD  REFERRING PROVIDER: Joan Flores, PA-C  REFERRING DIAG:  [phone] (ICD-10-CM) - Buttock pain  M54.50,G89.29 (ICD-10-CM) - Chronic midline low back pain without sciatica    RATIONALE FOR EVALUATION AND TREATMENT: Rehabilitation  THERAPY DIAG: Bilateral hip pain  Other low back pain  Difficulty in walking, not elsewhere classified  Muscle weakness (generalized)  ONSET DATE: 30 years + of episodic low back pain   FOLLOW-UP APPT SCHEDULED WITH REFERRING PROVIDER: No ; no formal f/u scheduled  PERTINENT HISTORY: Pt  is a 58 year old female with Hx of chronic episodic pain in bilateral buttock and low back. Patient reports pain in buttocks. Pt reports fear of falling secondary to pain. Pt is having cortisone shot in April. She wants to work in garden, but she is unable to at this time due to pain. Patient reports no numbness/tingling/paresthesias. Patient reports difficulty sleeping - this is unusual for her. Pt reports notable sensation of tightness in glutes. Pt is getting married 12/11/23.   PAIN:    Pain Intensity: Present: 8/10, Best: 3/10, Worst: 10/10 Pain location: Gluteal region L>R with pain down to ischial  region Pain Quality: burning, dull, and aching  Radiating: No  Numbness/Tingling: No Focal Weakness: No Aggravating factors: picking up items from ground, lifting boxes up/down Relieving factors: heating pad (not sure if it helps) 24-hour pain behavior: worse in evening/PM, notable pain in AM and difficulty with bending to turn off her fan  History of prior back injury, pain, surgery, or therapy: Yes; longstanding back pain, Hx of epidurals with both pregnancies  Imaging: Yes ;  Narrative & Impression  CLINICAL DATA:  Lumbar radiculopathy. Symptoms persist with greater than 6 weeks of treatment. Low back pain over the last 30 years which is worsening over time   EXAM: MRI LUMBAR SPINE WITHOUT CONTRAST   TECHNIQUE: Multiplanar, multisequence MR imaging of the lumbar spine was performed. No intravenous contrast was administered.   COMPARISON:  Radiography 08/02/2022   FINDINGS: Segmentation:  5 lumbar type vertebral bodies.   Alignment:  Normal   Vertebrae:  No fracture or focal bone lesion.   Conus medullaris and cauda equina: Conus extends to the L1 level. Conus and cauda equina appear normal.   Paraspinal and other soft tissues: Negative   Disc levels:   T10-11: Bilateral facet degeneration. No compressive stenosis. No disc pathology.   T11-12 through L1-2: Normal   L2-3: Degeneration of the disc with desiccation and mild bulging. Slight indentation of the thecal sac but no neural compression. No edematous endplate marrow changes or facet arthritis.   L3-4: Minimal desiccation and bulging of the disc.  No stenosis.   L4-5: Minimal desiccation and bulging of the disc. Minimal facet hypertrophy. No stenosis. The facet arthritis could possibly contribute to low back pain.   L5-S1: No disc abnormality. Bilateral facet osteoarthritis without slippage or encroachment. This could contribute to low back pain.   IMPRESSION: 1. L2-3: Degeneration of the disc with  desiccation and mild bulging. Slight indentation of the thecal sac but no neural compression. 2. L4-5: Minimal desiccation and bulging of the disc. Minimal facet hypertrophy. No stenosis. The facet arthritis could possibly contribute to low back pain. 3. L5-S1: Bilateral facet osteoarthritis without slippage or encroachment. This could contribute to low back pain.    Red flags: Negative for bowel/bladder changes, saddle paresthesia, personal history of cancer, h/o spinal tumors, h/o compression fx, h/o abdominal aneurysm, abdominal pain, chills/fever, night sweats, nausea, vomiting, unrelenting pain, first onset of insidious LBP <20 y/o  -Night pain    PRECAUTIONS: None  WEIGHT BEARING RESTRICTIONS: No  FALLS: Has patient fallen in last 6 months? No  Living Environment Lives with:  lives with fiance Lives in: House/apartment Stairs: Yes: Internal: 2 steps; none 2 steps down to den. 4 steps in. Notable difficulty with stair negotiation.  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 Mostly standing, moving items. Lifts  up to 30 lbs.   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   Patient Goals: Able to move better without hurting significantly    OBJECTIVE (data from initial evaluation unless otherwise dated):    Patient Surveys  Modified Oswestry 20/50  = 40%  GAIT: Distance walked: 40 ft  Assistive device utilized: None Level of assistance: SBA Comments: Wide BOS, ipsilateral thoracolumbar sidebend during stance phase of gait, mild pelvic drop bilat   AROM AROM (Normal range in degrees) AROM  11/01/23  Lumbar   Flexion (65) 50%*  Extension (30) 50% (not hurting, "stiff")  Right lateral flexion (25) WNL  Left lateral flexion (25) WNL (slower to access ROM)*  Right rotation (30) WNL  Left rotation (30) WNL      Hip  Right Left  Flexion (125) 100* 120  Extension (15)    Abduction (40) 40   Adduction     Internal Rotation (45) 20*   External Rotation (45) WNL*       (* = pain; Blank rows = not tested)  LE MMT: MMT (out of 5) Right 11/01/23 Left 11/01/23  Hip flexion 5 4  Hip extension    Hip abduction 4-* 4-  Hip adduction 5 5  Hip internal rotation    Hip external rotation    Knee flexion 5 5  Knee extension 5 5  Ankle dorsiflexion    Ankle plantarflexion    Ankle inversion    Ankle eversion    (* = pain; Blank rows = not tested)  Muscle Length Hamstrings: R: Positive L: Positive E[place] (quadriceps): R: Not examined L: Not examined   Palpation Location Right Left         Lumbar paraspinals    Quadratus Lumborum 0   Gluteus Maximus 1   Gluteus Medius 2   Deep hip external rotators 2   PSIS    Fortin's Area (SIJ) 2   Greater Trochanter    (Blank rows = not tested) Graded on 0-4 scale (0 = no pain, 1 = pain, 2 = pain with wincing/grimacing/flinching, 3 = pain with withdrawal, 4 = unwilling to allow palpation)  Special Tests Lumbar Radiculopathy and Discogenic: Centralization and Peripheralization (SN 92, -LR 0.12): Not examined Slump (SN 83, -LR 0.32): R: Negative L: Positive SLR (SN 92, -LR 0.29): R: Negative L:  Negative Crossed SLR (SP 90): R: Negative L: Negative General lumbar traction: Moderate relief of gluteal pain  Facet Joint: Extension-Rotation (SN 100, -LR 0.0): R: Negative L: Negative  Lumbar Foraminal Stenosis: Lumbar quadrant (SN 70): R: Negative L: Negative  Hip: FABER (SN 81): R: Negative L: Positive FADIR (SN 94): R: Not examined L: Not examined Hip scour (SN 50): R: Negative L: Positive  SIJ:  Thigh Thrust (SN 88, -LR 0.18) : R: Negative L: Positive Anterior Primary Stress: Positive Posterior Primary Stress: Positive     TODAY'S TREATMENT: DATE: 11/15/2023   SUBJECTIVE STATEMENT:   Pt reports typical soreness from last visit. She has pain  primarily affecting L gluteal region at arrival to PT. Patient reports R side is "okay." More pain along L gluteal region and L low back.    Thoracolumbar AROM: Flexion 75% (pull in hamstrings), Extension 75%*, Lateral flexion R/L 100% (pain to L in low back), Rotation [facility]     Therapeutic Exercise - for improved soft tissue flexibility and extensibility as needed for ROM, improved strength as needed to improve performance of CKC activities/functional movements  Repeated extension  in lying, pt leaning on table; 2x10  -remaining pain in low back, no pain in gluteal region (pain centralized in lower lumbar region)  Lower trunk rotation; 1x10 R/L Piriformis stretch; reviewed   PATIENT EDUCATION: discussed expectations with use of repeated extension program, centralization phenomenon, role of repeated movement program. HEP updated to include REIL (prone press-up) in place of repeated standing lumbar extension.   *not today* Lower trunk rotations; 1 x 10 alternating R/L Supine active hamstring stretch (nerve floss technique); reviewed   Manual Therapy - for symptom modulation, soft tissue sensitivity and mobility, joint mobility, ROM    STM and IASTM with Hypervolt along L>R L4-S1 longissimus lumborum, emphasis on L>R lumbar paraspinals ; x 12 minutes Prone CPA L4-5, gr II for pain control; 2 x 30 sec bouts   *not today* Manual lumbar traction, 10 sec intermittent holds; x 5 min    Trigger Point Dry Needling  Subsequent Treatment: Instructions provided previously at initial dry needling treatment.   Patient Verbal Consent Given: Yes Education Handout Provided: Previously Provided Muscles Treated: R and L iliocostalis lumborum at L4-5, Bilateral multifidus at L5 level; with 0.30 x 70 needles Electrical Stimulation Performed: No Treatment Response/Outcome: Mild post-treatment soreness     PATIENT EDUCATION:  Education details: see above for patient education details Person  educated: Patient Education method: Explanation, Demonstration, and Handouts Education comprehension: verbalized understanding and returned demonstration   HOME EXERCISE PROGRAM:  Access Code: [email or web address] URL: [email or web address] Date: 11/08/2023 Prepared by: Consuela Mimes  Exercises - Standing Lumbar Extension with Counter  - 3 x daily - 7 x weekly - 2 sets - 10 reps - 1 hold - Hooklying Lumbar Traction  - 2 x daily - 7 x weekly - 10 reps - 5-10sec hold - Supine Piriformis Stretch with Foot on Ground  - 2 x daily - 7 x weekly - 3 sets - 30sec hold - Supine Hamstring Stretch  - 2 x daily - 7 x weekly - 2 sets - 10 reps - 1sec hold   ASSESSMENT:  CLINICAL IMPRESSION: Patient has pain centralized along axial lower lumbar region with minimal gluteal symptoms following repeated extension in lying. Pt states today that she prefers this technique over standing extension, and this may be more effective with unloading spine to complete repeated movement. Pt exhibits normal frontal and transverse plane ROM - some pain with lateral flexion toward L noted today. Pt to continue with REIL/prone press-up in place of standing lumbar extension for her HEP. Patient has remaining deficits in thoracolumbar flexion/extension and L lateral flexion ROM, gait changes, gluteal strength, and taut/tender gluteal and deep hip ER mm. Pt will continue to benefit from skilled PT services to address deficits and improve function.   OBJECTIVE IMPAIRMENTS: Abnormal gait, difficulty walking, decreased ROM, decreased strength, impaired flexibility, and pain.   ACTIVITY LIMITATIONS: carrying, lifting, bending, squatting, sleeping, stairs, transfers, and bed mobility  PARTICIPATION LIMITATIONS: meal prep, cleaning, laundry, shopping, community activity, and occupation  PERSONAL FACTORS: Past/current experiences, Time since onset of injury/illness/exacerbation, and 1-2+ comorbidities: (HLD, migraine disorder)  are also affecting patient's functional outcome.   REHAB POTENTIAL: Good  CLINICAL DECISION MAKING: Unstable/unpredictable  EVALUATION COMPLEXITY: High   GOALS: Goals reviewed with patient? Yes  SHORT TERM GOALS: Target date: 11/24/2023  Pt will be independent with HEP in order to improve strength and decrease back pain to improve pain-free function at home and work. Baseline: 11/01/23: Baseline HEP initiated/MedBridge handout given.  Goal status: INITIAL   LONG  TERM GOALS: Target date: 12/15/2023  Patient will have full thoracolumbar AROM without reproduction of pain as needed for reaching items on ground, household chores, bending. Baseline: 11/01/22: Pain and motion loss with flexion and extension, pain with L lateral flexion  Goal status: INITIAL  2.  Pt will decrease worst back pain by at least 2 points on the NPRS in order to demonstrate clinically significant reduction in back pain. Baseline: 11/01/22: 10/10 pain at worst Goal status: INITIAL  3.  Pt will decrease mODI score by at least 13 points in order demonstrate clinically significant reduction in back pain/disability.       Baseline: 11/01/22: 20/50 Goal status: INITIAL  4.  Patient will demonstrate box lift with 30 lbs from floor to waist without increase in pain > 1-2/10 and proper body mechanics indicative of ability to maintain physical demands of her work.  Baseline: 11/01/22: Notable pain with lifting, reaching to floor.  Goal status: INITIAL   PLAN: PT FREQUENCY: 1-2x/week  PT DURATION: 6 weeks  PLANNED INTERVENTIONS: Therapeutic exercises, Therapeutic activity, Neuromuscular re-education, Balance training, Gait training, Patient/Family education, Self Care, Joint mobilization, Joint manipulation, Vestibular training, Canalith repositioning, Orthotic/Fit training, DME instructions, Dry Needling, Electrical stimulation, Spinal manipulation, Spinal mobilization, Cryotherapy, Moist heat, Taping, Traction,  Ultrasound, Ionotophoresis 4mg /ml Dexamethasone, Manual therapy, and Re-evaluation.  PLAN FOR NEXT SESSION: Traction/distraction techniques for symptom modulation, STM for gluteal mm; dry needling at future visits prn. Initiate isometrics and gluteal strengthening/trunk stabilization as tolerated. F/u on response with repeated extension.    Consuela Mimes, PT, DPT #[id number] Gertie Exon, PT 11/15/2023, 9:12 AM

## 2023-11-17 ENCOUNTER — Ambulatory Visit: Payer: 59 | Admitting: Physical Therapy

## 2023-11-21 NOTE — Therapy (Deleted)
 OUTPATIENT PHYSICAL THERAPY THORACOLUMBAR TREATMENT   Patient Name: Deborah Brandt MRN: [id number] DOB:Nov 18, 1965, 58 y.o., female Today's Date: 11/21/2023   END OF SESSION:     Past Medical History:  Diagnosis Date   Complication of anesthesia    after laparoscopy(1988) BP dropped.  Low BP after epidurals for childbirth   Diverticulitis    Gestational diabetes 1990, 2001   Heart murmur    at birth. no recent mention   Hepatic steatosis    History of kidney stones    HLD (hyperlipidemia)    Migraine    Motion sickness    boats, back seat of car   Nephrolithiasis    Pneumonia    Sensorineural hearing loss (SNHL) of left ear    Past Surgical History:  Procedure Laterality Date   BREAST BIOPSY Left 08/22/2017   neg, PREDOMINANTLY FATTY BREAST TISSUE   COLON RESECTION SIGMOID     COLONOSCOPY  2004   COLONOSCOPY WITH PROPOFOL N/A 12/02/2017   Procedure: COLONOSCOPY WITH PROPOFOL;  Surgeon: Midge Minium, MD;  Location: [facility];  Service: Endoscopy;  Laterality: N/A;   DIAGNOSTIC LAPAROSCOPY  1988   checking for endometriosis but negative [facility] [place], [place]   LUMBAR EPIDURAL INJECTION  1990 x2   [facility] [place], [place]   POLYPECTOMY  12/02/2017   Procedure: POLYPECTOMY;  Surgeon: Midge Minium, MD;  Location: [facility];  Service: Endoscopy;;   SINUS EXPLORATION  1998   [place], [place]    TUBAL LIGATION     VAGINAL DELIVERY  1990   [facility] [place], [place]   Patient Active Problem List   Diagnosis Date Noted   Shortness of breath 09/21/2023   Lumbar radiculopathy 07/20/2023   Hip pain 08/04/2022   Shingles 04/21/2021   S/P laparoscopic-assisted sigmoidectomy 12/23/2020   Heart murmur 11/24/2020   Hyperglycemia 04/14/2020   Hearing loss 08/08/2019   Thumb pain 07/26/2018   Rash 07/26/2018   Cough 07/26/2018   Nasal obstruction 04/02/2018   Toe pain 04/02/2018   Obesity, morbid, BMI 40.0-49.9  (HCC) 04/02/2018   HLD (hyperlipidemia) 04/02/2018   Special screening for malignant neoplasms, colon    Rectal polyp    Drug-induced urticaria 09/20/2017   Routine general medical examination at a health care facility 02/08/2017   Abdominal pain, LLQ 01/21/2017   History of migraine 11/24/2016   Renal stones 11/24/2016   Diverticulitis 11/24/2016   Menopause 11/24/2016   Advance care planning 11/24/2016    PCP: Joaquim Nam, MD  REFERRING PROVIDER: Joan Flores, PA-C  REFERRING DIAG:  [phone] (ICD-10-CM) - Buttock pain  M54.50,G89.29 (ICD-10-CM) - Chronic midline low back pain without sciatica    RATIONALE FOR EVALUATION AND TREATMENT: Rehabilitation  THERAPY DIAG: Bilateral hip pain  Other low back pain  Difficulty in walking, not elsewhere classified  Muscle weakness (generalized)  ONSET DATE: 30 years + of episodic low back pain   FOLLOW-UP APPT SCHEDULED WITH REFERRING PROVIDER: No ; no formal f/u scheduled  PERTINENT HISTORY: Pt is a 58 year old female with Hx of chronic episodic pain in bilateral buttock and low back. Patient reports pain in buttocks. Pt reports fear of falling secondary to pain. Pt is having cortisone shot in April. She wants to work in garden, but she is unable to at this time due to pain. Patient reports no numbness/tingling/paresthesias. Patient reports difficulty sleeping - this is unusual for her. Pt reports notable sensation of tightness in glutes. Pt is getting married 12/11/23.  PAIN:    Pain Intensity: Present: 8/10, Best: 3/10, Worst: 10/10 Pain location: Gluteal region L>R with pain down to ischial region Pain Quality: burning, dull, and aching  Radiating: No  Numbness/Tingling: No Focal Weakness: No Aggravating factors: picking up items from ground, lifting boxes up/down Relieving factors: heating pad (not sure if it helps) 24-hour pain behavior: worse in evening/PM, notable pain in AM and difficulty with bending to turn off  her fan  History of prior back injury, pain, surgery, or therapy: Yes; longstanding back pain, Hx of epidurals with both pregnancies  Imaging: Yes ;  Narrative & Impression  CLINICAL DATA:  Lumbar radiculopathy. Symptoms persist with greater than 6 weeks of treatment. Low back pain over the last 30 years which is worsening over time   EXAM: MRI LUMBAR SPINE WITHOUT CONTRAST   TECHNIQUE: Multiplanar, multisequence MR imaging of the lumbar spine was performed. No intravenous contrast was administered.   COMPARISON:  Radiography 08/02/2022   FINDINGS: Segmentation:  5 lumbar type vertebral bodies.   Alignment:  Normal   Vertebrae:  No fracture or focal bone lesion.   Conus medullaris and cauda equina: Conus extends to the L1 level. Conus and cauda equina appear normal.   Paraspinal and other soft tissues: Negative   Disc levels:   T10-11: Bilateral facet degeneration. No compressive stenosis. No disc pathology.   T11-12 through L1-2: Normal   L2-3: Degeneration of the disc with desiccation and mild bulging. Slight indentation of the thecal sac but no neural compression. No edematous endplate marrow changes or facet arthritis.   L3-4: Minimal desiccation and bulging of the disc.  No stenosis.   L4-5: Minimal desiccation and bulging of the disc. Minimal facet hypertrophy. No stenosis. The facet arthritis could possibly contribute to low back pain.   L5-S1: No disc abnormality. Bilateral facet osteoarthritis without slippage or encroachment. This could contribute to low back pain.   IMPRESSION: 1. L2-3: Degeneration of the disc with desiccation and mild bulging. Slight indentation of the thecal sac but no neural compression. 2. L4-5: Minimal desiccation and bulging of the disc. Minimal facet hypertrophy. No stenosis. The facet arthritis could possibly contribute to low back pain. 3. L5-S1: Bilateral facet osteoarthritis without slippage or encroachment. This could  contribute to low back pain.    Red flags: Negative for bowel/bladder changes, saddle paresthesia, personal history of cancer, h/o spinal tumors, h/o compression fx, h/o abdominal aneurysm, abdominal pain, chills/fever, night sweats, nausea, vomiting, unrelenting pain, first onset of insidious LBP <20 y/o  -Night pain    PRECAUTIONS: None  WEIGHT BEARING RESTRICTIONS: No  FALLS: Has patient fallen in last 6 months? No  Living Environment Lives with:  lives with fiance Lives in: House/apartment Stairs: Yes: Internal: 2 steps; none 2 steps down to den. 4 steps in. Notable difficulty with stair negotiation.  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 Mostly standing, moving items. Lifts up to 30 lbs.   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   Patient Goals: Able to move better without hurting significantly    OBJECTIVE (data from initial evaluation unless otherwise dated):    Patient Surveys  Modified Oswestry 20/50  = 40%  GAIT: Distance walked: 40 ft  Assistive device utilized: None Level of assistance: SBA Comments: Wide BOS, ipsilateral thoracolumbar sidebend during stance phase of gait, mild pelvic drop bilat  AROM AROM (Normal range in degrees) AROM  11/01/23  Lumbar   Flexion (65) 50%*  Extension (30) 50% (not hurting, "stiff")  Right lateral flexion (25) WNL  Left lateral flexion (25) WNL (slower to access ROM)*  Right rotation (30) WNL  Left rotation (30) WNL      Hip Right Left  Flexion (125) 100* 120  Extension (15)    Abduction (40) 40   Adduction     Internal Rotation (45) 20*   External Rotation (45) WNL*       (* = pain; Blank rows = not tested)  LE MMT: MMT (out of 5) Right 11/01/23 Left 11/01/23  Hip flexion 5 4  Hip extension    Hip abduction 4-* 4-  Hip adduction 5 5  Hip internal  rotation    Hip external rotation    Knee flexion 5 5  Knee extension 5 5  Ankle dorsiflexion    Ankle plantarflexion    Ankle inversion    Ankle eversion    (* = pain; Blank rows = not tested)  Muscle Length Hamstrings: R: Positive L: Positive [place] (quadriceps): R: Not examined L: Not examined   Palpation Location Right Left         Lumbar paraspinals    Quadratus Lumborum 0   Gluteus Maximus 1   Gluteus Medius 2   Deep hip external rotators 2   PSIS    Fortin's Area (SIJ) 2   Greater Trochanter    (Blank rows = not tested) Graded on 0-4 scale (0 = no pain, 1 = pain, 2 = pain with wincing/grimacing/flinching, 3 = pain with withdrawal, 4 = unwilling to allow palpation)  Special Tests Lumbar Radiculopathy and Discogenic: Centralization and Peripheralization (SN 92, -LR 0.12): Not examined Slump (SN 83, -LR 0.32): R: Negative L: Positive SLR (SN 92, -LR 0.29): R: Negative L:  Negative Crossed SLR (SP 90): R: Negative L: Negative General lumbar traction: Moderate relief of gluteal pain  Facet Joint: Extension-Rotation (SN 100, -LR 0.0): R: Negative L: Negative  Lumbar Foraminal Stenosis: Lumbar quadrant (SN 70): R: Negative L: Negative  Hip: FABER (SN 81): R: Negative L: Positive FADIR (SN 94): R: Not examined L: Not examined Hip scour (SN 50): R: Negative L: Positive  SIJ:  Thigh Thrust (SN 88, -LR 0.18) : R: Negative L: Positive Anterior Primary Stress: Positive Posterior Primary Stress: Positive     TODAY'S TREATMENT: DATE: 11/21/2023   SUBJECTIVE STATEMENT:   Pt reports typical soreness from last visit. She has pain primarily affecting L gluteal region at arrival to PT. Patient reports R side is "okay." More pain along L gluteal region and L low back.    Thoracolumbar AROM: Flexion 75% (pull in hamstrings), Extension 75%*, Lateral flexion R/L 100% (pain to L in low back), [facility]     Therapeutic Exercise - for improved soft tissue flexibility and  extensibility as needed for ROM, improved strength as needed to improve performance of CKC activities/functional movements  Repeated extension in lying, pt leaning on table; 2x10  -remaining pain in low back, no pain in gluteal region (pain centralized in lower lumbar region)  Lower trunk rotation; 1x10 R/L Piriformis stretch; reviewed   PATIENT EDUCATION: discussed expectations with use of repeated extension program, centralization phenomenon, role of repeated movement program. HEP updated to include REIL (prone press-up) in place of repeated standing lumbar extension.   *not today* Lower trunk rotations; 1 x 10 alternating R/L Supine active hamstring stretch (  nerve floss technique); reviewed   Manual Therapy - for symptom modulation, soft tissue sensitivity and mobility, joint mobility, ROM    STM and IASTM with Hypervolt along L>R L4-S1 longissimus lumborum, emphasis on L>R lumbar paraspinals ; x 12 minutes Prone CPA L4-5, gr II for pain control; 2 x 30 sec bouts   *not today* Manual lumbar traction, 10 sec intermittent holds; x 5 min    Trigger Point Dry Needling  Subsequent Treatment: Instructions provided previously at initial dry needling treatment.   Patient Verbal Consent Given: Yes Education Handout Provided: Previously Provided Muscles Treated: R and L iliocostalis lumborum at L4-5, Bilateral multifidus at L5 level; with 0.30 x 70 needles Electrical Stimulation Performed: No Treatment Response/Outcome: Mild post-treatment soreness     PATIENT EDUCATION:  Education details: see above for patient education details Person educated: Patient Education method: Explanation, Demonstration, and Handouts Education comprehension: verbalized understanding and returned demonstration   HOME EXERCISE PROGRAM:  Access Code: [email or web address] URL: [email or web address] Date: 11/08/2023 Prepared by: Consuela Mimes  Exercises - Standing Lumbar Extension with  Counter  - 3 x daily - 7 x weekly - 2 sets - 10 reps - 1 hold - Hooklying Lumbar Traction  - 2 x daily - 7 x weekly - 10 reps - 5-10sec hold - Supine Piriformis Stretch with Foot on Ground  - 2 x daily - 7 x weekly - 3 sets - 30sec hold - Supine Hamstring Stretch  - 2 x daily - 7 x weekly - 2 sets - 10 reps - 1sec hold   ASSESSMENT:  CLINICAL IMPRESSION: Patient has pain centralized along axial lower lumbar region with minimal gluteal symptoms following repeated extension in lying. Pt states today that she prefers this technique over standing extension, and this may be more effective with unloading spine to complete repeated movement. Pt exhibits normal frontal and transverse plane ROM - some pain with lateral flexion toward L noted today. Pt to continue with REIL/prone press-up in place of standing lumbar extension for her HEP. Patient has remaining deficits in thoracolumbar flexion/extension and L lateral flexion ROM, gait changes, gluteal strength, and taut/tender gluteal and deep hip ER mm. Pt will continue to benefit from skilled PT services to address deficits and improve function.   OBJECTIVE IMPAIRMENTS: Abnormal gait, difficulty walking, decreased ROM, decreased strength, impaired flexibility, and pain.   ACTIVITY LIMITATIONS: carrying, lifting, bending, squatting, sleeping, stairs, transfers, and bed mobility  PARTICIPATION LIMITATIONS: meal prep, cleaning, laundry, shopping, community activity, and occupation  PERSONAL FACTORS: Past/current experiences, Time since onset of injury/illness/exacerbation, and 1-2+ comorbidities: (HLD, migraine disorder) are also affecting patient's functional outcome.   REHAB POTENTIAL: Good  CLINICAL DECISION MAKING: Unstable/unpredictable  EVALUATION COMPLEXITY: High   GOALS: Goals reviewed with patient? Yes  SHORT TERM GOALS: Target date: 11/24/2023  Pt will be independent with HEP in order to improve strength and decrease back pain to improve  pain-free function at home and work. Baseline: 11/01/23: Baseline HEP initiated/Med[employer] handout given.  Goal status: INITIAL   LONG TERM GOALS: Target date: 12/15/2023  Patient will have full thoracolumbar AROM without reproduction of pain as needed for reaching items on ground, household chores, bending. Baseline: 11/01/22: Pain and motion loss with flexion and extension, pain with L lateral flexion  Goal status: INITIAL  2.  Pt will decrease worst back pain by at least 2 points on the NPRS in order to demonstrate clinically significant reduction in back pain. Baseline: 11/01/22: 10/10 pain at worst Goal status: INITIAL  3.  Pt will decrease mODI score by at least 13 points in order demonstrate clinically significant reduction in back pain/disability.       Baseline: 11/01/22: 20/50 Goal status: INITIAL  4.  Patient will demonstrate box lift with 30 lbs from floor to waist without increase in pain > 1-2/10 and proper body mechanics indicative of ability to maintain physical demands of her work.  Baseline: 11/01/22: Notable pain with lifting, reaching to floor.  Goal status: INITIAL   PLAN: PT FREQUENCY: 1-2x/week  PT DURATION: 6 weeks  PLANNED INTERVENTIONS: Therapeutic exercises, Therapeutic activity, Neuromuscular re-education, Balance training, Gait training, Patient/Family education, Self Care, Joint mobilization, Joint manipulation, Vestibular training, Canalith repositioning, Orthotic/Fit training, DME instructions, Dry Needling, Electrical stimulation, Spinal manipulation, Spinal mobilization, Cryotherapy, Moist heat, Taping, Traction, Ultrasound, Ionotophoresis 4mg /ml Dexamethasone, Manual therapy, and Re-evaluation.  PLAN FOR NEXT SESSION: Traction/distraction techniques for symptom modulation, STM for gluteal mm; dry needling at future visits prn. Initiate isometrics and gluteal strengthening/trunk stabilization as tolerated. F/u on response with repeated extension.    Consuela Mimes, PT, DPT #[id number] Gertie Exon, PT 11/21/2023, 1:58 PM

## 2023-11-22 ENCOUNTER — Ambulatory Visit: Admitting: Student in an Organized Health Care Education/Training Program

## 2023-11-22 ENCOUNTER — Ambulatory Visit: Payer: 59 | Admitting: Physical Therapy

## 2023-11-22 DIAGNOSIS — M5459 Other low back pain: Secondary | ICD-10-CM

## 2023-11-22 DIAGNOSIS — R262 Difficulty in walking, not elsewhere classified: Secondary | ICD-10-CM

## 2023-11-22 DIAGNOSIS — M6281 Muscle weakness (generalized): Secondary | ICD-10-CM

## 2023-11-22 DIAGNOSIS — M25552 Pain in left hip: Secondary | ICD-10-CM

## 2023-11-24 ENCOUNTER — Ambulatory Visit: Payer: 59 | Admitting: Physical Therapy

## 2023-11-29 ENCOUNTER — Encounter: Payer: Self-pay | Admitting: Physical Therapy

## 2023-11-29 ENCOUNTER — Ambulatory Visit: Payer: 59 | Attending: Physician Assistant | Admitting: Physical Therapy

## 2023-11-29 DIAGNOSIS — M25551 Pain in right hip: Secondary | ICD-10-CM | POA: Diagnosis present

## 2023-11-29 DIAGNOSIS — M25552 Pain in left hip: Secondary | ICD-10-CM | POA: Diagnosis present

## 2023-11-29 DIAGNOSIS — R262 Difficulty in walking, not elsewhere classified: Secondary | ICD-10-CM | POA: Insufficient documentation

## 2023-11-29 DIAGNOSIS — M6281 Muscle weakness (generalized): Secondary | ICD-10-CM | POA: Insufficient documentation

## 2023-11-29 DIAGNOSIS — M5459 Other low back pain: Secondary | ICD-10-CM | POA: Diagnosis present

## 2023-11-29 NOTE — Therapy (Unsigned)
 OUTPATIENT PHYSICAL THERAPY THORACOLUMBAR TREATMENT   Patient Name: Deborah Brandt MRN: [id number] DOB:09-26-1965, 58 y.o., female Today's Date: 11/29/2023   END OF SESSION:  PT End of Session - 11/29/23 0804     Visit Number 6    Number of Visits 13    Date for PT Re-Evaluation 12/15/23    PT Start Time 0734    PT Stop Time 0817    PT Time Calculation (min) 43 min    Activity Tolerance Patient tolerated treatment well    Behavior During Therapy [facility] for tasks assessed/performed             Past Medical History:  Diagnosis Date   Complication of anesthesia    after laparoscopy(1988) BP dropped.  Low BP after epidurals for childbirth   Diverticulitis    Gestational diabetes 1990, 2001   Heart murmur    at birth. no recent mention   Hepatic steatosis    History of kidney stones    HLD (hyperlipidemia)    Migraine    Motion sickness    boats, back seat of car   Nephrolithiasis    Pneumonia    Sensorineural hearing loss (SNHL) of left ear    Past Surgical History:  Procedure Laterality Date   BREAST BIOPSY Left 08/22/2017   neg, PREDOMINANTLY FATTY BREAST TISSUE   COLON RESECTION SIGMOID     COLONOSCOPY  2004   COLONOSCOPY WITH PROPOFOL N/A 12/02/2017   Procedure: COLONOSCOPY WITH PROPOFOL;  Surgeon: Midge Minium, MD;  Location: [facility];  Service: Endoscopy;  Laterality: N/A;   DIAGNOSTIC LAPAROSCOPY  1988   checking for endometriosis but negative [facility] [place], [place]   LUMBAR EPIDURAL INJECTION  1990 x2   [facility] [place], [place]   POLYPECTOMY  12/02/2017   Procedure: POLYPECTOMY;  Surgeon: Midge Minium, MD;  Location: [facility];  Service: Endoscopy;;   SINUS EXPLORATION  1998   [place], [place]    TUBAL LIGATION     VAGINAL DELIVERY  1990   [facility] [place], [place]   Patient Active Problem List   Diagnosis Date Noted   Shortness of breath 09/21/2023   Lumbar radiculopathy 07/20/2023   Hip  pain 08/04/2022   Shingles 04/21/2021   S/P laparoscopic-assisted sigmoidectomy 12/23/2020   Heart murmur 11/24/2020   Hyperglycemia 04/14/2020   Hearing loss 08/08/2019   Thumb pain 07/26/2018   Rash 07/26/2018   Cough 07/26/2018   Nasal obstruction 04/02/2018   Toe pain 04/02/2018   Obesity, morbid, BMI 40.0-49.9 (HCC) 04/02/2018   HLD (hyperlipidemia) 04/02/2018   Special screening for malignant neoplasms, colon    Rectal polyp    Drug-induced urticaria 09/20/2017   Routine general medical examination at a health care facility 02/08/2017   Abdominal pain, LLQ 01/21/2017   History of migraine 11/24/2016   Renal stones 11/24/2016   Diverticulitis 11/24/2016   Menopause 11/24/2016   Advance care planning 11/24/2016    PCP: Joaquim Nam, MD  REFERRING PROVIDER: Joan Flores, PA-C  REFERRING DIAG:  [phone] (ICD-10-CM) - Buttock pain  M54.50,G89.29 (ICD-10-CM) - Chronic midline low back pain without sciatica    RATIONALE FOR EVALUATION AND TREATMENT: Rehabilitation  THERAPY DIAG: Bilateral hip pain  Other low back pain  Difficulty in walking, not elsewhere classified  Muscle weakness (generalized)  ONSET DATE: 30 years + of episodic low back pain   FOLLOW-UP APPT SCHEDULED WITH REFERRING PROVIDER: No ; no formal f/u scheduled  PERTINENT HISTORY: Pt is  a 58 year old female with Hx of chronic episodic pain in bilateral buttock and low back. Patient reports pain in buttocks. Pt reports fear of falling secondary to pain. Pt is having cortisone shot in April. She wants to work in garden, but she is unable to at this time due to pain. Patient reports no numbness/tingling/paresthesias. Patient reports difficulty sleeping - this is unusual for her. Pt reports notable sensation of tightness in glutes. Pt is getting married 12/11/23.   PAIN:    Pain Intensity: Present: 8/10, Best: 3/10, Worst: 10/10 Pain location: Gluteal region L>R with pain down to ischial region Pain  Quality: burning, dull, and aching  Radiating: No  Numbness/Tingling: No Focal Weakness: No Aggravating factors: picking up items from ground, lifting boxes up/down Relieving factors: heating pad (not sure if it helps) 24-hour pain behavior: worse in evening/PM, notable pain in AM and difficulty with bending to turn off her fan  History of prior back injury, pain, surgery, or therapy: Yes; longstanding back pain, Hx of epidurals with both pregnancies  Imaging: Yes ;  Narrative & Impression  CLINICAL DATA:  Lumbar radiculopathy. Symptoms persist with greater than 6 weeks of treatment. Low back pain over the last 30 years which is worsening over time   EXAM: MRI LUMBAR SPINE WITHOUT CONTRAST   TECHNIQUE: Multiplanar, multisequence MR imaging of the lumbar spine was performed. No intravenous contrast was administered.   COMPARISON:  Radiography 08/02/2022   FINDINGS: Segmentation:  5 lumbar type vertebral bodies.   Alignment:  Normal   Vertebrae:  No fracture or focal bone lesion.   Conus medullaris and cauda equina: Conus extends to the L1 level. Conus and cauda equina appear normal.   Paraspinal and other soft tissues: Negative   Disc levels:   T10-11: Bilateral facet degeneration. No compressive stenosis. No disc pathology.   T11-12 through L1-2: Normal   L2-3: Degeneration of the disc with desiccation and mild bulging. Slight indentation of the thecal sac but no neural compression. No edematous endplate marrow changes or facet arthritis.   L3-4: Minimal desiccation and bulging of the disc.  No stenosis.   L4-5: Minimal desiccation and bulging of the disc. Minimal facet hypertrophy. No stenosis. The facet arthritis could possibly contribute to low back pain.   L5-S1: No disc abnormality. Bilateral facet osteoarthritis without slippage or encroachment. This could contribute to low back pain.   IMPRESSION: 1. L2-3: Degeneration of the disc with desiccation and  mild bulging. Slight indentation of the thecal sac but no neural compression. 2. L4-5: Minimal desiccation and bulging of the disc. Minimal facet hypertrophy. No stenosis. The facet arthritis could possibly contribute to low back pain. 3. L5-S1: Bilateral facet osteoarthritis without slippage or encroachment. This could contribute to low back pain.    Red flags: Negative for bowel/bladder changes, saddle paresthesia, personal history of cancer, h/o spinal tumors, h/o compression fx, h/o abdominal aneurysm, abdominal pain, chills/fever, night sweats, nausea, vomiting, unrelenting pain, first onset of insidious LBP <20 y/o  -Night pain    PRECAUTIONS: None  WEIGHT BEARING RESTRICTIONS: No  FALLS: Has patient fallen in last 6 months? No  Living Environment Lives with:  lives with fiance Lives in: House/apartment Stairs: Yes: Internal: 2 steps; none 2 steps down to den. 4 steps in. Notable difficulty with stair negotiation.  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 Mostly standing, moving items. Lifts up  to 30 lbs.   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   Patient Goals: Able to move better without hurting significantly    OBJECTIVE (data from initial evaluation unless otherwise dated):    Patient Surveys  Modified Oswestry 20/50  = 40%  GAIT: Distance walked: 40 ft  Assistive device utilized: None Level of assistance: SBA Comments: Wide BOS, ipsilateral thoracolumbar sidebend during stance phase of gait, mild pelvic drop bilat   AROM AROM (Normal range in degrees) AROM  11/01/23  Lumbar   Flexion (65) 50%*  Extension (30) 50% (not hurting, "stiff")  Right lateral flexion (25) WNL  Left lateral flexion (25) WNL (slower to access ROM)*  Right rotation (30) WNL  Left rotation (30) WNL      Hip Right Left   Flexion (125) 100* 120  Extension (15)    Abduction (40) 40   Adduction     Internal Rotation (45) 20*   External Rotation (45) WNL*       (* = pain; Blank rows = not tested)  LE MMT: MMT (out of 5) Right 11/01/23 Left 11/01/23  Hip flexion 5 4  Hip extension    Hip abduction 4-* 4-  Hip adduction 5 5  Hip internal rotation    Hip external rotation    Knee flexion 5 5  Knee extension 5 5  Ankle dorsiflexion    Ankle plantarflexion    Ankle inversion    Ankle eversion    (* = pain; Blank rows = not tested)  Muscle Length Hamstrings: R: Positive L: Positive E[place] (quadriceps): R: Not examined L: Not examined   Palpation Location Right Left         Lumbar paraspinals    Quadratus Lumborum 0   Gluteus Maximus 1   Gluteus Medius 2   Deep hip external rotators 2   PSIS    Fortin's Area (SIJ) 2   Greater Trochanter    (Blank rows = not tested) Graded on 0-4 scale (0 = no pain, 1 = pain, 2 = pain with wincing/grimacing/flinching, 3 = pain with withdrawal, 4 = unwilling to allow palpation)  Special Tests Lumbar Radiculopathy and Discogenic: Centralization and Peripheralization (SN 92, -LR 0.12): Not examined Slump (SN 83, -LR 0.32): R: Negative L: Positive SLR (SN 92, -LR 0.29): R: Negative L:  Negative Crossed SLR (SP 90): R: Negative L: Negative General lumbar traction: Moderate relief of gluteal pain  Facet Joint: Extension-Rotation (SN 100, -LR 0.0): R: Negative L: Negative  Lumbar Foraminal Stenosis: Lumbar quadrant (SN 70): R: Negative L: Negative  Hip: FABER (SN 81): R: Negative L: Positive FADIR (SN 94): R: Not examined L: Not examined Hip scour (SN 50): R: Negative L: Positive  SIJ:  Thigh Thrust (SN 88, -LR 0.18) : R: Negative L: Positive Anterior Primary Stress: Positive Posterior Primary Stress: Positive     TODAY'S TREATMENT: DATE: 11/29/2023   SUBJECTIVE STATEMENT:   Pt reports feeling better overall in regard to symptoms. Pt feels that  L-spine referral etiology suggested by PT is consistent with the condition she has dealt with. Patient reports pain along L posterolateral gluteal region - "not the worst it's ever been." Patient reports 6/10 pain at arrival to PT today. Pt reports compliance with HEP.    Thoracolumbar AROM: Flexion 75% (pull in hamstrings), Extension 50%, Lateral flexion R/L 100% (pain along contralateral hip/hamstring region), Rotation [facility]     Therapeutic Exercise - for improved soft tissue flexibility and  extensibility as needed for ROM, improved strength as needed to improve performance of CKC activities/functional movements  Repeated extension in lying, pt leaning on table; 1x10  -remaining pain in low back, "better"  Lower trunk rotation; 1x10 R/L   PATIENT EDUCATION: discussed ongoing HEP and continued work on repeated extension in lying.    *not today* Piriformis stretch; reviewed Lower trunk rotations; 1 x 10 alternating R/L Supine active hamstring stretch (nerve floss technique); reviewed   Manual Therapy - for symptom modulation, soft tissue sensitivity and mobility, joint mobility, ROM    STM along L>R L4-S1 longissimus lumborum, emphasis on L>R lumbar paraspinals ; x 15 minutes    *not today* Prone CPA L4-5, gr II for pain control; 2 x 30 sec bouts Manual lumbar traction, 10 sec intermittent holds; x 5 min    Trigger Point Dry Needling  Subsequent Treatment: Instructions provided previously at initial dry needling treatment.   Patient Verbal Consent Given: Yes Education Handout Provided: Previously Provided Muscles Treated: Bilateral multifidus at L3 and L5 level, kept in for e-stim; with 0.30 x 70 needles Electrical Stimulation Performed: Yes, 4 milliamp intensity with 5 pps frequency for 7.5 min, 4 microamp intensity with 100 pps frequency for 7.5 min Treatment Response/Outcome: Mild post-treatment soreness     PATIENT EDUCATION:  Education details: see above for  patient education details Person educated: Patient Education method: Explanation, Demonstration, and Handouts Education comprehension: verbalized understanding and returned demonstration   HOME EXERCISE PROGRAM:  Access Code: [email or web address] URL: [email or web address] Date: 11/08/2023 Prepared by: Consuela Mimes  Exercises - Standing Lumbar Extension with Counter  - 3 x daily - 7 x weekly - 2 sets - 10 reps - 1 hold - Hooklying Lumbar Traction  - 2 x daily - 7 x weekly - 10 reps - 5-10sec hold - Supine Piriformis Stretch with Foot on Ground  - 2 x daily - 7 x weekly - 3 sets - 30sec hold - Supine Hamstring Stretch  - 2 x daily - 7 x weekly - 2 sets - 10 reps - 1sec hold   ASSESSMENT:  CLINICAL IMPRESSION: Patient reports improved intensity of symptoms versus previous sessions. She does have ongoing pain in gluteal region and intermittent low back pain; symptoms in lying are improved following repeated extension today. We utilized dry needling with addition of e-stim to treat via radiculupathic model and for further symptom modulation via gate theory. Pt has moderate soreness from treatment today, but tolerates treatment relatively well. Patient has remaining deficits in thoracolumbar flexion/extension and L lateral flexion ROM, gait changes, gluteal strength, and taut/tender gluteal and deep hip ER mm. Pt will continue to benefit from skilled PT services to address deficits and improve function.   OBJECTIVE IMPAIRMENTS: Abnormal gait, difficulty walking, decreased ROM, decreased strength, impaired flexibility, and pain.   ACTIVITY LIMITATIONS: carrying, lifting, bending, squatting, sleeping, stairs, transfers, and bed mobility  PARTICIPATION LIMITATIONS: meal prep, cleaning, laundry, shopping, community activity, and occupation  PERSONAL FACTORS: Past/current experiences, Time since onset of injury/illness/exacerbation, and 1-2+ comorbidities: (HLD, migraine disorder) are also  affecting patient's functional outcome.   REHAB POTENTIAL: Good  CLINICAL DECISION MAKING: Unstable/unpredictable  EVALUATION COMPLEXITY: High   GOALS: Goals reviewed with patient? Yes  SHORT TERM GOALS: Target date: 11/24/2023  Pt will be independent with HEP in order to improve strength and decrease back pain to improve pain-free function at home and work. Baseline: 11/01/23: Baseline HEP initiated/MedBridge handout given.  Goal status: INITIAL   LONG TERM GOALS: Target  date: 12/15/2023  Patient will have full thoracolumbar AROM without reproduction of pain as needed for reaching items on ground, household chores, bending. Baseline: 11/01/22: Pain and motion loss with flexion and extension, pain with L lateral flexion  Goal status: INITIAL  2.  Pt will decrease worst back pain by at least 2 points on the NPRS in order to demonstrate clinically significant reduction in back pain. Baseline: 11/01/22: 10/10 pain at worst Goal status: INITIAL  3.  Pt will decrease mODI score by at least 13 points in order demonstrate clinically significant reduction in back pain/disability.       Baseline: 11/01/22: 20/50 Goal status: INITIAL  4.  Patient will demonstrate box lift with 30 lbs from floor to waist without increase in pain > 1-2/10 and proper body mechanics indicative of ability to maintain physical demands of her work.  Baseline: 11/01/22: Notable pain with lifting, reaching to floor.  Goal status: INITIAL   PLAN: PT FREQUENCY: 1-2x/week  PT DURATION: 6 weeks  PLANNED INTERVENTIONS: Therapeutic exercises, Therapeutic activity, Neuromuscular re-education, Balance training, Gait training, Patient/Family education, Self Care, Joint mobilization, Joint manipulation, Vestibular training, Canalith repositioning, Orthotic/Fit training, DME instructions, Dry Needling, Electrical stimulation, Spinal manipulation, Spinal mobilization, Cryotherapy, Moist heat, Taping, Traction, Ultrasound,  Ionotophoresis 4mg /ml Dexamethasone, Manual therapy, and Re-evaluation.  PLAN FOR NEXT SESSION: Traction/distraction techniques for symptom modulation, STM for gluteal mm; dry needling at future visits prn. Initiate isometrics and gluteal strengthening/trunk stabilization as tolerated. F/u on response with repeated extension.    Consuela Mimes, PT, DPT #[id number] Gertie Exon, PT 11/29/2023, 8:52 AM

## 2023-12-01 ENCOUNTER — Encounter: Payer: Self-pay | Admitting: Physical Therapy

## 2023-12-01 ENCOUNTER — Ambulatory Visit: Payer: 59 | Admitting: Physical Therapy

## 2023-12-01 DIAGNOSIS — M25551 Pain in right hip: Secondary | ICD-10-CM

## 2023-12-01 DIAGNOSIS — M5459 Other low back pain: Secondary | ICD-10-CM

## 2023-12-01 DIAGNOSIS — M6281 Muscle weakness (generalized): Secondary | ICD-10-CM

## 2023-12-01 DIAGNOSIS — R262 Difficulty in walking, not elsewhere classified: Secondary | ICD-10-CM

## 2023-12-01 NOTE — Therapy (Signed)
 OUTPATIENT PHYSICAL THERAPY THORACOLUMBAR TREATMENT   Patient Name: Deborah Brandt MRN: [id number] DOB:04-28-1966, 58 y.o., female Today's Date: 12/01/2023   END OF SESSION:  PT End of Session - 12/01/23 0733     Visit Number 7    Number of Visits 13    Date for PT Re-Evaluation 12/15/23    PT Start Time 0733    PT Stop Time 0815    PT Time Calculation (min) 42 min    Activity Tolerance Patient tolerated treatment well    Behavior During Therapy [facility] for tasks assessed/performed             Past Medical History:  Diagnosis Date   Complication of anesthesia    after laparoscopy(1988) BP dropped.  Low BP after epidurals for childbirth   Diverticulitis    Gestational diabetes 1990, 2001   Heart murmur    at birth. no recent mention   Hepatic steatosis    History of kidney stones    HLD (hyperlipidemia)    Migraine    Motion sickness    boats, back seat of car   Nephrolithiasis    Pneumonia    Sensorineural hearing loss (SNHL) of left ear    Past Surgical History:  Procedure Laterality Date   BREAST BIOPSY Left 08/22/2017   neg, PREDOMINANTLY FATTY BREAST TISSUE   COLON RESECTION SIGMOID     COLONOSCOPY  2004   COLONOSCOPY WITH PROPOFOL N/A 12/02/2017   Procedure: COLONOSCOPY WITH PROPOFOL;  Surgeon: Midge Minium, MD;  Location: [facility];  Service: Endoscopy;  Laterality: N/A;   DIAGNOSTIC LAPAROSCOPY  1988   checking for endometriosis but negative [facility] [place], [place]   LUMBAR EPIDURAL INJECTION  1990 x2   [facility] [place], [place]   POLYPECTOMY  12/02/2017   Procedure: POLYPECTOMY;  Surgeon: Midge Minium, MD;  Location: [facility];  Service: Endoscopy;;   SINUS EXPLORATION  1998   [place], [place]    TUBAL LIGATION     VAGINAL DELIVERY  1990   [facility] [place], [place]   Patient Active Problem List   Diagnosis Date Noted   Shortness of breath 09/21/2023   Lumbar radiculopathy 07/20/2023    Hip pain 08/04/2022   Shingles 04/21/2021   S/P laparoscopic-assisted sigmoidectomy 12/23/2020   Heart murmur 11/24/2020   Hyperglycemia 04/14/2020   Hearing loss 08/08/2019   Thumb pain 07/26/2018   Rash 07/26/2018   Cough 07/26/2018   Nasal obstruction 04/02/2018   Toe pain 04/02/2018   Obesity, morbid, BMI 40.0-49.9 (HCC) 04/02/2018   HLD (hyperlipidemia) 04/02/2018   Special screening for malignant neoplasms, colon    Rectal polyp    Drug-induced urticaria 09/20/2017   Routine general medical examination at a health care facility 02/08/2017   Abdominal pain, LLQ 01/21/2017   History of migraine 11/24/2016   Renal stones 11/24/2016   Diverticulitis 11/24/2016   Menopause 11/24/2016   Advance care planning 11/24/2016    PCP: Joaquim Nam, MD  REFERRING PROVIDER: Joan Flores, PA-C  REFERRING DIAG:  [phone] (ICD-10-CM) - Buttock pain  M54.50,G89.29 (ICD-10-CM) - Chronic midline low back pain without sciatica    RATIONALE FOR EVALUATION AND TREATMENT: Rehabilitation  THERAPY DIAG: Bilateral hip pain  Other low back pain  Difficulty in walking, not elsewhere classified  Muscle weakness (generalized)  ONSET DATE: 30 years + of episodic low back pain   FOLLOW-UP APPT SCHEDULED WITH REFERRING PROVIDER: No ; no formal f/u scheduled  PERTINENT HISTORY: Pt is  a 58 year old female with Hx of chronic episodic pain in bilateral buttock and low back. Patient reports pain in buttocks. Pt reports fear of falling secondary to pain. Pt is having cortisone shot in April. She wants to work in garden, but she is unable to at this time due to pain. Patient reports no numbness/tingling/paresthesias. Patient reports difficulty sleeping - this is unusual for her. Pt reports notable sensation of tightness in glutes. Pt is getting married 12/11/23.   PAIN:    Pain Intensity: Present: 8/10, Best: 3/10, Worst: 10/10 Pain location: Gluteal region L>R with pain down to ischial  region Pain Quality: burning, dull, and aching  Radiating: No  Numbness/Tingling: No Focal Weakness: No Aggravating factors: picking up items from ground, lifting boxes up/down Relieving factors: heating pad (not sure if it helps) 24-hour pain behavior: worse in evening/PM, notable pain in AM and difficulty with bending to turn off her fan  History of prior back injury, pain, surgery, or therapy: Yes; longstanding back pain, Hx of epidurals with both pregnancies  Imaging: Yes ;  Narrative & Impression  CLINICAL DATA:  Lumbar radiculopathy. Symptoms persist with greater than 6 weeks of treatment. Low back pain over the last 30 years which is worsening over time   EXAM: MRI LUMBAR SPINE WITHOUT CONTRAST   TECHNIQUE: Multiplanar, multisequence MR imaging of the lumbar spine was performed. No intravenous contrast was administered.   COMPARISON:  Radiography 08/02/2022   FINDINGS: Segmentation:  5 lumbar type vertebral bodies.   Alignment:  Normal   Vertebrae:  No fracture or focal bone lesion.   Conus medullaris and cauda equina: Conus extends to the L1 level. Conus and cauda equina appear normal.   Paraspinal and other soft tissues: Negative   Disc levels:   T10-11: Bilateral facet degeneration. No compressive stenosis. No disc pathology.   T11-12 through L1-2: Normal   L2-3: Degeneration of the disc with desiccation and mild bulging. Slight indentation of the thecal sac but no neural compression. No edematous endplate marrow changes or facet arthritis.   L3-4: Minimal desiccation and bulging of the disc.  No stenosis.   L4-5: Minimal desiccation and bulging of the disc. Minimal facet hypertrophy. No stenosis. The facet arthritis could possibly contribute to low back pain.   L5-S1: No disc abnormality. Bilateral facet osteoarthritis without slippage or encroachment. This could contribute to low back pain.   IMPRESSION: 1. L2-3: Degeneration of the disc with  desiccation and mild bulging. Slight indentation of the thecal sac but no neural compression. 2. L4-5: Minimal desiccation and bulging of the disc. Minimal facet hypertrophy. No stenosis. The facet arthritis could possibly contribute to low back pain. 3. L5-S1: Bilateral facet osteoarthritis without slippage or encroachment. This could contribute to low back pain.    Red flags: Negative for bowel/bladder changes, saddle paresthesia, personal history of cancer, h/o spinal tumors, h/o compression fx, h/o abdominal aneurysm, abdominal pain, chills/fever, night sweats, nausea, vomiting, unrelenting pain, first onset of insidious LBP <20 y/o  -Night pain    PRECAUTIONS: None  WEIGHT BEARING RESTRICTIONS: No  FALLS: Has patient fallen in last 6 months? No  Living Environment Lives with:  lives with fiance Lives in: House/apartment Stairs: Yes: Internal: 2 steps; none 2 steps down to den. 4 steps in. Notable difficulty with stair negotiation.  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 Mostly standing, moving items. Lifts up  to 30 lbs.   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   Patient Goals: Able to move better without hurting significantly    OBJECTIVE (data from initial evaluation unless otherwise dated):    Patient Surveys  Modified Oswestry 20/50  = 40%  GAIT: Distance walked: 40 ft  Assistive device utilized: None Level of assistance: SBA Comments: Wide BOS, ipsilateral thoracolumbar sidebend during stance phase of gait, mild pelvic drop bilat   AROM AROM (Normal range in degrees) AROM  11/01/23  Lumbar   Flexion (65) 50%*  Extension (30) 50% (not hurting, "stiff")  Right lateral flexion (25) WNL  Left lateral flexion (25) WNL (slower to access ROM)*  Right rotation (30) WNL  Left rotation (30) WNL      Hip  Right Left  Flexion (125) 100* 120  Extension (15)    Abduction (40) 40   Adduction     Internal Rotation (45) 20*   External Rotation (45) WNL*       (* = pain; Blank rows = not tested)  LE MMT: MMT (out of 5) Right 11/01/23 Left 11/01/23  Hip flexion 5 4  Hip extension    Hip abduction 4-* 4-  Hip adduction 5 5  Hip internal rotation    Hip external rotation    Knee flexion 5 5  Knee extension 5 5  Ankle dorsiflexion    Ankle plantarflexion    Ankle inversion    Ankle eversion    (* = pain; Blank rows = not tested)  Muscle Length Hamstrings: R: Positive L: Positive E[place] (quadriceps): R: Not examined L: Not examined   Palpation Location Right Left         Lumbar paraspinals    Quadratus Lumborum 0   Gluteus Maximus 1   Gluteus Medius 2   Deep hip external rotators 2   PSIS    Fortin's Area (SIJ) 2   Greater Trochanter    (Blank rows = not tested) Graded on 0-4 scale (0 = no pain, 1 = pain, 2 = pain with wincing/grimacing/flinching, 3 = pain with withdrawal, 4 = unwilling to allow palpation)  Special Tests Lumbar Radiculopathy and Discogenic: Centralization and Peripheralization (SN 92, -LR 0.12): Not examined Slump (SN 83, -LR 0.32): R: Negative L: Positive SLR (SN 92, -LR 0.29): R: Negative L:  Negative Crossed SLR (SP 90): R: Negative L: Negative General lumbar traction: Moderate relief of gluteal pain  Facet Joint: Extension-Rotation (SN 100, -LR 0.0): R: Negative L: Negative  Lumbar Foraminal Stenosis: Lumbar quadrant (SN 70): R: Negative L: Negative  Hip: FABER (SN 81): R: Negative L: Positive FADIR (SN 94): R: Not examined L: Not examined Hip scour (SN 50): R: Negative L: Positive  SIJ:  Thigh Thrust (SN 88, -LR 0.18) : R: Negative L: Positive Anterior Primary Stress: Positive Posterior Primary Stress: Positive     TODAY'S TREATMENT: DATE: 12/01/2023   SUBJECTIVE STATEMENT:   Pt reports having comorbid dental issues. Patient reports  minimal back pain at arrival to PT. She reports episode of L gluteal pain after last visit - not apparent today. Patient reports 5/10 pain at arrival to PT.    Manual Therapy - for symptom modulation, soft tissue sensitivity and mobility, joint mobility, ROM   STM along L>R L4-S1 longissimus lumborum, emphasis on L>R lumbar paraspinals ; x 15 minutes   *not today* Manual lumbar traction, 10 sec intermittent holds; x 5 min  Prone CPA L4-5, gr II for  pain control; 2 x 30 sec bouts   Trigger Point Dry Needling  Subsequent Treatment: Instructions provided previously at initial dry needling treatment.   Patient Verbal Consent Given: Yes Education Handout Provided: Previously Provided Muscles Treated: Bilateral multifidus at L4 and L5-S1 level, kept in for e-stim; with 0.30 x 70 needles Electrical Stimulation Performed: Yes, 4 milliamp intensity with 5 pps frequency for 7.5 min, 4 microamp intensity with 100 pps frequency for 7.5 min Treatment Response/Outcome: Mild post-treatment soreness    Therapeutic Exercise - for improved soft tissue flexibility and extensibility as needed for ROM, improved strength as needed to improve performance of CKC activities/functional movements    Cat Camel; 1 x 10 alternating up/down Lower trunk rotation; 1x10 R/L   PATIENT EDUCATION: discussed ongoing HEP and continued work on repeated extension in lying.    *not today* Repeated extension in lying, pt leaning on table; 1x10 Piriformis stretch; reviewed Lower trunk rotations; 1 x 10 alternating R/L Supine active hamstring stretch (nerve floss technique); reviewed       PATIENT EDUCATION:  Education details: see above for patient education details Person educated: Patient Education method: Explanation, Demonstration, and Handouts Education comprehension: verbalized understanding and returned demonstration   HOME EXERCISE PROGRAM:  Access Code: [email or web address] URL:  [email or web address] Date: 11/08/2023 Prepared by: Consuela Mimes  Exercises - Standing Lumbar Extension with Counter  - 3 x daily - 7 x weekly - 2 sets - 10 reps - 1 hold - Hooklying Lumbar Traction  - 2 x daily - 7 x weekly - 10 reps - 5-10sec hold - Supine Piriformis Stretch with Foot on Ground  - 2 x daily - 7 x weekly - 3 sets - 30sec hold - Supine Hamstring Stretch  - 2 x daily - 7 x weekly - 2 sets - 10 reps - 1sec hold   ASSESSMENT:  CLINICAL IMPRESSION: Patient reports improvement to date with PT intervention. She has benefited from dry needling, but subjectively is still uncertain of response with addition of e-stim.  She had short-term discomfort in L gluteal region after last treatment that did improve by the following day. Pt reports L posterolateral gluteal pain after transferring into sitting today post-treatment. Pt may need additional targeted treatment for gluteal musculature or use of traction to reduce referred pain from L-spine. Patient has remaining deficits in thoracolumbar flexion/extension and L lateral flexion ROM, gait changes, gluteal strength, and taut/tender gluteal and deep hip ER mm. Pt will continue to benefit from skilled PT services to address deficits and improve function.   OBJECTIVE IMPAIRMENTS: Abnormal gait, difficulty walking, decreased ROM, decreased strength, impaired flexibility, and pain.   ACTIVITY LIMITATIONS: carrying, lifting, bending, squatting, sleeping, stairs, transfers, and bed mobility  PARTICIPATION LIMITATIONS: meal prep, cleaning, laundry, shopping, community activity, and occupation  PERSONAL FACTORS: Past/current experiences, Time since onset of injury/illness/exacerbation, and 1-2+ comorbidities: (HLD, migraine disorder) are also affecting patient's functional outcome.   REHAB POTENTIAL: Good  CLINICAL DECISION MAKING: Unstable/unpredictable  EVALUATION COMPLEXITY: High   GOALS: Goals reviewed with patient?  Yes  SHORT TERM GOALS: Target date: 11/24/2023  Pt will be independent with HEP in order to improve strength and decrease back pain to improve pain-free function at home and work. Baseline: 11/01/23: Baseline HEP initiated/MedBridge handout given.  Goal status: INITIAL   LONG TERM GOALS: Target date: 12/15/2023  Patient will have full thoracolumbar AROM without reproduction of pain as needed for reaching items on ground, household chores, bending. Baseline: 11/01/22: Pain and motion loss with  flexion and extension, pain with L lateral flexion  Goal status: INITIAL  2.  Pt will decrease worst back pain by at least 2 points on the NPRS in order to demonstrate clinically significant reduction in back pain. Baseline: 11/01/22: 10/10 pain at worst Goal status: INITIAL  3.  Pt will decrease mODI score by at least 13 points in order demonstrate clinically significant reduction in back pain/disability.       Baseline: 11/01/22: 20/50 Goal status: INITIAL  4.  Patient will demonstrate box lift with 30 lbs from floor to waist without increase in pain > 1-2/10 and proper body mechanics indicative of ability to maintain physical demands of her work.  Baseline: 11/01/22: Notable pain with lifting, reaching to floor.  Goal status: INITIAL   PLAN: PT FREQUENCY: 1-2x/week  PT DURATION: 6 weeks  PLANNED INTERVENTIONS: Therapeutic exercises, Therapeutic activity, Neuromuscular re-education, Balance training, Gait training, Patient/Family education, Self Care, Joint mobilization, Joint manipulation, Vestibular training, Canalith repositioning, Orthotic/Fit training, DME instructions, Dry Needling, Electrical stimulation, Spinal manipulation, Spinal mobilization, Cryotherapy, Moist heat, Taping, Traction, Ultrasound, Ionotophoresis 4mg /ml Dexamethasone, Manual therapy, and Re-evaluation.  PLAN FOR NEXT SESSION: Traction/distraction techniques for symptom modulation, STM for gluteal mm; dry needling at future  visits prn. Initiate isometrics and gluteal strengthening/trunk stabilization as tolerated. F/u on response with repeated extension.    Consuela Mimes, PT, DPT #[id number] Gertie Exon, PT 12/01/2023, 7:33 AM

## 2023-12-06 ENCOUNTER — Ambulatory Visit: Payer: 59 | Admitting: Physical Therapy

## 2023-12-08 ENCOUNTER — Encounter: Payer: 59 | Admitting: Physical Therapy

## 2023-12-13 ENCOUNTER — Encounter: Payer: 59 | Admitting: Physical Therapy

## 2023-12-15 ENCOUNTER — Encounter: Payer: 59 | Admitting: Physical Therapy

## 2023-12-20 ENCOUNTER — Ambulatory Visit: Admitting: Physical Therapy

## 2023-12-20 ENCOUNTER — Encounter: Payer: Self-pay | Admitting: Physical Therapy

## 2023-12-20 DIAGNOSIS — M5459 Other low back pain: Secondary | ICD-10-CM

## 2023-12-20 DIAGNOSIS — M25551 Pain in right hip: Secondary | ICD-10-CM | POA: Diagnosis not present

## 2023-12-20 DIAGNOSIS — M6281 Muscle weakness (generalized): Secondary | ICD-10-CM

## 2023-12-20 DIAGNOSIS — R262 Difficulty in walking, not elsewhere classified: Secondary | ICD-10-CM

## 2023-12-20 NOTE — Therapy (Signed)
 OUTPATIENT PHYSICAL THERAPY THORACOLUMBAR TREATMENT   Patient Name: Deborah Brandt MRN: [id number] DOB:Dec 02, 1965, 58 y.o., female Today's Date: 12/20/2023   END OF SESSION:  PT End of Session - 12/20/23 0738     Visit Number 8    Number of Visits 13    Date for PT Re-Evaluation 12/15/23    PT Start Time 0735    PT Stop Time 0818    PT Time Calculation (min) 43 min    Activity Tolerance Patient tolerated treatment well    Behavior During Therapy [facility] for tasks assessed/performed             Past Medical History:  Diagnosis Date   Complication of anesthesia    after laparoscopy(1988) BP dropped.  Low BP after epidurals for childbirth   Diverticulitis    Gestational diabetes 1990, 2001   Heart murmur    at birth. no recent mention   Hepatic steatosis    History of kidney stones    HLD (hyperlipidemia)    Migraine    Motion sickness    boats, back seat of car   Nephrolithiasis    Pneumonia    Sensorineural hearing loss (SNHL) of left ear    Past Surgical History:  Procedure Laterality Date   BREAST BIOPSY Left 08/22/2017   neg, PREDOMINANTLY FATTY BREAST TISSUE   COLON RESECTION SIGMOID     COLONOSCOPY  2004   COLONOSCOPY WITH PROPOFOL  N/A 12/02/2017   Procedure: COLONOSCOPY WITH PROPOFOL ;  Surgeon: Marnee Sink, MD;  Location: [facility];  Service: Endoscopy;  Laterality: N/A;   DIAGNOSTIC LAPAROSCOPY  1988   checking for endometriosis but negative [facility] [place], [place]   LUMBAR EPIDURAL INJECTION  1990 x2   [facility] [place], [place]   POLYPECTOMY  12/02/2017   Procedure: POLYPECTOMY;  Surgeon: Marnee Sink, MD;  Location: [facility];  Service: Endoscopy;;   SINUS EXPLORATION  1998   [place], [place]    TUBAL LIGATION     VAGINAL DELIVERY  1990   [facility] [place], [place]   Patient Active Problem List   Diagnosis Date Noted   Shortness of breath 09/21/2023   Lumbar radiculopathy 07/20/2023    Hip pain 08/04/2022   Shingles 04/21/2021   S/P laparoscopic-assisted sigmoidectomy 12/23/2020   Heart murmur 11/24/2020   Hyperglycemia 04/14/2020   Hearing loss 08/08/2019   Thumb pain 07/26/2018   Rash 07/26/2018   Cough 07/26/2018   Nasal obstruction 04/02/2018   Toe pain 04/02/2018   Obesity, morbid, BMI 40.0-49.9 (HCC) 04/02/2018   HLD (hyperlipidemia) 04/02/2018   Special screening for malignant neoplasms, colon    Rectal polyp    Drug-induced urticaria 09/20/2017   Routine general medical examination at a health care facility 02/08/2017   Abdominal pain, LLQ 01/21/2017   History of migraine 11/24/2016   Renal stones 11/24/2016   Diverticulitis 11/24/2016   Menopause 11/24/2016   Advance care planning 11/24/2016    PCP: Donnie Galea, MD  REFERRING PROVIDER: Ludwig Safer, PA-C  REFERRING DIAG:  [phone] (ICD-10-CM) - Buttock pain  M54.50,G89.29 (ICD-10-CM) - Chronic midline low back pain without sciatica    RATIONALE FOR EVALUATION AND TREATMENT: Rehabilitation  THERAPY DIAG: Bilateral hip pain  Other low back pain  Difficulty in walking, not elsewhere classified  Muscle weakness (generalized)  ONSET DATE: 30 years + of episodic low back pain   FOLLOW-UP APPT SCHEDULED WITH REFERRING PROVIDER: No ; no formal f/u scheduled  PERTINENT HISTORY: Pt is  a 58 year old female with Hx of chronic episodic pain in bilateral buttock and low back. Patient reports pain in buttocks. Pt reports fear of falling secondary to pain. Pt is having cortisone shot in April. She wants to work in garden, but she is unable to at this time due to pain. Patient reports no numbness/tingling/paresthesias. Patient reports difficulty sleeping - this is unusual for her. Pt reports notable sensation of tightness in glutes. Pt is getting married 12/11/23.   PAIN:    Pain Intensity: Present: 8/10, Best: 3/10, Worst: 10/10 Pain location: Gluteal region L>R with pain down to ischial  region Pain Quality: burning, dull, and aching  Radiating: No  Numbness/Tingling: No Focal Weakness: No Aggravating factors: picking up items from ground, lifting boxes up/down Relieving factors: heating pad (not sure if it helps) 24-hour pain behavior: worse in evening/PM, notable pain in AM and difficulty with bending to turn off her fan  History of prior back injury, pain, surgery, or therapy: Yes; longstanding back pain, Hx of epidurals with both pregnancies  Imaging: Yes ;  Narrative & Impression  CLINICAL DATA:  Lumbar radiculopathy. Symptoms persist with greater than 6 weeks of treatment. Low back pain over the last 30 years which is worsening over time   EXAM: MRI LUMBAR SPINE WITHOUT CONTRAST   TECHNIQUE: Multiplanar, multisequence MR imaging of the lumbar spine was performed. No intravenous contrast was administered.   COMPARISON:  Radiography 08/02/2022   FINDINGS: Segmentation:  5 lumbar type vertebral bodies.   Alignment:  Normal   Vertebrae:  No fracture or focal bone lesion.   Conus medullaris and cauda equina: Conus extends to the L1 level. Conus and cauda equina appear normal.   Paraspinal and other soft tissues: Negative   Disc levels:   T10-11: Bilateral facet degeneration. No compressive stenosis. No disc pathology.   T11-12 through L1-2: Normal   L2-3: Degeneration of the disc with desiccation and mild bulging. Slight indentation of the thecal sac but no neural compression. No edematous endplate marrow changes or facet arthritis.   L3-4: Minimal desiccation and bulging of the disc.  No stenosis.   L4-5: Minimal desiccation and bulging of the disc. Minimal facet hypertrophy. No stenosis. The facet arthritis could possibly contribute to low back pain.   L5-S1: No disc abnormality. Bilateral facet osteoarthritis without slippage or encroachment. This could contribute to low back pain.   IMPRESSION: 1. L2-3: Degeneration of the disc with  desiccation and mild bulging. Slight indentation of the thecal sac but no neural compression. 2. L4-5: Minimal desiccation and bulging of the disc. Minimal facet hypertrophy. No stenosis. The facet arthritis could possibly contribute to low back pain. 3. L5-S1: Bilateral facet osteoarthritis without slippage or encroachment. This could contribute to low back pain.    Red flags: Negative for bowel/bladder changes, saddle paresthesia, personal history of cancer, h/o spinal tumors, h/o compression fx, h/o abdominal aneurysm, abdominal pain, chills/fever, night sweats, nausea, vomiting, unrelenting pain, first onset of insidious LBP <20 y/o  -Night pain    PRECAUTIONS: None  WEIGHT BEARING RESTRICTIONS: No  FALLS: Has patient fallen in last 6 months? No  Living Environment Lives with:  lives with fiance Lives in: House/apartment Stairs: Yes: Internal: 2 steps; none 2 steps down to den. 4 steps in. Notable difficulty with stair negotiation.  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 Mostly standing, moving items. Lifts up  to 30 lbs.   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   Patient Goals: Able to move better without hurting significantly    OBJECTIVE (data from initial evaluation unless otherwise dated):    Patient Surveys  Modified Oswestry 20/50  = 40%  GAIT: Distance walked: 40 ft  Assistive device utilized: None Level of assistance: SBA Comments: Wide BOS, ipsilateral thoracolumbar sidebend during stance phase of gait, mild pelvic drop bilat   AROM AROM (Normal range in degrees) AROM  11/01/23  Lumbar   Flexion (65) 50%*  Extension (30) 50% (not hurting, "stiff")  Right lateral flexion (25) WNL  Left lateral flexion (25) WNL (slower to access ROM)*  Right rotation (30) WNL  Left rotation (30) WNL      Hip  Right Left  Flexion (125) 100* 120  Extension (15)    Abduction (40) 40   Adduction     Internal Rotation (45) 20*   External Rotation (45) WNL*       (* = pain; Blank rows = not tested)  LE MMT: MMT (out of 5) Right 11/01/23 Left 11/01/23  Hip flexion 5 4  Hip extension    Hip abduction 4-* 4-  Hip adduction 5 5  Hip internal rotation    Hip external rotation    Knee flexion 5 5  Knee extension 5 5  Ankle dorsiflexion    Ankle plantarflexion    Ankle inversion    Ankle eversion    (* = pain; Blank rows = not tested)  Muscle Length Hamstrings: R: Positive L: Positive [place] (quadriceps): R: Not examined L: Not examined   Palpation Location Right Left         Lumbar paraspinals    Quadratus Lumborum 0   Gluteus Maximus 1   Gluteus Medius 2   Deep hip external rotators 2   PSIS    Fortin's Area (SIJ) 2   Greater Trochanter    (Blank rows = not tested) Graded on 0-4 scale (0 = no pain, 1 = pain, 2 = pain with wincing/grimacing/flinching, 3 = pain with withdrawal, 4 = unwilling to allow palpation)  Special Tests Lumbar Radiculopathy and Discogenic: Centralization and Peripheralization (SN 92, -LR 0.12): Not examined Slump (SN 83, -LR 0.32): R: Negative L: Positive SLR (SN 92, -LR 0.29): R: Negative L:  Negative Crossed SLR (SP 90): R: Negative L: Negative General lumbar traction: Moderate relief of gluteal pain  Facet Joint: Extension-Rotation (SN 100, -LR 0.0): R: Negative L: Negative  Lumbar Foraminal Stenosis: Lumbar quadrant (SN 70): R: Negative L: Negative  Hip: FABER (SN 81): R: Negative L: Positive FADIR (SN 94): R: Not examined L: Not examined Hip scour (SN 50): R: Negative L: Positive  SIJ:  Thigh Thrust (SN 88, -LR 0.18) : R: Negative L: Positive Anterior Primary Stress: Positive Posterior Primary Stress: Positive     TODAY'S TREATMENT: DATE: 12/20/2023   SUBJECTIVE STATEMENT:   Pt reports notable tightness from prolonged walking in  [place], [place] during wedding/vacation over previous 11 days. Pt reports L gluteal pain at arrival. She reports lower back pain at arrival. Patient reports compliance with her HEP; she feels that extension helps.    Manual Therapy - for symptom modulation, soft tissue sensitivity and mobility, joint mobility, ROM    L long-leg distraction, intermittent 10-sec holds; x 5 minutes STM along L>R L4-S1 longissimus lumborum, emphasis on L>R lumbar paraspinals ; x 15 minutes   *not today* Manual lumbar traction,  10 sec intermittent holds; x 5 min  Prone CPA L4-5, gr II for pain control; 2 x 30 sec bouts   Trigger Point Dry Needling  Subsequent Treatment: Instructions provided previously at initial dry needling treatment.   Patient Verbal Consent Given: Yes Education Handout Provided: Previously Provided Muscles Treated: L iliocostalis lumborum at L3 and L5 levels, L piriformis with 0.30 x 70 mm needles. Electrical Stimulation Performed: No Treatment Response/Outcome: Minimal post-treatment soreness    Therapeutic Exercise - for improved soft tissue flexibility and extensibility as needed for ROM, improved strength as needed to improve performance of CKC activities/functional movements   Lower trunk rotation; 1x10 R/L  Open book; 1 x 10 on R and L  Bridge; 2 x 10, 3 sec hold at top    PATIENT EDUCATION: discussed ongoing HEP and continued work on repeated extension in lying - reviewed MDT frequency.    *not today* Repeated extension in lying, pt leaning on table; 1x10 Piriformis stretch; reviewed Lower trunk rotations; 1 x 10 alternating R/L Supine active hamstring stretch (nerve floss technique); reviewed Cat Camel; 1 x 10 alternating up/down      PATIENT EDUCATION:  Education details: see above for patient education details Person educated: Patient Education method: Explanation, Demonstration, and Handouts Education comprehension: verbalized understanding and returned  demonstration   HOME EXERCISE PROGRAM:  Access Code: [email or web address] URL: [email or web address] Date: 11/08/2023 Prepared by: Denese Finn  Exercises - Standing Lumbar Extension with Counter  - 3 x daily - 7 x weekly - 2 sets - 10 reps - 1 hold - Hooklying Lumbar Traction  - 2 x daily - 7 x weekly - 10 reps - 5-10sec hold - Supine Piriformis Stretch with Foot on Ground  - 2 x daily - 7 x weekly - 3 sets - 30sec hold - Supine Hamstring Stretch  - 2 x daily - 7 x weekly - 2 sets - 10 reps - 1sec hold   ASSESSMENT:  CLINICAL IMPRESSION: Patient reports no benefit from addition of e-stim to dry needling. She reports good benefit from DN via trigger point model/needling alone; we used this along L paraspinal muscle and gluteal region today with sound twitch responses obtained. Pt tolerated addition of bridges well without significant pain. Pt reports minimal symptoms post-session. Patient has remaining deficits in thoracolumbar flexion/extension and L lateral flexion ROM, gait changes, gluteal strength, and taut/tender gluteal and deep hip ER mm. Pt will continue to benefit from skilled PT services to address deficits and improve function.   OBJECTIVE IMPAIRMENTS: Abnormal gait, difficulty walking, decreased ROM, decreased strength, impaired flexibility, and pain.   ACTIVITY LIMITATIONS: carrying, lifting, bending, squatting, sleeping, stairs, transfers, and bed mobility  PARTICIPATION LIMITATIONS: meal prep, cleaning, laundry, shopping, community activity, and occupation  PERSONAL FACTORS: Past/current experiences, Time since onset of injury/illness/exacerbation, and 1-2+ comorbidities: (HLD, migraine disorder) are also affecting patient's functional outcome.   REHAB POTENTIAL: Good  CLINICAL DECISION MAKING: Unstable/unpredictable  EVALUATION COMPLEXITY: High   GOALS: Goals reviewed with patient? Yes  SHORT TERM GOALS: Target date: 11/24/2023  Pt will be independent  with HEP in order to improve strength and decrease back pain to improve pain-free function at home and work. Baseline: 11/01/23: Baseline HEP initiated/MedBridge handout given.  Goal status: INITIAL   LONG TERM GOALS: Target date: 12/15/2023  Patient will have full thoracolumbar AROM without reproduction of pain as needed for reaching items on ground, household chores, bending. Baseline: 11/01/22: Pain and motion loss with flexion and extension, pain with L  lateral flexion  Goal status: INITIAL  2.  Pt will decrease worst back pain by at least 2 points on the NPRS in order to demonstrate clinically significant reduction in back pain. Baseline: 11/01/22: 10/10 pain at worst Goal status: INITIAL  3.  Pt will decrease mODI score by at least 13 points in order demonstrate clinically significant reduction in back pain/disability.       Baseline: 11/01/22: 20/50 Goal status: INITIAL  4.  Patient will demonstrate box lift with 30 lbs from floor to waist without increase in pain > 1-2/10 and proper body mechanics indicative of ability to maintain physical demands of her work.  Baseline: 11/01/22: Notable pain with lifting, reaching to floor.  Goal status: INITIAL   PLAN: PT FREQUENCY: 1-2x/week  PT DURATION: 6 weeks  PLANNED INTERVENTIONS: Therapeutic exercises, Therapeutic activity, Neuromuscular re-education, Balance training, Gait training, Patient/Family education, Self Care, Joint mobilization, Joint manipulation, Vestibular training, Canalith repositioning, Orthotic/Fit training, DME instructions, Dry Needling, Electrical stimulation, Spinal manipulation, Spinal mobilization, Cryotherapy, Moist heat, Taping, Traction, Ultrasound, Ionotophoresis 4mg /ml Dexamethasone , Manual therapy, and Re-evaluation.  PLAN FOR NEXT SESSION: Traction/distraction techniques for symptom modulation, STM for gluteal mm; dry needling at future visits prn. Initiate isometrics and gluteal strengthening/trunk  stabilization as tolerated. F/u on response with repeated extension.    Denese Finn, PT, DPT #[id number] Aleatha Hunting, PT 12/20/2023, 8:25 AM

## 2023-12-21 NOTE — Therapy (Signed)
 OUTPATIENT PHYSICAL THERAPY THORACOLUMBAR TREATMENT   Patient Name: Deborah Brandt MRN: [id number] DOB:03-02-1966, 58 y.o., female Today's Date: 12/22/2023   END OF SESSION:  PT End of Session - 12/22/23 0737     Visit Number 9    Number of Visits 13    Date for PT Re-Evaluation 12/15/23    PT Start Time 0735    PT Stop Time 0815    PT Time Calculation (min) 40 min    Activity Tolerance Patient tolerated treatment well    Behavior During Therapy [facility] for tasks assessed/performed              Past Medical History:  Diagnosis Date   Complication of anesthesia    after laparoscopy(1988) BP dropped.  Low BP after epidurals for childbirth   Diverticulitis    Gestational diabetes 1990, 2001   Heart murmur    at birth. no recent mention   Hepatic steatosis    History of kidney stones    HLD (hyperlipidemia)    Migraine    Motion sickness    boats, back seat of car   Nephrolithiasis    Pneumonia    Sensorineural hearing loss (SNHL) of left ear    Past Surgical History:  Procedure Laterality Date   BREAST BIOPSY Left 08/22/2017   neg, PREDOMINANTLY FATTY BREAST TISSUE   COLON RESECTION SIGMOID     COLONOSCOPY  2004   COLONOSCOPY WITH PROPOFOL  N/A 12/02/2017   Procedure: COLONOSCOPY WITH PROPOFOL ;  Surgeon: Marnee Sink, MD;  Location: [facility];  Service: Endoscopy;  Laterality: N/A;   DIAGNOSTIC LAPAROSCOPY  1988   checking for endometriosis but negative [facility] [place], [place]   LUMBAR EPIDURAL INJECTION  1990 x2   [facility] [place], [place]   POLYPECTOMY  12/02/2017   Procedure: POLYPECTOMY;  Surgeon: Marnee Sink, MD;  Location: [facility];  Service: Endoscopy;;   SINUS EXPLORATION  1998   [place], [place]    TUBAL LIGATION     VAGINAL DELIVERY  1990   [facility] [place], [place]   Patient Active Problem List   Diagnosis Date Noted   Shortness of breath 09/21/2023   Lumbar radiculopathy 07/20/2023    Hip pain 08/04/2022   Shingles 04/21/2021   S/P laparoscopic-assisted sigmoidectomy 12/23/2020   Heart murmur 11/24/2020   Hyperglycemia 04/14/2020   Hearing loss 08/08/2019   Thumb pain 07/26/2018   Rash 07/26/2018   Cough 07/26/2018   Nasal obstruction 04/02/2018   Toe pain 04/02/2018   Obesity, morbid, BMI 40.0-49.9 (HCC) 04/02/2018   HLD (hyperlipidemia) 04/02/2018   Special screening for malignant neoplasms, colon    Rectal polyp    Drug-induced urticaria 09/20/2017   Routine general medical examination at a health care facility 02/08/2017   Abdominal pain, LLQ 01/21/2017   History of migraine 11/24/2016   Renal stones 11/24/2016   Diverticulitis 11/24/2016   Menopause 11/24/2016   Advance care planning 11/24/2016    PCP: Donnie Galea, MD  REFERRING PROVIDER: Ludwig Safer, PA-C  REFERRING DIAG:  [phone] (ICD-10-CM) - Buttock pain  M54.50,G89.29 (ICD-10-CM) - Chronic midline low back pain without sciatica    RATIONALE FOR EVALUATION AND TREATMENT: Rehabilitation  THERAPY DIAG: Bilateral hip pain  Other low back pain  Muscle weakness (generalized)  Difficulty in walking, not elsewhere classified  ONSET DATE: 30 years + of episodic low back pain   FOLLOW-UP APPT SCHEDULED WITH REFERRING PROVIDER: No ; no formal f/u scheduled  PERTINENT HISTORY: Pt  is a 58 year old female with Hx of chronic episodic pain in bilateral buttock and low back. Patient reports pain in buttocks. Pt reports fear of falling secondary to pain. Pt is having cortisone shot in April. She wants to work in garden, but she is unable to at this time due to pain. Patient reports no numbness/tingling/paresthesias. Patient reports difficulty sleeping - this is unusual for her. Pt reports notable sensation of tightness in glutes. Pt is getting married 12/11/23.   PAIN:    Pain Intensity: Present: 8/10, Best: 3/10, Worst: 10/10 Pain location: Gluteal region L>R with pain down to ischial  region Pain Quality: burning, dull, and aching  Radiating: No  Numbness/Tingling: No Focal Weakness: No Aggravating factors: picking up items from ground, lifting boxes up/down Relieving factors: heating pad (not sure if it helps) 24-hour pain behavior: worse in evening/PM, notable pain in AM and difficulty with bending to turn off her fan  History of prior back injury, pain, surgery, or therapy: Yes; longstanding back pain, Hx of epidurals with both pregnancies  Imaging: Yes ;  Narrative & Impression  CLINICAL DATA:  Lumbar radiculopathy. Symptoms persist with greater than 6 weeks of treatment. Low back pain over the last 30 years which is worsening over time   EXAM: MRI LUMBAR SPINE WITHOUT CONTRAST   TECHNIQUE: Multiplanar, multisequence MR imaging of the lumbar spine was performed. No intravenous contrast was administered.   COMPARISON:  Radiography 08/02/2022   FINDINGS: Segmentation:  5 lumbar type vertebral bodies.   Alignment:  Normal   Vertebrae:  No fracture or focal bone lesion.   Conus medullaris and cauda equina: Conus extends to the L1 level. Conus and cauda equina appear normal.   Paraspinal and other soft tissues: Negative   Disc levels:   T10-11: Bilateral facet degeneration. No compressive stenosis. No disc pathology.   T11-12 through L1-2: Normal   L2-3: Degeneration of the disc with desiccation and mild bulging. Slight indentation of the thecal sac but no neural compression. No edematous endplate marrow changes or facet arthritis.   L3-4: Minimal desiccation and bulging of the disc.  No stenosis.   L4-5: Minimal desiccation and bulging of the disc. Minimal facet hypertrophy. No stenosis. The facet arthritis could possibly contribute to low back pain.   L5-S1: No disc abnormality. Bilateral facet osteoarthritis without slippage or encroachment. This could contribute to low back pain.   IMPRESSION: 1. L2-3: Degeneration of the disc with  desiccation and mild bulging. Slight indentation of the thecal sac but no neural compression. 2. L4-5: Minimal desiccation and bulging of the disc. Minimal facet hypertrophy. No stenosis. The facet arthritis could possibly contribute to low back pain. 3. L5-S1: Bilateral facet osteoarthritis without slippage or encroachment. This could contribute to low back pain.    Red flags: Negative for bowel/bladder changes, saddle paresthesia, personal history of cancer, h/o spinal tumors, h/o compression fx, h/o abdominal aneurysm, abdominal pain, chills/fever, night sweats, nausea, vomiting, unrelenting pain, first onset of insidious LBP <20 y/o  -Night pain    PRECAUTIONS: None  WEIGHT BEARING RESTRICTIONS: No  FALLS: Has patient fallen in last 6 months? No  Living Environment Lives with:  lives with fiance Lives in: House/apartment Stairs: Yes: Internal: 2 steps; none 2 steps down to den. 4 steps in. Notable difficulty with stair negotiation.  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 Mostly standing, moving items. Lifts  up to 30 lbs.   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   Patient Goals: Able to move better without hurting significantly    OBJECTIVE (data from initial evaluation unless otherwise dated):    Patient Surveys  Modified Oswestry 20/50  = 40%  GAIT: Distance walked: 40 ft  Assistive device utilized: None Level of assistance: SBA Comments: Wide BOS, ipsilateral thoracolumbar sidebend during stance phase of gait, mild pelvic drop bilat   AROM AROM (Normal range in degrees) AROM  11/01/23  Lumbar   Flexion (65) 50%*  Extension (30) 50% (not hurting, "stiff")  Right lateral flexion (25) WNL  Left lateral flexion (25) WNL (slower to access ROM)*  Right rotation (30) WNL  Left rotation (30) WNL      Hip  Right Left  Flexion (125) 100* 120  Extension (15)    Abduction (40) 40   Adduction     Internal Rotation (45) 20*   External Rotation (45) WNL*       (* = pain; Blank rows = not tested)  LE MMT: MMT (out of 5) Right 11/01/23 Left 11/01/23  Hip flexion 5 4  Hip extension    Hip abduction 4-* 4-  Hip adduction 5 5  Hip internal rotation    Hip external rotation    Knee flexion 5 5  Knee extension 5 5  Ankle dorsiflexion    Ankle plantarflexion    Ankle inversion    Ankle eversion    (* = pain; Blank rows = not tested)  Muscle Length Hamstrings: R: Positive L: Positive [place] (quadriceps): R: Not examined L: Not examined   Palpation Location Right Left         Lumbar paraspinals    Quadratus Lumborum 0   Gluteus Maximus 1   Gluteus Medius 2   Deep hip external rotators 2   PSIS    Fortin's Area (SIJ) 2   Greater Trochanter    (Blank rows = not tested) Graded on 0-4 scale (0 = no pain, 1 = pain, 2 = pain with wincing/grimacing/flinching, 3 = pain with withdrawal, 4 = unwilling to allow palpation)  Special Tests Lumbar Radiculopathy and Discogenic: Centralization and Peripheralization (SN 92, -LR 0.12): Not examined Slump (SN 83, -LR 0.32): R: Negative L: Positive SLR (SN 92, -LR 0.29): R: Negative L:  Negative Crossed SLR (SP 90): R: Negative L: Negative General lumbar traction: Moderate relief of gluteal pain  Facet Joint: Extension-Rotation (SN 100, -LR 0.0): R: Negative L: Negative  Lumbar Foraminal Stenosis: Lumbar quadrant (SN 70): R: Negative L: Negative  Hip: FABER (SN 81): R: Negative L: Positive FADIR (SN 94): R: Not examined L: Not examined Hip scour (SN 50): R: Negative L: Positive  SIJ:  Thigh Thrust (SN 88, -LR 0.18) : R: Negative L: Positive Anterior Primary Stress: Positive Posterior Primary Stress: Positive     TODAY'S TREATMENT: DATE: 12/22/2023   SUBJECTIVE STATEMENT:   Pt reports feeling somewhat "tired" in gluteal region. She  reports some discomfort in L thigh with performance of bridging. Patient reports minimal pain this AM. Pt reports compliance with HEP. She was going to have steroid injection, but she elected to cancel it.    Manual Therapy - for symptom modulation, soft tissue sensitivity and mobility, joint mobility, ROM    L long-leg distraction, intermittent 10-sec holds; x 5 minutes STM along L>R L4-S1 longissimus lumborum, emphasis on L>R lumbar paraspinals ; x 15 minutes   *not today*  Manual lumbar traction, 10 sec intermittent holds; x 5 min  Prone CPA L4-5, gr II for pain control; 2 x 30 sec bouts   Trigger Point Dry Needling  Subsequent Treatment: Instructions provided previously at initial dry needling treatment.   Patient Verbal Consent Given: Yes Education Handout Provided: Previously Provided Muscles Treated: L iliocostalis lumborum at L3-4 and L5 levels, L multifidus at L4 and L5 level with 0.30 x 70 mm needles Electrical Stimulation Performed: No Treatment Response/Outcome: Minimal post-treatment soreness    Therapeutic Exercise - for improved soft tissue flexibility and extensibility as needed for ROM, improved strength as needed to improve performance of CKC activities/functional movements   Repeated extension in lying, prone press-up; 2 x 10    Lower trunk rotation; 1x15 R/L  Open book; 1 x 10 on R and L  PATIENT EDUCATION: discussed ongoing HEP and continued work on repeated extension in lying.   *not today* Bridge; 2 x 10, 3 sec hold at top Repeated extension in lying, pt leaning on table; 1x10 Piriformis stretch; reviewed Lower trunk rotations; 1 x 10 alternating R/L Supine active hamstring stretch (nerve floss technique); reviewed Cat Camel; 1 x 10 alternating up/down      PATIENT EDUCATION:  Education details: see above for patient education details Person educated: Patient Education method: Explanation, Demonstration, and Handouts Education comprehension:  verbalized understanding and returned demonstration   HOME EXERCISE PROGRAM:  Access Code: [email or web address] URL: [email or web address] Date: 11/08/2023 Prepared by: Denese Finn  Exercises - Standing Lumbar Extension with Counter  - 3 x daily - 7 x weekly - 2 sets - 10 reps - 1 hold - Hooklying Lumbar Traction  - 2 x daily - 7 x weekly - 10 reps - 5-10sec hold - Supine Piriformis Stretch with Foot on Ground  - 2 x daily - 7 x weekly - 3 sets - 30sec hold - Supine Hamstring Stretch  - 2 x daily - 7 x weekly - 2 sets - 10 reps - 1sec hold   ASSESSMENT:  CLINICAL IMPRESSION: Patient fortunately feels that she does not need steroid injection at this time for lumbar spine. She reports primarily pain along L iliolumbar/gluteal region that she reports feels "tired" and having some mild aching pain today. She does respond well with extension as primary movement and has responded well recently with DN. We will need to work further on hip and trunk/paraspinal strengthening with successive visits. Patient has remaining deficits in thoracolumbar flexion/extension and L lateral flexion ROM, gait changes, gluteal strength, and taut/tender gluteal and deep hip ER mm. Pt will continue to benefit from skilled PT services to address deficits and improve function.   OBJECTIVE IMPAIRMENTS: Abnormal gait, difficulty walking, decreased ROM, decreased strength, impaired flexibility, and pain.   ACTIVITY LIMITATIONS: carrying, lifting, bending, squatting, sleeping, stairs, transfers, and bed mobility  PARTICIPATION LIMITATIONS: meal prep, cleaning, laundry, shopping, community activity, and occupation  PERSONAL FACTORS: Past/current experiences, Time since onset of injury/illness/exacerbation, and 1-2+ comorbidities: (HLD, migraine disorder) are also affecting patient's functional outcome.   REHAB POTENTIAL: Good  CLINICAL DECISION MAKING: Unstable/unpredictable  EVALUATION COMPLEXITY:  High   GOALS: Goals reviewed with patient? Yes  SHORT TERM GOALS: Target date: 11/24/2023  Pt will be independent with HEP in order to improve strength and decrease back pain to improve pain-free function at home and work. Baseline: 11/01/23: Baseline HEP initiated/MedBridge handout given.  Goal status: INITIAL   LONG TERM GOALS: Target date: 12/15/2023  Patient will have full thoracolumbar AROM without  reproduction of pain as needed for reaching items on ground, household chores, bending. Baseline: 11/01/22: Pain and motion loss with flexion and extension, pain with L lateral flexion  Goal status: INITIAL  2.  Pt will decrease worst back pain by at least 2 points on the NPRS in order to demonstrate clinically significant reduction in back pain. Baseline: 11/01/22: 10/10 pain at worst Goal status: INITIAL  3.  Pt will decrease mODI score by at least 13 points in order demonstrate clinically significant reduction in back pain/disability.       Baseline: 11/01/22: 20/50 Goal status: INITIAL  4.  Patient will demonstrate box lift with 30 lbs from floor to waist without increase in pain > 1-2/10 and proper body mechanics indicative of ability to maintain physical demands of her work.  Baseline: 11/01/22: Notable pain with lifting, reaching to floor.  Goal status: INITIAL   PLAN: PT FREQUENCY: 1-2x/week  PT DURATION: 6 weeks  PLANNED INTERVENTIONS: Therapeutic exercises, Therapeutic activity, Neuromuscular re-education, Balance training, Gait training, Patient/Family education, Self Care, Joint mobilization, Joint manipulation, Vestibular training, Canalith repositioning, Orthotic/Fit training, DME instructions, Dry Needling, Electrical stimulation, Spinal manipulation, Spinal mobilization, Cryotherapy, Moist heat, Taping, Traction, Ultrasound, Ionotophoresis 4mg /ml Dexamethasone , Manual therapy, and Re-evaluation.  PLAN FOR NEXT SESSION: Traction/distraction techniques for symptom  modulation, STM for gluteal mm; dry needling at future visits prn. Initiate isometrics and gluteal strengthening/trunk stabilization as tolerated. F/u on response with repeated extension.    Denese Finn, PT, DPT #[id number] Aleatha Hunting, PT 12/22/2023, 7:37 AM

## 2023-12-22 ENCOUNTER — Ambulatory Visit: Attending: Physician Assistant | Admitting: Physical Therapy

## 2023-12-22 ENCOUNTER — Encounter: Payer: Self-pay | Admitting: Physical Therapy

## 2023-12-22 DIAGNOSIS — M25551 Pain in right hip: Secondary | ICD-10-CM | POA: Diagnosis present

## 2023-12-22 DIAGNOSIS — R262 Difficulty in walking, not elsewhere classified: Secondary | ICD-10-CM | POA: Insufficient documentation

## 2023-12-22 DIAGNOSIS — M25552 Pain in left hip: Secondary | ICD-10-CM | POA: Diagnosis present

## 2023-12-22 DIAGNOSIS — M5459 Other low back pain: Secondary | ICD-10-CM | POA: Diagnosis present

## 2023-12-22 DIAGNOSIS — M6281 Muscle weakness (generalized): Secondary | ICD-10-CM | POA: Diagnosis present

## 2023-12-29 ENCOUNTER — Ambulatory Visit: Payer: Self-pay | Admitting: Physical Therapy

## 2023-12-29 DIAGNOSIS — R262 Difficulty in walking, not elsewhere classified: Secondary | ICD-10-CM

## 2023-12-29 DIAGNOSIS — M25551 Pain in right hip: Secondary | ICD-10-CM | POA: Diagnosis not present

## 2023-12-29 DIAGNOSIS — M5459 Other low back pain: Secondary | ICD-10-CM

## 2023-12-29 DIAGNOSIS — M6281 Muscle weakness (generalized): Secondary | ICD-10-CM

## 2023-12-29 NOTE — Therapy (Signed)
 OUTPATIENT PHYSICAL THERAPY TREATMENT AND PROGRESS NOTE  Dates of reporting period  11/01/23   to   12/29/23  Patient Name: Deborah Brandt MRN: [id number] DOB:October 30, 1965, 58 y.o., female Today's Date: 12/29/2023   END OF SESSION:  PT End of Session - 12/29/23 0734     Visit Number 10    Number of Visits 13    Date for PT Re-Evaluation 12/15/23    PT Start Time 0735    PT Stop Time 0818    PT Time Calculation (min) 43 min    Activity Tolerance Patient tolerated treatment well    Behavior During Therapy [facility] for tasks assessed/performed             Past Medical History:  Diagnosis Date   Complication of anesthesia    after laparoscopy(1988) BP dropped.  Low BP after epidurals for childbirth   Diverticulitis    Gestational diabetes 1990, 2001   Heart murmur    at birth. no recent mention   Hepatic steatosis    History of kidney stones    HLD (hyperlipidemia)    Migraine    Motion sickness    boats, back seat of car   Nephrolithiasis    Pneumonia    Sensorineural hearing loss (SNHL) of left ear    Past Surgical History:  Procedure Laterality Date   BREAST BIOPSY Left 08/22/2017   neg, PREDOMINANTLY FATTY BREAST TISSUE   COLON RESECTION SIGMOID     COLONOSCOPY  2004   COLONOSCOPY WITH PROPOFOL  N/A 12/02/2017   Procedure: COLONOSCOPY WITH PROPOFOL ;  Surgeon: Marnee Sink, MD;  Location: [facility];  Service: Endoscopy;  Laterality: N/A;   DIAGNOSTIC LAPAROSCOPY  1988   checking for endometriosis but negative [facility] [place], [place]   LUMBAR EPIDURAL INJECTION  1990 x2   [facility] [place], [place]   POLYPECTOMY  12/02/2017   Procedure: POLYPECTOMY;  Surgeon: Marnee Sink, MD;  Location: [facility];  Service: Endoscopy;;   SINUS EXPLORATION  1998   [place], [place]    TUBAL LIGATION     VAGINAL DELIVERY  1990   [facility] [place], [place]   Patient Active Problem List   Diagnosis Date Noted   Shortness of  breath 09/21/2023   Lumbar radiculopathy 07/20/2023   Hip pain 08/04/2022   Shingles 04/21/2021   S/P laparoscopic-assisted sigmoidectomy 12/23/2020   Heart murmur 11/24/2020   Hyperglycemia 04/14/2020   Hearing loss 08/08/2019   Thumb pain 07/26/2018   Rash 07/26/2018   Cough 07/26/2018   Nasal obstruction 04/02/2018   Toe pain 04/02/2018   Obesity, morbid, BMI 40.0-49.9 (HCC) 04/02/2018   HLD (hyperlipidemia) 04/02/2018   Special screening for malignant neoplasms, colon    Rectal polyp    Drug-induced urticaria 09/20/2017   Routine general medical examination at a health care facility 02/08/2017   Abdominal pain, LLQ 01/21/2017   History of migraine 11/24/2016   Renal stones 11/24/2016   Diverticulitis 11/24/2016   Menopause 11/24/2016   Advance care planning 11/24/2016    PCP: Donnie Galea, MD  REFERRING PROVIDER: Ludwig Safer, PA-C  REFERRING DIAG:  [phone] (ICD-10-CM) - Buttock pain  M54.50,G89.29 (ICD-10-CM) - Chronic midline low back pain without sciatica    RATIONALE FOR EVALUATION AND TREATMENT: Rehabilitation  THERAPY DIAG: Bilateral hip pain  Other low back pain  Muscle weakness (generalized)  Difficulty in walking, not elsewhere classified  ONSET DATE: 30 years + of episodic low back pain   FOLLOW-UP APPT SCHEDULED  WITH REFERRING PROVIDER: No ; no formal f/u scheduled  PERTINENT HISTORY: Pt is a 58 year old female with Hx of chronic episodic pain in bilateral buttock and low back. Patient reports pain in buttocks. Pt reports fear of falling secondary to pain. Pt is having cortisone shot in April. She wants to work in garden, but she is unable to at this time due to pain. Patient reports no numbness/tingling/paresthesias. Patient reports difficulty sleeping - this is unusual for her. Pt reports notable sensation of tightness in glutes. Pt is getting married 12/11/23.   PAIN:    Pain Intensity: Present: 8/10, Best: 3/10, Worst: 10/10 Pain location:  Gluteal region L>R with pain down to ischial region Pain Quality: burning, dull, and aching  Radiating: No  Numbness/Tingling: No Focal Weakness: No Aggravating factors: picking up items from ground, lifting boxes up/down Relieving factors: heating pad (not sure if it helps) 24-hour pain behavior: worse in evening/PM, notable pain in AM and difficulty with bending to turn off her fan  History of prior back injury, pain, surgery, or therapy: Yes; longstanding back pain, Hx of epidurals with both pregnancies  Imaging: Yes ;  Narrative & Impression  CLINICAL DATA:  Lumbar radiculopathy. Symptoms persist with greater than 6 weeks of treatment. Low back pain over the last 30 years which is worsening over time   EXAM: MRI LUMBAR SPINE WITHOUT CONTRAST   TECHNIQUE: Multiplanar, multisequence MR imaging of the lumbar spine was performed. No intravenous contrast was administered.   COMPARISON:  Radiography 08/02/2022   FINDINGS: Segmentation:  5 lumbar type vertebral bodies.   Alignment:  Normal   Vertebrae:  No fracture or focal bone lesion.   Conus medullaris and cauda equina: Conus extends to the L1 level. Conus and cauda equina appear normal.   Paraspinal and other soft tissues: Negative   Disc levels:   T10-11: Bilateral facet degeneration. No compressive stenosis. No disc pathology.   T11-12 through L1-2: Normal   L2-3: Degeneration of the disc with desiccation and mild bulging. Slight indentation of the thecal sac but no neural compression. No edematous endplate marrow changes or facet arthritis.   L3-4: Minimal desiccation and bulging of the disc.  No stenosis.   L4-5: Minimal desiccation and bulging of the disc. Minimal facet hypertrophy. No stenosis. The facet arthritis could possibly contribute to low back pain.   L5-S1: No disc abnormality. Bilateral facet osteoarthritis without slippage or encroachment. This could contribute to low back pain.    IMPRESSION: 1. L2-3: Degeneration of the disc with desiccation and mild bulging. Slight indentation of the thecal sac but no neural compression. 2. L4-5: Minimal desiccation and bulging of the disc. Minimal facet hypertrophy. No stenosis. The facet arthritis could possibly contribute to low back pain. 3. L5-S1: Bilateral facet osteoarthritis without slippage or encroachment. This could contribute to low back pain.    Red flags: Negative for bowel/bladder changes, saddle paresthesia, personal history of cancer, h/o spinal tumors, h/o compression fx, h/o abdominal aneurysm, abdominal pain, chills/fever, night sweats, nausea, vomiting, unrelenting pain, first onset of insidious LBP <20 y/o  -Night pain    PRECAUTIONS: None  WEIGHT BEARING RESTRICTIONS: No  FALLS: Has patient fallen in last 6 months? No  Living Environment Lives with: lives with fiance Lives in: House/apartment Stairs: Yes: Internal: 2 steps; none 2 steps down to den. 4 steps in. Notable difficulty with stair negotiation.  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 Mostly standing, moving items. Lifts up to 30 lbs.   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   Patient Goals: Able to move better without hurting significantly    OBJECTIVE (data from initial evaluation unless otherwise dated):    Patient Surveys  Modified Oswestry 20/50  = 40%  GAIT: Distance walked: 40 ft  Assistive device utilized: None Level of assistance: SBA Comments: Wide BOS, ipsilateral thoracolumbar sidebend during stance phase of gait, mild pelvic drop bilat   AROM AROM (Normal range in degrees) AROM  11/01/23 AROM 12/29/23  Lumbar    Flexion (65) 50%* 75%* (pain in low back/L glute/thigh)  Extension (30) 50% (not hurting, "stiff") 50%  Right lateral flexion (25) WNL 75%*  Left  lateral flexion (25) WNL (slower to access ROM)* WNL*  Right rotation (30) WNL WNL  Left rotation (30) WNL WNL       Hip Right Left   Flexion (125) 100* 120   Extension (15)     Abduction (40) 40    Adduction      Internal Rotation (45) 20*    External Rotation (45) WNL*         (* = pain; Blank rows = not tested)  LE MMT: MMT (out of 5) Right 11/01/23 Left 11/01/23  Hip flexion 5 4  Hip extension    Hip abduction 4-* 4-  Hip adduction 5 5  Hip internal rotation    Hip external rotation    Knee flexion 5 5  Knee extension 5 5  Ankle dorsiflexion    Ankle plantarflexion    Ankle inversion    Ankle eversion    (* = pain; Blank rows = not tested)  Muscle Length Hamstrings: R: Positive L: Positive E[place] (quadriceps): R: Not examined L: Not examined   Palpation Location Right Left         Lumbar paraspinals    Quadratus Lumborum 0   Gluteus Maximus 1   Gluteus Medius 2   Deep hip external rotators 2   PSIS    Fortin's Area (SIJ) 2   Greater Trochanter    (Blank rows = not tested) Graded on 0-4 scale (0 = no pain, 1 = pain, 2 = pain with wincing/grimacing/flinching, 3 = pain with withdrawal, 4 = unwilling to allow palpation)  Special Tests Lumbar Radiculopathy and Discogenic: Centralization and Peripheralization (SN 92, -LR 0.12): Not examined Slump (SN 83, -LR 0.32): R: Negative L: Positive SLR (SN 92, -LR 0.29): R: Negative L:  Negative Crossed SLR (SP 90): R: Negative L: Negative General lumbar traction: Moderate relief of gluteal pain  Facet Joint: Extension-Rotation (SN 100, -LR 0.0): R: Negative L: Negative  Lumbar Foraminal Stenosis: Lumbar quadrant (SN 70): R: Negative L: Negative  Hip: FABER (SN 81): R: Negative L: Positive FADIR (SN 94): R: Not examined L: Not examined Hip scour (SN 50): R: Negative L: Positive  SIJ:  Thigh Thrust (SN 88, -LR 0.18) : R: Negative L: Positive Anterior Primary Stress: Positive Posterior Primary Stress:  Positive     TODAY'S TREATMENT: DATE: 12/29/2023   SUBJECTIVE STATEMENT:   Pt reports she is benefiting from knowing what stretches/exercises she should be doing. Pt reports some challenges with fear avoidance for some time prior to beginning PT. Pt reports gluteal region is feeling "okay" at arrival. She reports pain mainly along L low back region - "left hip is always cranky." Patient reports ongoing challenges lifting.     *  GOAL UPDATE PERFORMED    Therapeutic Exercise - for improved soft tissue flexibility and extensibility as needed for ROM, improved strength as needed to improve performance of CKC activities/functional movements  NuStep; Level 3, x 5 minutes - for improved soft tissue mobility and increased tissue temperature to improve muscle performance   -subjective gathered during this time   Repeated extension in lying, prone press-up; 1 x 10  Repeated extension in lying, prone press-up; 1 x 10, patient overpressure     Lower trunk rotation; 1x15 R/L   PATIENT EDUCATION: discussed progress with PT, prognosis, and PT plan of care   *not today* Open book; 1 x 10 on R and L Bridge; 2 x 10, 3 sec hold at top Repeated extension in lying, pt leaning on table; 1x10 Piriformis stretch; reviewed Lower trunk rotations; 1 x 10 alternating R/L Supine active hamstring stretch (nerve floss technique); reviewed Cat Camel; 1 x 10 alternating up/down    Manual Therapy - for symptom modulation, soft tissue sensitivity and mobility, joint mobility, ROM    L long-leg distraction, intermittent 10-sec holds; x 3 minutes   -no relief today; ongoing L gluteal/iliolumbar pain STM along L>R L4-S1 longissimus lumborum, emphasis on L>R lumbar paraspinals ; x 10 minutes   *not today* Manual lumbar traction, 10 sec intermittent holds; x 5 min  Prone CPA L4-5, gr II for pain control; 2 x 30 sec bouts   Trigger Point Dry Needling  Subsequent Treatment: Instructions provided previously  at initial dry needling treatment.   Patient Verbal Consent Given: Yes Education Handout Provided: Previously Provided Muscles Treated: L iliocostalis lumborum at L3-4 and L5 levels, L multifidus at L4 level with 0.30 x 70 mm needles Electrical Stimulation Performed: No Treatment Response/Outcome: Moderate post-treatment soreness        PATIENT EDUCATION:  Education details: see above for patient education details Person educated: Patient Education method: Explanation, Demonstration, and Handouts Education comprehension: verbalized understanding and returned demonstration   HOME EXERCISE PROGRAM:  Access Code: [email or web address] URL: [email or web address] Date: 11/08/2023 Prepared by: Denese Finn  Exercises - Standing Lumbar Extension with Counter  - 3 x daily - 7 x weekly - 2 sets - 10 reps - 1 hold - Hooklying Lumbar Traction  - 2 x daily - 7 x weekly - 10 reps - 5-10sec hold - Supine Piriformis Stretch with Foot on Ground  - 2 x daily - 7 x weekly - 3 sets - 30sec hold - Supine Hamstring Stretch  - 2 x daily - 7 x weekly - 2 sets - 10 reps - 1sec hold   ASSESSMENT:  CLINICAL IMPRESSION: Patient feels that she has benefited from PT and elected to cancel steroid injection/ESI for lumbar spine. She has responded well with dry needling and does have mitigation of lower quarter pain with repeated extension. We modestly progressed repeated extension to include patient overpressure; symptoms to remain after repeated movement, though they are modestly improved. Pt has modestly improved forward flexion of thoracolumbar spine, but overall ROM is still quite limited. NPRS has improved up to at least MCID. Pt can perform box lift with 20 lbs; lifting 30 lbs to simulate moving boxes of her work supplies is not completed due to pain with initiating this movement; pt has poor body mechanics and does need work on improved technique with lifting. mODI has improved > MCID per re-testing  today.  Patient has remaining deficits in thoracolumbar flexion/extension and L lateral flexion ROM, gait changes, gluteal strength, and taut/tender  gluteal and deep hip ER mm. Pt will continue to benefit from skilled PT services to address deficits and improve function.   OBJECTIVE IMPAIRMENTS: Abnormal gait, difficulty walking, decreased ROM, decreased strength, impaired flexibility, and pain.   ACTIVITY LIMITATIONS: carrying, lifting, bending, squatting, sleeping, stairs, transfers, and bed mobility  PARTICIPATION LIMITATIONS: meal prep, cleaning, laundry, shopping, community activity, and occupation  PERSONAL FACTORS: Past/current experiences, Time since onset of injury/illness/exacerbation, and 1-2+ comorbidities: (HLD, migraine disorder) are also affecting patient's functional outcome.   REHAB POTENTIAL: Good  CLINICAL DECISION MAKING: Unstable/unpredictable  EVALUATION COMPLEXITY: High   GOALS: Goals reviewed with patient? Yes  SHORT TERM GOALS: Target date: 11/24/2023  Pt will be independent with HEP in order to improve strength and decrease back pain to improve pain-free function at home and work. Baseline: 11/01/23: Baseline HEP initiated/MedBridge handout given.   12/29/23: Pt reports compliance with HEP and verbalizes understanding of given exercises.  Goal status: ACHIEVED   LONG TERM GOALS: Target date: 12/15/2023  Patient will have full thoracolumbar AROM without reproduction of pain as needed for reaching items on ground, household chores, bending. Baseline: 11/01/22: Pain and motion loss with flexion and extension, pain with L lateral flexion.    12/29/23: Modest improvement with flexion ROM tolerated, motion loss with extension, pain with lateral flexion L.  Goal status: NOT MET   2.  Pt will decrease worst back pain by at least 2 points on the NPRS in order to demonstrate clinically significant reduction in back pain. Baseline: 11/01/22: 10/10 pain at worst.   12/29/23: 7/10  at worst.  Goal status: ACHIEVED   3.  Pt will decrease mODI score by at least 13 points in order demonstrate clinically significant reduction in back pain/disability.       Baseline: 11/01/22: 20/50 = 40%.     12/29/23: 11/50 = 22% Goal status: ACHIEVED  4.  Patient will demonstrate box lift with 30 lbs from floor to waist without increase in pain > 1-2/10 and proper body mechanics indicative of ability to maintain physical demands of her work.  Baseline: 11/01/22: Notable pain with lifting, reaching to floor.   12/29/23: Able to lift 20-lbs; pt needs verbal cueing/correction on body mechanics; pain in low back Goal status: NOT MET    PLAN: PT FREQUENCY: 1-2x/week  PT DURATION: 3-4 weeks   PLANNED INTERVENTIONS: Therapeutic exercises, Therapeutic activity, Neuromuscular re-education, Balance training, Gait training, Patient/Family education, Self Care, Joint mobilization, Joint manipulation, Vestibular training, Canalith repositioning, Orthotic/Fit training, DME instructions, Dry Needling, Electrical stimulation, Spinal manipulation, Spinal mobilization, Cryotherapy, Moist heat, Taping, Traction, Ultrasound, Ionotophoresis 4mg /ml Dexamethasone , Manual therapy, and Re-evaluation.  PLAN FOR NEXT SESSION: STM for gluteal mm; dry needling at future visits prn. Progress with gluteal strengthening/trunk stabilization as tolerated. F/u on response with repeated extension.    Denese Finn, PT, DPT #[id number] Aleatha Hunting, PT 12/29/2023, 10:14 AM

## 2024-01-02 ENCOUNTER — Encounter: Payer: Self-pay | Admitting: Physical Therapy

## 2024-01-03 ENCOUNTER — Ambulatory Visit: Payer: Self-pay | Admitting: Physical Therapy

## 2024-01-03 DIAGNOSIS — M6281 Muscle weakness (generalized): Secondary | ICD-10-CM

## 2024-01-03 DIAGNOSIS — M25551 Pain in right hip: Secondary | ICD-10-CM | POA: Diagnosis not present

## 2024-01-03 DIAGNOSIS — M5459 Other low back pain: Secondary | ICD-10-CM

## 2024-01-03 DIAGNOSIS — R262 Difficulty in walking, not elsewhere classified: Secondary | ICD-10-CM

## 2024-01-03 NOTE — Therapy (Signed)
 OUTPATIENT PHYSICAL THERAPY TREATMENT  Patient Name: Deborah Brandt MRN: [id number] DOB:04-23-1966, 58 y.o., female Today's Date: 01/03/2024   END OF SESSION:  PT End of Session - 01/03/24 0733     Visit Number 11    Number of Visits 13    Date for PT Re-Evaluation 12/15/23    PT Start Time 0733    PT Stop Time 0814    PT Time Calculation (min) 41 min    Activity Tolerance Patient tolerated treatment well    Behavior During Therapy [facility] for tasks assessed/performed             Past Medical History:  Diagnosis Date   Complication of anesthesia    after laparoscopy(1988) BP dropped.  Low BP after epidurals for childbirth   Diverticulitis    Gestational diabetes 1990, 2001   Heart murmur    at birth. no recent mention   Hepatic steatosis    History of kidney stones    HLD (hyperlipidemia)    Migraine    Motion sickness    boats, back seat of car   Nephrolithiasis    Pneumonia    Sensorineural hearing loss (SNHL) of left ear    Past Surgical History:  Procedure Laterality Date   BREAST BIOPSY Left 08/22/2017   neg, PREDOMINANTLY FATTY BREAST TISSUE   COLON RESECTION SIGMOID     COLONOSCOPY  2004   COLONOSCOPY WITH PROPOFOL  N/A 12/02/2017   Procedure: COLONOSCOPY WITH PROPOFOL ;  Surgeon: Marnee Sink, MD;  Location: [facility];  Service: Endoscopy;  Laterality: N/A;   DIAGNOSTIC LAPAROSCOPY  1988   checking for endometriosis but negative [facility] [place], [place]   LUMBAR EPIDURAL INJECTION  1990 x2   [facility] [place], [place]   POLYPECTOMY  12/02/2017   Procedure: POLYPECTOMY;  Surgeon: Marnee Sink, MD;  Location: [facility];  Service: Endoscopy;;   SINUS EXPLORATION  1998   [place], [place]    TUBAL LIGATION     VAGINAL DELIVERY  1990   [facility] [place], [place]   Patient Active Problem List   Diagnosis Date Noted   Shortness of breath 09/21/2023   Lumbar radiculopathy 07/20/2023   Hip pain  08/04/2022   Shingles 04/21/2021   S/P laparoscopic-assisted sigmoidectomy 12/23/2020   Heart murmur 11/24/2020   Hyperglycemia 04/14/2020   Hearing loss 08/08/2019   Thumb pain 07/26/2018   Rash 07/26/2018   Cough 07/26/2018   Nasal obstruction 04/02/2018   Toe pain 04/02/2018   Obesity, morbid, BMI 40.0-49.9 (HCC) 04/02/2018   HLD (hyperlipidemia) 04/02/2018   Special screening for malignant neoplasms, colon    Rectal polyp    Drug-induced urticaria 09/20/2017   Routine general medical examination at a health care facility 02/08/2017   Abdominal pain, LLQ 01/21/2017   History of migraine 11/24/2016   Renal stones 11/24/2016   Diverticulitis 11/24/2016   Menopause 11/24/2016   Advance care planning 11/24/2016    PCP: Donnie Galea, MD  REFERRING PROVIDER: Ludwig Safer, PA-C  REFERRING DIAG:  [phone] (ICD-10-CM) - Buttock pain  M54.50,G89.29 (ICD-10-CM) - Chronic midline low back pain without sciatica    RATIONALE FOR EVALUATION AND TREATMENT: Rehabilitation  THERAPY DIAG: Bilateral hip pain  Other low back pain  Muscle weakness (generalized)  Difficulty in walking, not elsewhere classified  ONSET DATE: 30 years + of episodic low back pain   FOLLOW-UP APPT SCHEDULED WITH REFERRING PROVIDER: No ; no formal f/u scheduled  PERTINENT HISTORY: Pt is a 58 year old  female with Hx of chronic episodic pain in bilateral buttock and low back. Patient reports pain in buttocks. Pt reports fear of falling secondary to pain. Pt is having cortisone shot in April. She wants to work in garden, but she is unable to at this time due to pain. Patient reports no numbness/tingling/paresthesias. Patient reports difficulty sleeping - this is unusual for her. Pt reports notable sensation of tightness in glutes. Pt is getting married 12/11/23.   PAIN:    Pain Intensity: Present: 8/10, Best: 3/10, Worst: 10/10 Pain location: Gluteal region L>R with pain down to ischial region Pain  Quality: burning, dull, and aching  Radiating: No  Numbness/Tingling: No Focal Weakness: No Aggravating factors: picking up items from ground, lifting boxes up/down Relieving factors: heating pad (not sure if it helps) 24-hour pain behavior: worse in evening/PM, notable pain in AM and difficulty with bending to turn off her fan  History of prior back injury, pain, surgery, or therapy: Yes; longstanding back pain, Hx of epidurals with both pregnancies  Imaging: Yes ;  Narrative & Impression  CLINICAL DATA:  Lumbar radiculopathy. Symptoms persist with greater than 6 weeks of treatment. Low back pain over the last 30 years which is worsening over time   EXAM: MRI LUMBAR SPINE WITHOUT CONTRAST   TECHNIQUE: Multiplanar, multisequence MR imaging of the lumbar spine was performed. No intravenous contrast was administered.   COMPARISON:  Radiography 08/02/2022   FINDINGS: Segmentation:  5 lumbar type vertebral bodies.   Alignment:  Normal   Vertebrae:  No fracture or focal bone lesion.   Conus medullaris and cauda equina: Conus extends to the L1 level. Conus and cauda equina appear normal.   Paraspinal and other soft tissues: Negative   Disc levels:   T10-11: Bilateral facet degeneration. No compressive stenosis. No disc pathology.   T11-12 through L1-2: Normal   L2-3: Degeneration of the disc with desiccation and mild bulging. Slight indentation of the thecal sac but no neural compression. No edematous endplate marrow changes or facet arthritis.   L3-4: Minimal desiccation and bulging of the disc.  No stenosis.   L4-5: Minimal desiccation and bulging of the disc. Minimal facet hypertrophy. No stenosis. The facet arthritis could possibly contribute to low back pain.   L5-S1: No disc abnormality. Bilateral facet osteoarthritis without slippage or encroachment. This could contribute to low back pain.   IMPRESSION: 1. L2-3: Degeneration of the disc with desiccation and  mild bulging. Slight indentation of the thecal sac but no neural compression. 2. L4-5: Minimal desiccation and bulging of the disc. Minimal facet hypertrophy. No stenosis. The facet arthritis could possibly contribute to low back pain. 3. L5-S1: Bilateral facet osteoarthritis without slippage or encroachment. This could contribute to low back pain.    Red flags: Negative for bowel/bladder changes, saddle paresthesia, personal history of cancer, h/o spinal tumors, h/o compression fx, h/o abdominal aneurysm, abdominal pain, chills/fever, night sweats, nausea, vomiting, unrelenting pain, first onset of insidious LBP <20 y/o  -Night pain    PRECAUTIONS: None  WEIGHT BEARING RESTRICTIONS: No  FALLS: Has patient fallen in last 6 months? No  Living Environment Lives with: lives with fiance Lives in: House/apartment Stairs: Yes: Internal: 2 steps; none 2 steps down to den. 4 steps in. Notable difficulty with stair negotiation.  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 Mostly standing, moving items. Lifts up to 30 lbs.  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   Patient Goals: Able to move better without hurting significantly    OBJECTIVE (data from initial evaluation unless otherwise dated):    Patient Surveys  Modified Oswestry 20/50  = 40%  GAIT: Distance walked: 40 ft  Assistive device utilized: None Level of assistance: SBA Comments: Wide BOS, ipsilateral thoracolumbar sidebend during stance phase of gait, mild pelvic drop bilat   AROM AROM (Normal range in degrees) AROM  11/01/23 AROM 12/29/23  Lumbar    Flexion (65) 50%* 75%* (pain in low back/L glute/thigh)  Extension (30) 50% (not hurting, "stiff") 50%  Right lateral flexion (25) WNL 75%*  Left lateral flexion (25) WNL (slower to access ROM)* WNL*  Right  rotation (30) WNL WNL  Left rotation (30) WNL WNL       Hip Right Left   Flexion (125) 100* 120   Extension (15)     Abduction (40) 40    Adduction      Internal Rotation (45) 20*    External Rotation (45) WNL*         (* = pain; Blank rows = not tested)  LE MMT: MMT (out of 5) Right 11/01/23 Left 11/01/23  Hip flexion 5 4  Hip extension    Hip abduction 4-* 4-  Hip adduction 5 5  Hip internal rotation    Hip external rotation    Knee flexion 5 5  Knee extension 5 5  Ankle dorsiflexion    Ankle plantarflexion    Ankle inversion    Ankle eversion    (* = pain; Blank rows = not tested)  Muscle Length Hamstrings: R: Positive L: Positive [place] (quadriceps): R: Not examined L: Not examined   Palpation Location Right Left         Lumbar paraspinals    Quadratus Lumborum 0   Gluteus Maximus 1   Gluteus Medius 2   Deep hip external rotators 2   PSIS    Fortin's Area (SIJ) 2   Greater Trochanter    (Blank rows = not tested) Graded on 0-4 scale (0 = no pain, 1 = pain, 2 = pain with wincing/grimacing/flinching, 3 = pain with withdrawal, 4 = unwilling to allow palpation)  Special Tests Lumbar Radiculopathy and Discogenic: Centralization and Peripheralization (SN 92, -LR 0.12): Not examined Slump (SN 83, -LR 0.32): R: Negative L: Positive SLR (SN 92, -LR 0.29): R: Negative L:  Negative Crossed SLR (SP 90): R: Negative L: Negative General lumbar traction: Moderate relief of gluteal pain  Facet Joint: Extension-Rotation (SN 100, -LR 0.0): R: Negative L: Negative  Lumbar Foraminal Stenosis: Lumbar quadrant (SN 70): R: Negative L: Negative  Hip: FABER (SN 81): R: Negative L: Positive FADIR (SN 94): R: Not examined L: Not examined Hip scour (SN 50): R: Negative L: Positive  SIJ:  Thigh Thrust (SN 88, -LR 0.18) : R: Negative L: Positive Anterior Primary Stress: Positive Posterior Primary Stress: Positive     TODAY'S TREATMENT: DATE: 01/03/2024   SUBJECTIVE  STATEMENT:   Pt reports she is feeling generally well today and denies pain this AM at baseline. She reports feeling that she is near ready to continue with focus on home program/independent management following this week.    OBJECTIVE FINDINGS  AROM Lumbar flexion 80% (pull in hamstrings) Lumbar extension 75% Lateral flexion: R WNL, L WNL* Thoracolumbar rotation: R WNL, L WNL     Therapeutic Exercise - for improved soft tissue flexibility  and extensibility as needed for ROM, improved strength as needed to improve performance of CKC activities/functional movements  NuStep; Level 4, x 5 minutes - for improved soft tissue mobility and increased tissue temperature to improve muscle performance   -subjective gathered during this time   Repeated extension in lying, prone press-up; 2 x 10, patient overpressure      Quadruped sidebend, alternating R and L; 2 x 10   Open book; 1 x 10 on R and L  PATIENT EDUCATION: discussed progress with PT, prognosis, and PT plan of care. HEP update and review.    *not today* Lower trunk rotation; 1x15 R/L Bridge; 2 x 10, 3 sec hold at top Repeated extension in lying, pt leaning on table; 1x10 Piriformis stretch; reviewed Lower trunk rotations; 1 x 10 alternating R/L Supine active hamstring stretch (nerve floss technique); reviewed Cat Camel; 1 x 10 alternating up/down    Manual Therapy - for symptom modulation, soft tissue sensitivity and mobility, joint mobility, ROM   STM along L>R L4-S1 longissimus lumborum, emphasis on L>R lumbar paraspinals ; x 10 minutes   *not today* Manual lumbar traction, 10 sec intermittent holds; x 5 min  Prone CPA L4-5, gr II for pain control; 2 x 30 sec bouts   Trigger Point Dry Needling  Subsequent Treatment: Instructions provided previously at initial dry needling treatment.   Patient Verbal Consent Given: Yes Education Handout Provided: Previously Provided Muscles Treated:  L and R multifidus at L4 and  L5 level with 0.30 x 70 mm needles Electrical Stimulation Performed: No Treatment Response/Outcome: Mild post-treatment soreness; good tolerance of bilateral lateral flexion post-treatment.         PATIENT EDUCATION:  Education details: see above for patient education details Person educated: Patient Education method: Explanation, Demonstration, and Handouts Education comprehension: verbalized understanding and returned demonstration   HOME EXERCISE PROGRAM:  Access Code: [email or web address] URL: [email or web address] Date: 01/03/2024 Prepared by: Denese Finn  Exercises - Prone Press Up  - 5-6 x daily - 7 x weekly - 1 sets - 10 reps - 1sec hold - Hooklying Lumbar Traction  - 2 x daily - 7 x weekly - 10 reps - 5-10sec hold - Supine Piriformis Stretch with Foot on Ground  - 2 x daily - 7 x weekly - 3 sets - 30sec hold - Supine Hamstring Stretch  - 2 x daily - 7 x weekly - 2 sets - 10 reps - 1sec hold - Quadruped Thoracic Lumbar Side Bend  - 1 x daily - 7 x weekly - 2 sets - 10 reps - Supine Bridge  - 1 x daily - 4-5 x weekly - 2 sets - 10 reps - 2sec hold   ASSESSMENT:  CLINICAL IMPRESSION: Patient fortunately has minimal symptoms at baseline this AM. She does have L>R axial and longissmus lumborum region pain along L3-5 levels. Patient has made fair progress to date in spite of persistent pain symptoms and difficulty with symptom modulation early in POC. She has significantly reduced NPRS and modified Oswestry. We will discuss transition to home exercise program following this week per discussion with pt today. Patient has remaining deficits in thoracolumbar flexion/extension and L lateral flexion ROM, gait changes, gluteal strength, and taut/tender gluteal and deep hip ER mm. Pt will continue to benefit from skilled PT services to address deficits and improve function.   OBJECTIVE IMPAIRMENTS: Abnormal gait, difficulty walking, decreased ROM, decreased strength, impaired  flexibility, and pain.   ACTIVITY LIMITATIONS: carrying, lifting, bending, squatting, sleeping,  stairs, transfers, and bed mobility  PARTICIPATION LIMITATIONS: meal prep, cleaning, laundry, shopping, community activity, and occupation  PERSONAL FACTORS: Past/current experiences, Time since onset of injury/illness/exacerbation, and 1-2+ comorbidities: (HLD, migraine disorder) are also affecting patient's functional outcome.   REHAB POTENTIAL: Good  CLINICAL DECISION MAKING: Unstable/unpredictable  EVALUATION COMPLEXITY: High   GOALS: Goals reviewed with patient? Yes  SHORT TERM GOALS: Target date: 11/24/2023  Pt will be independent with HEP in order to improve strength and decrease back pain to improve pain-free function at home and work. Baseline: 11/01/23: Baseline HEP initiated/MedBridge handout given.   12/29/23: Pt reports compliance with HEP and verbalizes understanding of given exercises.  Goal status: ACHIEVED   LONG TERM GOALS: Target date: 12/15/2023  Patient will have full thoracolumbar AROM without reproduction of pain as needed for reaching items on ground, household chores, bending. Baseline: 11/01/22: Pain and motion loss with flexion and extension, pain with L lateral flexion.    12/29/23: Modest improvement with flexion ROM tolerated, motion loss with extension, pain with lateral flexion L.  Goal status: NOT MET   2.  Pt will decrease worst back pain by at least 2 points on the NPRS in order to demonstrate clinically significant reduction in back pain. Baseline: 11/01/22: 10/10 pain at worst.   12/29/23: 7/10 at worst.  Goal status: ACHIEVED   3.  Pt will decrease mODI score by at least 13 points in order demonstrate clinically significant reduction in back pain/disability.       Baseline: 11/01/22: 20/50 = 40%.     12/29/23: 11/50 = 22% Goal status: ACHIEVED  4.  Patient will demonstrate box lift with 30 lbs from floor to waist without increase in pain > 1-2/10 and proper  body mechanics indicative of ability to maintain physical demands of her work.  Baseline: 11/01/22: Notable pain with lifting, reaching to floor.   12/29/23: Able to lift 20-lbs; pt needs verbal cueing/correction on body mechanics; pain in low back Goal status: NOT MET    PLAN: PT FREQUENCY: 1-2x/week  PT DURATION: 3-4 weeks   PLANNED INTERVENTIONS: Therapeutic exercises, Therapeutic activity, Neuromuscular re-education, Balance training, Gait training, Patient/Family education, Self Care, Joint mobilization, Joint manipulation, Vestibular training, Canalith repositioning, Orthotic/Fit training, DME instructions, Dry Needling, Electrical stimulation, Spinal manipulation, Spinal mobilization, Cryotherapy, Moist heat, Taping, Traction, Ultrasound, Ionotophoresis 4mg /ml Dexamethasone , Manual therapy, and Re-evaluation.  PLAN FOR NEXT SESSION: STM for gluteal mm; dry needling at future visits prn. Progress with gluteal strengthening/trunk stabilization as tolerated. F/u on response with repeated extension.    Denese Finn, PT, DPT #[id number] Aleatha Hunting, PT 01/03/2024, 7:37 AM

## 2024-01-05 ENCOUNTER — Ambulatory Visit: Payer: Self-pay | Admitting: Physical Therapy

## 2024-01-07 ENCOUNTER — Encounter: Payer: Self-pay | Admitting: Family Medicine

## 2024-01-09 ENCOUNTER — Encounter: Admitting: Physical Therapy

## 2024-01-09 ENCOUNTER — Telehealth: Payer: Self-pay | Admitting: Family Medicine

## 2024-01-09 NOTE — Telephone Encounter (Signed)
 Please triage patient about possible shingles.  See MyChart message.  Thanks.

## 2024-01-10 NOTE — Telephone Encounter (Signed)
 Patient sent additional message via my chart sent to [facility] to see if he would like us  to call still.  No further action needed at this time.

## 2024-01-10 NOTE — Telephone Encounter (Signed)
 See above message do you still want us  to call patient to check in on her?

## 2024-01-18 ENCOUNTER — Ambulatory Visit: Admitting: Physical Therapy

## 2024-03-15 ENCOUNTER — Encounter: Payer: Self-pay | Admitting: Family Medicine

## 2024-03-20 ENCOUNTER — Other Ambulatory Visit: Payer: Self-pay | Admitting: Family Medicine

## 2024-03-20 DIAGNOSIS — H029 Unspecified disorder of eyelid: Secondary | ICD-10-CM

## 2024-05-11 ENCOUNTER — Other Ambulatory Visit: Payer: Self-pay | Admitting: Family Medicine

## 2024-05-11 DIAGNOSIS — Z1231 Encounter for screening mammogram for malignant neoplasm of breast: Secondary | ICD-10-CM

## 2024-05-24 ENCOUNTER — Encounter: Payer: Self-pay | Admitting: Dermatology

## 2024-05-24 ENCOUNTER — Ambulatory Visit: Admitting: Dermatology

## 2024-05-24 DIAGNOSIS — L821 Other seborrheic keratosis: Secondary | ICD-10-CM | POA: Diagnosis not present

## 2024-05-24 NOTE — Progress Notes (Signed)
   Follow-Up Visit   Subjective  Deborah Brandt is a 58 y.o. female who presents for the following: check spot L shoulder, noticed last week, may be growing, no symptoms   The following portions of the chart were reviewed this encounter and updated as appropriate: medications, allergies, medical history  Review of Systems:  No other skin or systemic complaints except as noted in HPI or Assessment and Plan.  Objective  Well appearing patient in no apparent distress; mood and affect are within normal limits.   cA focused examination was performed of the following areas: Left shoulder/neck  Relevant exam findings are noted in the Assessment and Plan.        Assessment & Plan   SEBORRHEIC KERATOSIS L top of shoulder - Stuck-on, waxy, tan-brown papules and/or plaques  - Benign-appearing - Discussed benign etiology and prognosis. - Observe - Call for any changes  SEBORRHEIC KERATOSES    Return for schedule for TBSE .  I, Grayce Saunas, RMA, am acting as scribe for Boneta Sharps, MD .   Documentation: I have reviewed the above documentation for accuracy and completeness, and I agree with the above.  Boneta Sharps, MD

## 2024-05-24 NOTE — Patient Instructions (Addendum)
 Seborrheic Keratosis  What causes seborrheic keratoses? Seborrheic keratoses are harmless, common skin growths that first appear during adult life.  As time goes by, more growths appear.  Some people may develop a large number of them.  Seborrheic keratoses appear on both covered and uncovered body parts.  They are not caused by sunlight.  The tendency to develop seborrheic keratoses can be inherited.  They vary in color from skin-colored to gray, brown, or even black.  They can be either smooth or have a rough, warty surface.   Seborrheic keratoses are superficial and look as if they were stuck on the skin.  Under the microscope this type of keratosis looks like layers upon layers of skin.  That is why at times the top layer may seem to fall off, but the rest of the growth remains and re-grows.    Treatment Seborrheic keratoses do not need to be treated, but can easily be removed in the office.  Seborrheic keratoses often cause symptoms when they rub on clothing or jewelry.  Lesions can be in the way of shaving.  If they become inflamed, they can cause itching, soreness, or burning.  Removal of a seborrheic keratosis can be accomplished by freezing, burning, or surgery. If any spot bleeds, scabs, or grows rapidly, please return to have it checked, as these can be an indication of a skin cancer.  Due to recent changes in healthcare laws, you may see results of your pathology and/or laboratory studies on MyChart before the doctors have had a chance to review them. We understand that in some cases there may be results that are confusing or concerning to you. Please understand that not all results are received at the same time and often the doctors may need to interpret multiple results in order to provide you with the best plan of care or course of treatment. Therefore, we ask that you please give us  2 business days to thoroughly review all your results before contacting the office for clarification. Should  we see a critical lab result, you will be contacted sooner.   If You Need Anything After Your Visit  If you have any questions or concerns for your doctor, please call our main line at [phone] and press option 4 to reach your doctor's medical assistant. If no one answers, please leave a voicemail as directed and we will return your call as soon as possible. Messages left after 4 pm will be answered the following business day.   You may also send us  a message via MyChart. We typically respond to MyChart messages within 1-2 business days.  For prescription refills, please ask your pharmacy to contact our office. Our fax number is [phone].  If you have an urgent issue when the clinic is closed that cannot wait until the next business day, you can page your doctor at the number below.    Please note that while we do our best to be available for urgent issues outside of office hours, we are not available 24/7.   If you have an urgent issue and are unable to reach us , you may choose to seek medical care at your doctor's office, retail clinic, urgent care center, or emergency room.  If you have a medical emergency, please immediately call 911 or go to the emergency department.  Pager Numbers  - Dr. Hester: [phone]  - Dr. Jackquline: [phone]  - Dr. Claudene: [phone]   - Dr. Raymund: [phone]  In the event of inclement weather,  please call our main line at [phone] for an update on the status of any delays or closures.  Dermatology Medication Tips: Please keep the boxes that topical medications come in in order to help keep track of the instructions about where and how to use these. Pharmacies typically print the medication instructions only on the boxes and not directly on the medication tubes.   If your medication is too expensive, please contact our office at [phone] option 4 or send us  a message through MyChart.   We are unable to tell what your co-pay  for medications will be in advance as this is different depending on your insurance coverage. However, we may be able to find a substitute medication at lower cost or fill out paperwork to get insurance to cover a needed medication.   If a prior authorization is required to get your medication covered by your insurance company, please allow us  1-2 business days to complete this process.  Drug prices often vary depending on where the prescription is filled and some pharmacies may offer cheaper prices.  The website www.goodrx.com contains coupons for medications through different pharmacies. The prices here do not account for what the cost may be with help from insurance (it may be cheaper with your insurance), but the website can give you the price if you did not use any insurance.  - You can print the associated coupon and take it with your prescription to the pharmacy.  - You may also stop by our office during regular business hours and pick up a GoodRx coupon card.  - If you need your prescription sent electronically to a different pharmacy, notify our office through [facility] or by phone at [phone] option 4.     Si Usted Necesita Algo Despus de Su Visita  Tambin puede enviarnos un mensaje a travs de Clinical cytogeneticist. Por lo general respondemos a los mensajes de MyChart en el transcurso de 1 a 2 das hbiles.  Para renovar recetas, por favor pida a su farmacia que se ponga en contacto con nuestra oficina. Randi lakes de fax es [place] [phone].  Si tiene un asunto urgente cuando la clnica est cerrada y que no puede esperar hasta el siguiente da hbil, puede llamar/localizar a su doctor(a) al nmero que aparece a continuacin.   Por favor, tenga en cuenta que aunque hacemos todo lo posible para estar disponibles para asuntos urgentes fuera del horario de [place], no estamos disponibles las 24 horas del da, los 7 [address]  [address] de la [place].   Si tiene un problema urgente y no puede  comunicarse con nosotros, puede optar por buscar atencin mdica  en el consultorio de su doctor(a), en una clnica privada, en un centro de atencin urgente o en una sala de emergencias.  Si tiene Engineer, drilling, por favor llame inmediatamente al 911 o vaya a la sala de emergencias.  Nmeros de bper  - Dr. Hester: [phone]  - Dra. Jackquline: [phone]  - Dr. Claudene: [phone]  - Dra. Kitts: [phone]  En caso de inclemencias del [place], por favor llame a nuestra lnea principal al [phone] para una actualizacin sobre el estado de cualquier retraso o cierre.  Consejos para la medicacin en dermatologa: Por favor, guarde las cajas en las que vienen los medicamentos de uso tpico para ayudarle a seguir las instrucciones sobre dnde y cmo usarlos. Las farmacias generalmente imprimen las instrucciones del medicamento slo en las cajas y no directamente en los tubos del [place].   Si  su medicamento es muy caro, por favor, pngase en contacto con landry rieger llamando al [phone] y presione la opcin 4 o envenos un mensaje a travs de Clinical cytogeneticist.   No podemos decirle cul ser su copago por los medicamentos por adelantado ya que esto es diferente dependiendo de la cobertura de su seguro. Sin embargo, es posible que podamos encontrar un medicamento sustituto a Audiological scientist un formulario para que el seguro cubra el medicamento que se considera necesario.   Si se requiere una autorizacin previa para que su compaa de seguros malta su medicamento, por favor permtanos de 1 a 2 das hbiles para completar este proceso.  Los precios de los medicamentos varan con frecuencia dependiendo del Environmental consultant de dnde se surte la receta y alguna farmacias pueden ofrecer precios ms baratos.  El sitio web www.goodrx.com tiene cupones para medicamentos de Health and safety inspector. Los precios aqu no tienen en cuenta lo que podra costar con la ayuda del seguro (puede ser ms  barato con su seguro), pero el sitio web puede darle el precio si no utiliz Tourist information centre manager.  - Puede imprimir el cupn correspondiente y llevarlo con su receta a la farmacia.  - Tambin puede pasar por nuestra oficina durante el horario de atencin regular y Education officer, museum una tarjeta de cupones de GoodRx.  - Si necesita que su receta se enve electrnicamente a una farmacia diferente, informe a nuestra oficina a travs de MyChart de  o por telfono llamando al [phone] y presione la opcin 4.

## 2024-06-13 ENCOUNTER — Ambulatory Visit
Admission: RE | Admit: 2024-06-13 | Discharge: 2024-06-13 | Disposition: A | Discharge status: Home / Self Care | Source: Ambulatory Visit | Attending: Family Medicine | Admitting: Family Medicine

## 2024-06-13 DIAGNOSIS — Z1231 Encounter for screening mammogram for malignant neoplasm of breast: Secondary | ICD-10-CM | POA: Insufficient documentation

## 2024-06-17 ENCOUNTER — Ambulatory Visit: Payer: Self-pay | Admitting: Family Medicine

## 2024-07-03 ENCOUNTER — Ambulatory Visit: Admitting: Dermatology

## 2024-07-03 ENCOUNTER — Encounter: Payer: Self-pay | Admitting: Dermatology

## 2024-07-03 DIAGNOSIS — D229 Melanocytic nevi, unspecified: Secondary | ICD-10-CM

## 2024-07-03 DIAGNOSIS — W908XXA Exposure to other nonionizing radiation, initial encounter: Secondary | ICD-10-CM

## 2024-07-03 DIAGNOSIS — L814 Other melanin hyperpigmentation: Secondary | ICD-10-CM

## 2024-07-03 DIAGNOSIS — Z1283 Encounter for screening for malignant neoplasm of skin: Secondary | ICD-10-CM

## 2024-07-03 DIAGNOSIS — L578 Other skin changes due to chronic exposure to nonionizing radiation: Secondary | ICD-10-CM

## 2024-07-03 DIAGNOSIS — D1801 Hemangioma of skin and subcutaneous tissue: Secondary | ICD-10-CM

## 2024-07-03 DIAGNOSIS — L821 Other seborrheic keratosis: Secondary | ICD-10-CM

## 2024-07-03 NOTE — Patient Instructions (Signed)

## 2024-07-03 NOTE — Progress Notes (Signed)
   Follow-Up Visit   Subjective  Deborah Brandt is a 58 y.o. female who presents for the following: Skin Cancer Screening and Full Body Skin Exam  The patient presents for Total-Body Skin Exam (TBSE) for skin cancer screening and mole check. The patient has spots, moles and lesions to be evaluated, some may be new or changing and the patient may have concern these could be cancer.  No hx skin cancer.   The following portions of the chart were reviewed this encounter and updated as appropriate: medications, allergies, medical history  Review of Systems:  No other skin or systemic complaints except as noted in HPI or Assessment and Plan.  Objective  Well appearing patient in no apparent distress; mood and affect are within normal limits.  A full examination was performed including scalp, head, eyes, ears, nose, lips, neck, chest, axillae, abdomen, back, buttocks, bilateral upper extremities, bilateral lower extremities, hands, feet, fingers, toes, fingernails, and toenails. All findings within normal limits unless otherwise noted below.   Exam of nails limited by presence of nail polish.  Relevant physical exam findings are noted in the Assessment and Plan.    Assessment & Plan   SKIN CANCER SCREENING PERFORMED TODAY.  ACTINIC DAMAGE - Chronic condition, secondary to cumulative UV/sun exposure - diffuse scaly erythematous macules with underlying dyspigmentation - Recommend daily broad spectrum sunscreen SPF 30+ to sun-exposed areas, reapply every 2 hours as needed.  - Staying in the shade or wearing long sleeves, sun glasses (UVA+UVB protection) and wide brim hats (4-inch brim around the entire circumference of the hat) are also recommended for sun protection.  - Call for new or changing lesions.  LENTIGINES, SEBORRHEIC KERATOSES, HEMANGIOMAS - Benign normal skin lesions - Benign-appearing - Call for any changes  MELANOCYTIC NEVI - Tan-brown and/or pink-flesh-colored symmetric  macules and papules - Benign appearing on exam today - Observation - Call clinic for new or changing moles - Recommend daily use of broad spectrum spf 30+ sunscreen to sun-exposed areas.       MULTIPLE BENIGN NEVI   LENTIGINES   ACTINIC ELASTOSIS   SEBORRHEIC KERATOSES   CHERRY ANGIOMA   Return in about 1 year (around 07/03/2025) for TBSE, with Dr. Claudene.  LILLETTE Lonell Drones, RMA, am acting as scribe for Boneta Claudene, MD .   Documentation: I have reviewed the above documentation for accuracy and completeness, and I agree with the above.  Boneta Claudene, MD

## 2025-07-04 ENCOUNTER — Ambulatory Visit: Admitting: Dermatology
